# Patient Record
Sex: Male | Born: 1965 | Race: White | Hispanic: No | Smoking: Never smoker
Health system: Southern US, Community
[De-identification: ages and names within clinical notes are randomized; demographics above are authoritative.]

## PROBLEM LIST (undated history)

## (undated) DIAGNOSIS — N529 Male erectile dysfunction, unspecified: Secondary | ICD-10-CM

## (undated) DIAGNOSIS — R0602 Shortness of breath: Secondary | ICD-10-CM

## (undated) DIAGNOSIS — R5383 Other fatigue: Secondary | ICD-10-CM

## (undated) DIAGNOSIS — E291 Testicular hypofunction: Secondary | ICD-10-CM

## (undated) DIAGNOSIS — I429 Cardiomyopathy, unspecified: Secondary | ICD-10-CM

## (undated) DIAGNOSIS — E559 Vitamin D deficiency, unspecified: Secondary | ICD-10-CM

## (undated) DIAGNOSIS — L03115 Cellulitis of right lower limb: Secondary | ICD-10-CM

## (undated) DIAGNOSIS — R252 Cramp and spasm: Secondary | ICD-10-CM

## (undated) DIAGNOSIS — L02415 Cutaneous abscess of right lower limb: Secondary | ICD-10-CM

## (undated) DIAGNOSIS — I10 Essential (primary) hypertension: Secondary | ICD-10-CM

## (undated) DIAGNOSIS — E669 Obesity, unspecified: Secondary | ICD-10-CM

## (undated) DIAGNOSIS — M25469 Effusion, unspecified knee: Secondary | ICD-10-CM

## (undated) DIAGNOSIS — M1990 Unspecified osteoarthritis, unspecified site: Secondary | ICD-10-CM

## (undated) DIAGNOSIS — R7303 Prediabetes: Secondary | ICD-10-CM

## (undated) DIAGNOSIS — G4733 Obstructive sleep apnea (adult) (pediatric): Secondary | ICD-10-CM

## (undated) DIAGNOSIS — I499 Cardiac arrhythmia, unspecified: Secondary | ICD-10-CM

## (undated) DIAGNOSIS — M2550 Pain in unspecified joint: Secondary | ICD-10-CM

## (undated) DIAGNOSIS — E119 Type 2 diabetes mellitus without complications: Secondary | ICD-10-CM

## (undated) DIAGNOSIS — E538 Deficiency of other specified B group vitamins: Secondary | ICD-10-CM

## (undated) DIAGNOSIS — M791 Myalgia, unspecified site: Secondary | ICD-10-CM

## (undated) DIAGNOSIS — I4891 Unspecified atrial fibrillation: Secondary | ICD-10-CM

## (undated) HISTORY — DX: Cramp and spasm: R25.2

## (undated) HISTORY — DX: Deficiency of other specified B group vitamins: E53.8

## (undated) HISTORY — DX: Cardiomyopathy, unspecified: I42.9

## (undated) HISTORY — DX: Unspecified atrial fibrillation: I48.91

## (undated) HISTORY — DX: Male erectile dysfunction, unspecified: N52.9

## (undated) HISTORY — PX: EYE SURGERY: SHX253

## (undated) HISTORY — DX: Obesity, unspecified: E66.9

## (undated) HISTORY — DX: Effusion, unspecified knee: M25.469

## (undated) HISTORY — DX: Myalgia, unspecified site: M79.10

## (undated) HISTORY — DX: Pain in unspecified joint: M25.50

## (undated) HISTORY — DX: Vitamin D deficiency, unspecified: E55.9

## (undated) HISTORY — DX: Other fatigue: R53.83

## (undated) HISTORY — DX: Prediabetes: R73.03

## (undated) HISTORY — DX: Type 2 diabetes mellitus without complications: E11.9

## (undated) HISTORY — DX: Testicular hypofunction: E29.1

## (undated) HISTORY — DX: Obstructive sleep apnea (adult) (pediatric): G47.33

## (undated) HISTORY — DX: Shortness of breath: R06.02

## (undated) HISTORY — DX: Essential (primary) hypertension: I10

---
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
## 2000-06-19 ENCOUNTER — Emergency Department (HOSPITAL_COMMUNITY): Admission: EM | Admit: 2000-06-19 | Discharge: 2000-06-19 | Payer: Self-pay | Admitting: Emergency Medicine

## 2003-08-12 ENCOUNTER — Emergency Department (HOSPITAL_COMMUNITY): Admission: EM | Admit: 2003-08-12 | Discharge: 2003-08-12 | Payer: Self-pay | Admitting: Emergency Medicine

## 2006-05-13 ENCOUNTER — Ambulatory Visit: Payer: Self-pay | Admitting: Internal Medicine

## 2006-07-08 ENCOUNTER — Ambulatory Visit: Payer: Self-pay | Admitting: Internal Medicine

## 2006-07-15 ENCOUNTER — Ambulatory Visit: Payer: Self-pay | Admitting: Internal Medicine

## 2007-01-01 ENCOUNTER — Ambulatory Visit: Payer: Self-pay | Admitting: Family Medicine

## 2008-02-08 ENCOUNTER — Ambulatory Visit: Payer: Self-pay | Admitting: Internal Medicine

## 2008-02-08 DIAGNOSIS — R0609 Other forms of dyspnea: Secondary | ICD-10-CM

## 2008-02-08 DIAGNOSIS — I10 Essential (primary) hypertension: Secondary | ICD-10-CM | POA: Insufficient documentation

## 2008-02-08 DIAGNOSIS — R0989 Other specified symptoms and signs involving the circulatory and respiratory systems: Secondary | ICD-10-CM | POA: Insufficient documentation

## 2008-06-14 ENCOUNTER — Ambulatory Visit: Payer: Self-pay | Admitting: Internal Medicine

## 2008-06-14 LAB — CONVERTED CEMR LAB
ALT: 35 units/L (ref 0–53)
AST: 25 units/L (ref 0–37)
Basophils Absolute: 0 10*3/uL (ref 0.0–0.1)
Basophils Relative: 0.2 % (ref 0.0–3.0)
CO2: 30 meq/L (ref 19–32)
Chloride: 105 meq/L (ref 96–112)
Creatinine, Ser: 1.1 mg/dL (ref 0.4–1.5)
Eosinophils Relative: 1.6 % (ref 0.0–5.0)
LDL Cholesterol: 89 mg/dL (ref 0–99)
Leukocytes, UA: NEGATIVE
Lymphocytes Relative: 23 % (ref 12.0–46.0)
MCHC: 34.9 g/dL (ref 30.0–36.0)
Monocytes Relative: 7.8 % (ref 3.0–12.0)
Neutrophils Relative %: 67.4 % (ref 43.0–77.0)
Nitrite: NEGATIVE
RBC: 4.9 M/uL (ref 4.22–5.81)
Specific Gravity, Urine: 1.02 (ref 1.000–1.03)
TSH: 2.28 microintl units/mL (ref 0.35–5.50)
Total Bilirubin: 1.1 mg/dL (ref 0.3–1.2)
Total CHOL/HDL Ratio: 4
Urobilinogen, UA: 0.2 (ref 0.0–1.0)
WBC: 4.5 10*3/uL (ref 4.5–10.5)

## 2008-06-19 ENCOUNTER — Ambulatory Visit: Payer: Self-pay | Admitting: Internal Medicine

## 2009-02-26 ENCOUNTER — Telehealth: Payer: Self-pay | Admitting: Internal Medicine

## 2010-03-17 ENCOUNTER — Telehealth (INDEPENDENT_AMBULATORY_CARE_PROVIDER_SITE_OTHER): Payer: Self-pay | Admitting: *Deleted

## 2010-03-24 ENCOUNTER — Ambulatory Visit: Payer: Self-pay | Admitting: Internal Medicine

## 2010-03-24 DIAGNOSIS — R5381 Other malaise: Secondary | ICD-10-CM | POA: Insufficient documentation

## 2010-03-24 DIAGNOSIS — I872 Venous insufficiency (chronic) (peripheral): Secondary | ICD-10-CM | POA: Insufficient documentation

## 2010-03-24 DIAGNOSIS — R5383 Other fatigue: Secondary | ICD-10-CM | POA: Insufficient documentation

## 2010-03-24 DIAGNOSIS — R609 Edema, unspecified: Secondary | ICD-10-CM | POA: Insufficient documentation

## 2010-07-26 ENCOUNTER — Emergency Department (HOSPITAL_COMMUNITY): Admission: EM | Admit: 2010-07-26 | Discharge: 2010-07-26 | Payer: Self-pay | Admitting: Emergency Medicine

## 2010-09-15 ENCOUNTER — Ambulatory Visit: Payer: Self-pay | Admitting: Internal Medicine

## 2010-09-16 ENCOUNTER — Encounter: Payer: Self-pay | Admitting: Internal Medicine

## 2010-09-22 ENCOUNTER — Ambulatory Visit: Payer: Self-pay | Admitting: Internal Medicine

## 2010-09-22 ENCOUNTER — Encounter: Payer: Self-pay | Admitting: Internal Medicine

## 2010-09-22 DIAGNOSIS — R7309 Other abnormal glucose: Secondary | ICD-10-CM | POA: Insufficient documentation

## 2010-10-01 ENCOUNTER — Ambulatory Visit: Payer: Self-pay | Admitting: Pulmonary Disease

## 2010-11-04 NOTE — Assessment & Plan Note (Signed)
Summary: f/u appt/#/cd   Vital Signs:  Patient profile:   45 year old male Height:      70 inches (177.80 cm) Weight:      341.50 pounds (155.23 kg) BMI:     49.18 O2 Sat:      95 % on Room air Temp:     96.8 degrees F (36 degrees C) oral Pulse rate:   71 / minute BP sitting:   130 / 84  (left arm) Cuff size:   large  Vitals Entered By: Lucious Groves (March 24, 2010 8:27 AM)  O2 Flow:  Room air CC: follow-up visit/refill micardis./kb Is Patient Diabetic? No Pain Assessment Patient in pain? no        CC:  follow-up visit/refill micardis./kb.  History of Present Illness: F/u HTN, obesity, fatigue  Current Medications (verified): 1)  Micardis Hct 80-12.5 Mg  Tabs (Telmisartan-Hctz) .[name].. 1 Once Daily 2)  Vitamin D3 1000 Unit  Tabs (Cholecalciferol) .[name].. 1 By Mouth Daily  Allergies (verified): 1)  Hydrochlorothiazide  Past History:  Past Medical History: Last updated: 02/08/2008 Hypertension Obesity ED Hypogonadism  Family History: Last updated: 02/08/2008 F MI 42 Cousin MI  Social History: Last updated: 06/19/2008 Occupation: [employer] Single, moved in with girlfriend Never Smoked Regular exercise-yes  Past Surgical History: Denies surgical history  Review of Systems       The patient complains of dyspnea on exertion.  The patient denies weight loss, weight gain, prolonged cough, and melena.         Tired  Physical Exam  General:  overweight-appearing.   Nose:  External nasal examination shows no deformity or inflammation. Nasal mucosa are pink and moist without lesions or exudates. Mouth:  Oral mucosa and oropharynx without lesions or exudates.  Teeth in good repair. Small oropharynx Neck:  No deformities, masses, or tenderness noted. Lungs:  Normal respiratory effort, chest expands symmetrically. Lungs are clear to auscultation, no crackles or wheezes. Heart:  Normal rate and regular rhythm. S1 and S2 normal without gallop, murmur, click, rub or  other extra sounds. Abdomen:  Obese S/NT Msk:  No deformity or scoliosis noted of thoracic or lumbar spine.   Extremities:  trace left pedal edema and trace right pedal edema.   Neurologic:  No cranial nerve deficits noted. Station and gait are normal. Plantar reflexes are down-going bilaterally. DTRs are symmetrical throughout. Sensory, motor and coordinative functions appear intact. Skin:  Intact without suspicious lesions or rashes Hyperpigm ankles   Impression & Recommendations:  Problem # 1:  HYPERTENSION (ICD-401.9) Assessment Unchanged  The following medications were removed from the medication list:    Micardis Hct 80-12.5 Mg Tabs (Telmisartan-hctz) .[name]... 1 once daily His updated medication list for this problem includes:    Hyzaar 100-12.5 Mg Tabs (Losartan potassium-hctz) .[name]... 1 by mouth qd  BP today: 130/84 Prior BP: 120/70 (06/19/2008)  Labs Reviewed: K+: 3.8 (06/14/2008) Creat: : 1.1 (06/14/2008)   Chol: 137 (06/14/2008)   HDL: 34.5 (06/14/2008)   LDL: 89 (06/14/2008)   TG: 69 (06/14/2008)  Problem # 2:  OBESITY, MORBID (ICD-278.01) Assessment: Unchanged See "Patient Instructions".   Problem # 3:  SNORING (ICD-786.09) Assessment: Unchanged Denies OSA. Declined sleep study The following medications were removed from the medication list:    Micardis Hct 80-12.5 Mg Tabs (Telmisartan-hctz) .[name]... 1 once daily His updated medication list for this problem includes:    Hyzaar 100-12.5 Mg Tabs (Losartan potassium-hctz) .[name]... 1 by mouth qd  Problem # 4:  FATIGUE (  ICD-780.79) Assessment: Unchanged Poss due to #2,3 Check testost  Problem # 5:  EDEMA (ICD-782.3) due to #6 Assessment: New  The following medications were removed from the medication list:    Micardis Hct 80-12.5 Mg Tabs (Telmisartan-hctz) .[name]... 1 once daily His updated medication list for this problem includes:    Hyzaar 100-12.5 Mg Tabs (Losartan potassium-hctz) .[name]... 1 by mouth qd  Problem # 6:   VENOUS INSUFFICIENCY, CHRONIC (ICD-459.81) due to #2 Assessment: Deteriorated  Complete Medication List: 1)  Vitamin D3 1000 Unit Tabs (Cholecalciferol) .[name].. 1 by mouth daily 2)  Hyzaar 100-12.5 Mg Tabs (Losartan potassium-hctz) .[name].. 1 by mouth qd 3)  Labs  .... Cbc, tsh, bmet, hepatic panel, ua, lipids dx: v70.0, 401.1  Patient Instructions: 1)  Please schedule a follow-up appointment in 6 months well w/labs. 2)  Try to eat more raw plant food, fresh and dry fruit, raw almonds, leafy vegetables, whole foods and less red meat, less animal fat. Poultry and fish is better for you than pork and beef. Avoid processed foods (canned soups, hot dogs, sausage, bacon , frozen dinners). Avoid corn syrup, high fructose syrup or aspartam  containing drinks. Honey, Agave and Stevia are better sweeteners. Make your own  dressing with olive oil, wine vinegar, lemon juce, garlic etc. for your salads. Prescriptions: LABS CBC, TSH, BMET, Hepatic panel, UA, Lipids Dx: V70.0, 401.1  #1 x 0   Entered and Authorized by:   Tresa Garter MD   Signed by:   Tresa Garter MD on 03/24/2010   Method used:   Print then Give to Patient   RxID:   [id number] HYZAAR 100-12.5 MG TABS (LOSARTAN POTASSIUM-HCTZ) 1 by mouth qd  #90 x 3   Entered and Authorized by:   Tresa Garter MD   Signed by:   Tresa Garter MD on 03/24/2010   Method used:   Electronically to        [facility] [phone]* (retail)       [address]       [place], [place]  [address]       Ph: [phone]       Fax: [phone]   RxID:   [id number]

## 2010-11-04 NOTE — Progress Notes (Signed)
----   Converted from flag ---- ---- 03/17/2010 9:28 AM, Verdell Face wrote: Roque Lias,  Pt out of mycardis,. can he get some to get him through to next appt. [phone] / [facility] [address]  ROCK, SOBOL [place]  [phone] thanks! cheryl ------------------------------

## 2010-11-06 NOTE — Assessment & Plan Note (Signed)
Summary: 6 MTH PHYSICAL--STC   Vital Signs:  Patient profile:   45 year old male Height:      70 inches Weight:      361 pounds BMI:     51.99 Temp:     97.6 degrees F oral Pulse rate:   76 / minute Pulse rhythm:   regular Resp:     16 per minute BP sitting:   130 / 90  (left arm) Cuff size:   large  Vitals Entered By: Lanier Prude, Beverly Gust) (September 22, 2010 8:48 AM) CC: CPX Is Patient Diabetic? No   CC:  CPX.  History of Present Illness: The patient presents for a preventive health examination   Current Medications (verified): 1)  Vitamin D3 1000 Unit  Tabs (Cholecalciferol) .[name].. 1 By Mouth Daily 2)  Hyzaar 100-12.5 Mg Tabs (Losartan Potassium-Hctz) .[name].. 1 By Mouth Qd 3)  Labs .... Cbc, Tsh, Bmet, Hepatic Panel, Ua, Lipids Dx: V70.0, 401.1  Allergies (verified): 1)  Hydrochlorothiazide  Past History:  Past Medical History: Last updated: 02/08/2008 Hypertension Obesity ED Hypogonadism  Past Surgical History: Last updated: 03/24/2010 Denies surgical history  Family History: Last updated: 02/08/2008 F MI 32 Cousin MI  Social History: Occupation: [employer] Got married 08/2010 Never Smoked Regular exercise-yes  Review of Systems       The patient complains of weight gain.  The patient denies anorexia, fever, weight loss, vision loss, decreased hearing, hoarseness, chest pain, syncope, dyspnea on exertion, peripheral edema, prolonged cough, headaches, hemoptysis, abdominal pain, melena, hematochezia, severe indigestion/heartburn, hematuria, incontinence, genital sores, muscle weakness, suspicious skin lesions, transient blindness, difficulty walking, depression, unusual weight change, abnormal bleeding, enlarged lymph nodes, angioedema, and testicular masses.         stress  Physical Exam  General:  overweight-appearing.   Head:  Normocephalic and atraumatic without obvious abnormalities. No apparent alopecia or balding. Eyes:  No corneal or  conjunctival inflammation noted. EOMI. Perrla. Ears:  External ear exam shows no significant lesions or deformities.  Otoscopic examination reveals clear canals, tympanic membranes are intact bilaterally without bulging, retraction, inflammation or discharge. Hearing is grossly normal bilaterally. Nose:  External nasal examination shows no deformity or inflammation. Nasal mucosa are pink and moist without lesions or exudates. Mouth:  Oral mucosa and oropharynx without lesions or exudates.  Teeth in good repair. Neck:  No deformities, masses, or tenderness noted. Breasts:  gynecomastia.   Lungs:  Normal respiratory effort, chest expands symmetrically. Lungs are clear to auscultation, no crackles or wheezes. Heart:  Normal rate and regular rhythm. S1 and S2 normal without gallop, murmur, click, rub or other extra sounds. Abdomen:  Obese S/NT Msk:  No deformity or scoliosis noted of thoracic or lumbar spine.   Extremities:  trace left pedal edema and trace right pedal edema.   Neurologic:  No cranial nerve deficits noted. Station and gait are normal. Plantar reflexes are down-going bilaterally. DTRs are symmetrical throughout. Sensory, motor and coordinative functions appear intact. Skin:  Intact without suspicious lesions or rashes Hyperpigm ankles Cervical Nodes:  No lymphadenopathy noted Psych:  Cognition and judgment appear intact. Alert and cooperative with normal attention span and concentration. No apparent delusions, illusions, hallucinations   Impression & Recommendations:  Problem # 1:  WELL ADULT EXAM (ICD-V70.0) Assessment New The labs were reviewed with the patient.  Health and age related issues were discussed. Available screening tests and vaccinations were discussed as well. Healthy life style including good diet and exercise was discussed.  Orders: EKG  w/ Interpretation (93000) nl  Problem # 2:  OBESITY, MORBID (ICD-278.01) Assessment: Deteriorated  Lap band discussed  again  Orders: Sleep Disorder Referral (Sleep Disorder)  Problem # 3:  HYPERTENSION (ICD-401.9) Assessment: Unchanged  His updated medication list for this problem includes:    Hyzaar 100-12.5 Mg Tabs (Losartan potassium-hctz) .[name]... 1 by mouth qd  BP today: 130/90 Prior BP: 130/84 (03/24/2010)  Labs Reviewed: K+: 3.8 (06/14/2008) Creat: : 1.1 (06/14/2008)   Chol: 137 (06/14/2008)   HDL: 34.5 (06/14/2008)   LDL: 89 (06/14/2008)   TG: 69 (06/14/2008)  Problem # 4:  HYPERGLYCEMIA (ICD-790.29) Assessment: Unchanged  His updated medication list for this problem includes:    Metformin Hcl 500 Mg Tabs (Metformin hcl) .[name]... 1 by mouth once daily to start A1c 5.7%  Problem # 5:  SNORING (ICD-786.09) Assessment: Deteriorated  His updated medication list for this problem includes:    Hyzaar 100-12.5 Mg Tabs (Losartan potassium-hctz) .[name]... 1 by mouth qd  Orders: Sleep Disorder Referral (Sleep Disorder)  Complete Medication List: 1)  Vitamin D3 1000 Unit Tabs (Cholecalciferol) .[name].. 1 by mouth daily 2)  Hyzaar 100-12.5 Mg Tabs (Losartan potassium-hctz) .[name].. 1 by mouth qd 3)  Labs  .... Cbc, tsh, bmet, hepatic panel, ua, lipids dx: v70.0, 401.1 4)  Metformin Hcl 500 Mg Tabs (Metformin hcl) .[name].. 1 by mouth qd  Contraindications/Deferment of Procedures/Staging:    Test/Procedure: FLU VAX    Reason for deferment: patient declined   Patient Instructions: 1)  Please schedule a follow-up appointment in 6 months. Prescriptions: METFORMIN HCL 500 MG TABS (METFORMIN HCL) 1 by mouth qd  #90 x 3   Entered and Authorized by:   Tresa Garter MD   Signed by:   Tresa Garter MD on 09/22/2010   Method used:   Electronically to        [facility] [phone]* (retail)       [address]       [place], [place]  [address]       Ph: [phone]       Fax: [phone]   RxID:   [id number] HYZAAR 100-12.5 MG TABS (LOSARTAN POTASSIUM-HCTZ) 1 by mouth qd   #90 x 3   Entered and Authorized by:   Tresa Garter MD   Signed by:   Tresa Garter MD on 09/22/2010   Method used:   Electronically to        [facility] [phone]* (retail)       [address]       [place], [place]  [address]       Ph: [phone]       Fax: [phone]   RxID:   [id number]    Orders Added: 1)  EKG w/ Interpretation [93000] 2)  Sleep Disorder Referral [Sleep Disorder] 3)  Est. Patient age 63-64 [phone]

## 2010-12-12 ENCOUNTER — Telehealth: Payer: Self-pay | Admitting: Internal Medicine

## 2010-12-16 NOTE — Progress Notes (Signed)
Summary: RF -   Phone Note Refill Request   Refills Requested: Medication #1:  HYZAAR 100-12.5 MG TABS 1 by mouth qd  Medication #2:  METFORMIN HCL 500 MG TABS 1 by mouth qd. [facility] [place] ?   Initial call taken by: Lamar Sprinkles, CMA,  December 12, 2010 2:09 PM    Additional Follow-up for Phone Call Additional follow up Details #2::    ok x 12 months  Follow-up by: Tresa Garter MD,  December 12, 2010 5:46 PM  Prescriptions: METFORMIN HCL 500 MG TABS (METFORMIN HCL) 1 by mouth qd  #90 x 3   Entered by:   Lamar Sprinkles, CMA   Authorized by:   Tresa Garter MD   Signed by:   Lamar Sprinkles, CMA on 12/12/2010   Method used:   Electronically to        [facility]* (mail-order)       [address]       [place], [place]  [address]       Ph: [phone]       Fax: [phone]   RxID:   [id number] LOVFIE 100-12.5 MG TABS (LOSARTAN POTASSIUM-HCTZ) 1 by mouth qd  #90 x 3   Entered by:   Lamar Sprinkles, CMA   Authorized by:   Tresa Garter MD   Signed by:   Lamar Sprinkles, CMA on 12/12/2010   Method used:   Electronically to        [facility] (mail-order)       [address]       [place], [place]  [address]       Ph: [phone]       Fax: [phone]   RxID:   [id number]

## 2011-01-02 ENCOUNTER — Telehealth: Payer: Self-pay | Admitting: *Deleted

## 2011-01-02 DIAGNOSIS — R0683 Snoring: Secondary | ICD-10-CM

## 2011-01-02 MED ORDER — LOSARTAN POTASSIUM-HCTZ 100-12.5 MG PO TABS: 1.0000 | ORAL_TABLET | Freq: Every day | ORAL | Status: DC

## 2011-01-02 MED ORDER — METFORMIN HCL 500 MG PO TABS: 500.0000 mg | ORAL_TABLET | Freq: Every day | ORAL | Status: DC

## 2011-01-02 NOTE — Telephone Encounter (Signed)
Left vm for wife, req were completed

## 2011-01-02 NOTE — Telephone Encounter (Signed)
Ok sleep study

## 2011-01-02 NOTE — Telephone Encounter (Signed)
1. Needs refills of 2 meds to be resent to [facility]. Done 2. Req referral for sleep study. Pt was referred  In the past but ins did not cover.

## 2011-02-05 ENCOUNTER — Ambulatory Visit (HOSPITAL_BASED_OUTPATIENT_CLINIC_OR_DEPARTMENT_OTHER): Payer: 59 | Attending: Internal Medicine

## 2011-02-05 DIAGNOSIS — I491 Atrial premature depolarization: Secondary | ICD-10-CM | POA: Insufficient documentation

## 2011-02-05 DIAGNOSIS — G4733 Obstructive sleep apnea (adult) (pediatric): Secondary | ICD-10-CM | POA: Insufficient documentation

## 2011-02-20 NOTE — Assessment & Plan Note (Signed)
[facility]                             PRIMARY CARE OFFICE NOTE   Dakota Hernandez, Dakota Hernandez                  MRN:          [id number]  DATE:07/15/2006                            DOB:          09-Nov-1965    The patient is a 45 year old male presents for a wellness examination.   PAST MEDICAL HISTORY:  1. Hypertension.  2. Erectile dysfunction.   FAMILY HISTORY:  Father died with MI in his 75s.  One cousin had an MI.  Mother is still living, using a walker likely due to arthritis.   SOCIAL HISTORY:  He is single, works at [employer], does not smoke or drink.   ALLERGIES:  NONE.   CURRENT MEDICATIONS:  1. Micardis/HCT 80/12.5.  2. Cialis p.r.n.   DRUG INTOLERANCE:  HCTZ CAUSED ERECTILE DYSFUNCTION.   REVIEW OF SYSTEMS:  He is trying to loose weight by controlling his  portions.  Denies chest pain or shortness of breath.  No syncope, no  neurologic complaints, denies being depressed.  The rest is negative.   PHYSICAL EXAMINATION:  VITAL SIGNS:  Blood pressure 144/88, pulse 73, temp  96.7, weight is 333 pounds (was 342).  GENERAL:  He is overweight.  HEENT:  Moist mucosa.  NECK:  Supple.  No thyromegaly or bruit.  LUNGS:  Clear to auscultation percussion.  No wheezes or rales.  HEART:  S1 S2.  No murmur.  No gallop.  ABDOMEN:  Soft nontender.  No organomegaly is felt.  LOWER EXTREMITIES:  Without edema.  NEUROLOGIC:  He is alert, oriented, cooperative.  Denies being depressed.  GENITOURINARY:  Testicles normal without atrophy, normal size.  No hernia.  SKIN:  Clear with some scars likely left after furuncles on the abdomen.   LABORATORY:  July 08, 2006, CBC normal.  C-MET normal.  Cholesterol 176,  LDL 124, HDL 33.  TSH normal.  Urinalysis normal.  Testosterone 267.  EKG  today is normal.   ASSESSMENT/PLAN:  1. Normal wellness examination. Age/health related issues discussed.      Healthy lifestyle discussed.  He has started to lose  weight.  I gave      him information about ALLI.  He denies snoring or sleep apnea symptoms.      He is planning to loose more weight.  Repeat exam in 12 months with      labs.  2. Family history of coronary disease.  I asked him to take fish oil 1-2 a      day and aspirin 81 mg a day.  3. Hypogonadism with previously negative workup for a secondary cause      likely related to his obesity.  He is not interested in replacement      therapy.  We will recheck in 1 years.  He will plan to loose more      weight.  4. Hypertension.  Restart blood pressure medication today.            ______________________________  Georgina Quint. Plotnikov, MD      AVP/MedQ  DD:  07/15/2006  DT:  07/16/2006  Job #:  [id number]

## 2011-02-26 ENCOUNTER — Telehealth: Payer: Self-pay | Admitting: *Deleted

## 2011-02-26 DIAGNOSIS — I491 Atrial premature depolarization: Secondary | ICD-10-CM

## 2011-02-26 DIAGNOSIS — G4733 Obstructive sleep apnea (adult) (pediatric): Secondary | ICD-10-CM

## 2011-02-26 NOTE — Telephone Encounter (Signed)
Pt's wife called - Pt has ov coming up and needs written rx? I left vm for pt to call back to clarify if he needs written rx OR only lab order for here??

## 2011-02-26 NOTE — Telephone Encounter (Signed)
Pt returned call. He needs written lab order so he can take to [facility]. Please advise, what labs do you want pt to have?

## 2011-02-26 NOTE — Telephone Encounter (Signed)
1.Ok Labs CBC, TSH Testost Vit D CMET Dx fatigue 2. Abn sleep test -- we will ref to Dr Shelle Iron Thx

## 2011-02-26 NOTE — Procedures (Signed)
NAMEBAY, WAYSON             ACCOUNT NO.:  [id number]  MEDICAL RECORD NO.:  [id number]          PATIENT TYPE:  OUT  LOCATION:  SLEEP CENTER                 FACILITY:  [facility]  PHYSICIAN:  Barbaraann Share, MD,FCCPDATE OF BIRTH:  06-Jun-1967  DATE OF STUDY:  02/05/2011                           NOCTURNAL POLYSOMNOGRAM  REFERRING PHYSICIAN:  [name]  INDICATION FOR STUDY:  Hypersomnia with sleep apnea.  EPWORTH SCORE:  8..  SLEEP ARCHITECTURE:  The patient had a total sleep time of 240 minutes with no slow wave sleep and only 23 minutes of REM.  Sleep onset latency was normal at 24 minutes, and REM onset was dilated 258 minutes.  Sleep efficiency was poor at 63%.  RESPIRATORY DATA:  The patient was found to have 41 apneas and 111 obstructive hypopneas, giving him an apnea/hypopnea index of 38 events per hour.  The events occurred in all body positions, but were most common during supine sleep.  There was moderate snoring noted throughout.  OXYGEN DATA:  There was O2 desaturation as low as 74% with the patient's obstructive events.  CARDIAC DATA:  Occasional PAC noted but no clinically significant arrhythmias were seen.  MOVEMENT/PARASOMNIA:  There were no significant leg jerks or other abnormal behaviors noted.  IMPRESSION/RECOMMENDATIONS: 1. Moderate to severe obstructive sleep apnea/hypopnea syndrome with     an AHI of 38 events per hour and O2 desaturation as low as 74%.     Treatment for this degree of sleep apnea should focus primarily on     weight loss as well as CPAP. 2. Occasional PAC noted, but no clinically significant arrhythmias     were seen.     Barbaraann Share, MD,FCCP Diplomate, American Board of Sleep Medicine Electronically Signed    [facility]/MEDQ  D:  02/26/2011 07:53:31  T:  02/26/2011 19:40:32  Job:  [id number]

## 2011-02-26 NOTE — Telephone Encounter (Signed)
Pending signature, Left vm for pt regarding pickup of order and referral

## 2011-03-05 ENCOUNTER — Encounter: Payer: Self-pay | Admitting: Internal Medicine

## 2011-03-12 ENCOUNTER — Encounter: Payer: Self-pay | Admitting: Pulmonary Disease

## 2011-03-13 ENCOUNTER — Ambulatory Visit (INDEPENDENT_AMBULATORY_CARE_PROVIDER_SITE_OTHER): Payer: 59 | Admitting: Pulmonary Disease

## 2011-03-13 ENCOUNTER — Encounter: Payer: Self-pay | Admitting: Pulmonary Disease

## 2011-03-13 VITALS — BP 112/78 | HR 70 | Temp 97.4°F | Ht 70.0 in | Wt 347.4 lb

## 2011-03-13 DIAGNOSIS — G4733 Obstructive sleep apnea (adult) (pediatric): Secondary | ICD-10-CM | POA: Insufficient documentation

## 2011-03-13 NOTE — Progress Notes (Signed)
  Subjective:    Patient ID: Dakota Hernandez, male    DOB: [birth year], 45 y.o.   MRN: [id number]  HPI The pt is a 44y/o male who I have been asked to see for management of severe osa.  He recently underwent npsg, which showed AHI 38/hr with desat to 74%.  The pt's history is significant for: -loud snoring during the night, but no one has commented on an abnormal breathing pattern during sleep. -denies frequent awakenings, but does have nonrestorative sleep at times.   -occasional sleep pressure during the day with inactivity, and denies sleepiness watching tv or movies in the evenings. -denies sleepiness driving -weight is stable over the last 2 yrs.  Sleep Questionnaire: What time do you typically go to bed?( Between what hours) 11 to 11:30 pm How long does it take you to fall asleep? 5 to 10 mins How many times during the night do you wake up? 2 What time do you get out of bed to start your day? 0600 Do you drive or operate heavy machinery in your occupation? No How much has your weight changed (up or down) over the past two years? (In pounds) Have you ever had a sleep study before? Yes If yes, location of study? [facility] If yes, date of study? May 2012 Do you currently use CPAP? No Do you wear oxygen at any time?     Review of Systems  Constitutional: Negative for fever and unexpected weight change.  HENT: Negative for ear pain, nosebleeds, congestion, sore throat, rhinorrhea, sneezing, trouble swallowing, dental problem, postnasal drip and sinus pressure.   Eyes: Negative for redness and itching.  Respiratory: Negative for cough, chest tightness, shortness of breath and wheezing.   Cardiovascular: Positive for leg swelling. Negative for palpitations.  Gastrointestinal: Negative for nausea and vomiting.  Genitourinary: Negative for dysuria.  Musculoskeletal: Positive for joint swelling.  Skin: Negative for rash.  Neurological: Negative for headaches.  Hematological: Does not bruise/bleed  easily.  Psychiatric/Behavioral: Negative for dysphoric mood. The patient is not nervous/anxious.        Objective:   Physical Exam Constitutional:  Morbidly obese male , no acute distress  HENT:  Nares patent without discharge, turbinate hypertrophy noted  Oropharynx without exudate, very significant soft tissue redundancy posteriorly with elongation of soft palate and  uvula.  Eyes:  Perrla, eomi, no scleral icterus  Neck:  No JVD, no TMG  Cardiovascular:  Normal rate, regular rhythm, no rubs or gallops.  No murmurs        Intact distal pulses  Pulmonary :  Normal breath sounds, no stridor or respiratory distress   No rales, rhonchi, or wheezing  Abdominal:  Soft, nondistended, bowel sounds present.  No tenderness noted.   Musculoskeletal: mild lower extremity edema noted.  Lymph Nodes:  No cervical lymphadenopathy noted  Skin:  No cyanosis noted  Neurologic:  Appears sleepy, moves all 4 extremities without obvious deficit.         Assessment & Plan:

## 2011-03-13 NOTE — Patient Instructions (Signed)
Will start on cpap at moderate pressure level.  Please call if issues with tolerance Work on weight reduction.

## 2011-03-13 NOTE — Assessment & Plan Note (Signed)
The pt has severe osa by his recent sleep study, and I suspect he is much more symptomatic than he is describing.  I have had a long discussion with the pt about sleep apnea, including its impact on QOL and CV health.  I think he would benefit the most from cpap while working on weight loss.  He is considering bariatric surgery to help him lose weight.  I will set the patient up on cpap at a moderate pressure level to allow for desensitization, and will troubleshoot the device over the next 4-6weeks if needed.  The pt is to call me if having issues with tolerance.  Will then optimize the pressure once patient is able to wear cpap on a consistent basis.

## 2011-03-17 ENCOUNTER — Encounter: Payer: Self-pay | Admitting: Internal Medicine

## 2011-03-20 ENCOUNTER — Encounter: Payer: Self-pay | Admitting: Pulmonary Disease

## 2011-03-23 ENCOUNTER — Ambulatory Visit: Payer: Self-pay | Admitting: Internal Medicine

## 2011-03-27 ENCOUNTER — Encounter: Payer: Self-pay | Admitting: Internal Medicine

## 2011-03-27 ENCOUNTER — Ambulatory Visit (INDEPENDENT_AMBULATORY_CARE_PROVIDER_SITE_OTHER): Payer: 59 | Admitting: Internal Medicine

## 2011-03-27 DIAGNOSIS — G4733 Obstructive sleep apnea (adult) (pediatric): Secondary | ICD-10-CM

## 2011-03-27 DIAGNOSIS — E291 Testicular hypofunction: Secondary | ICD-10-CM

## 2011-03-27 DIAGNOSIS — E559 Vitamin D deficiency, unspecified: Secondary | ICD-10-CM | POA: Insufficient documentation

## 2011-03-27 MED ORDER — TESTOSTERONE MICRONIZED CRYS: CRYSTALS | Status: DC

## 2011-03-27 MED ORDER — VITAMIN D3 1.25 MG (50000 UT) PO CAPS: 1.0000 | ORAL_CAPSULE | ORAL | Status: DC

## 2011-03-27 NOTE — Progress Notes (Signed)
  Subjective:    Patient ID: Dakota Hernandez, male    DOB: 29-Jan-1966, 45 y.o.   MRN: [id number]  HPI  F/u abn labs, obesity  Review of Systems  Constitutional: Negative for appetite change, fatigue and unexpected weight change.  HENT: Negative for nosebleeds, congestion, sore throat, sneezing, trouble swallowing and neck pain.   Eyes: Negative for itching and visual disturbance.  Respiratory: Negative for cough.   Cardiovascular: Negative for chest pain, palpitations and leg swelling.  Gastrointestinal: Negative for nausea, diarrhea, blood in stool and abdominal distention.  Genitourinary: Negative for frequency and hematuria.  Musculoskeletal: Negative for back pain, joint swelling and gait problem.  Skin: Negative for rash.  Neurological: Negative for dizziness, tremors, speech difficulty and weakness.  Psychiatric/Behavioral: Negative for sleep disturbance, dysphoric mood and agitation. The patient is not nervous/anxious.        Objective:   Physical Exam  Constitutional: He is oriented to person, place, and time. He appears well-developed. He appears distressed.       Obese  HENT:  Mouth/Throat: Oropharynx is clear and moist.  Eyes: Conjunctivae are normal. Pupils are equal, round, and reactive to light.  Neck: Normal range of motion. No JVD present. No thyromegaly present.  Cardiovascular: Normal rate, regular rhythm, normal heart sounds and intact distal pulses.  Exam reveals no gallop and no friction rub.   No murmur heard. Pulmonary/Chest: Effort normal and breath sounds normal. No respiratory distress. He has no wheezes. He has no rales. He exhibits no tenderness.  Abdominal: Soft. Bowel sounds are normal. He exhibits no distension and no mass. There is no tenderness. There is no rebound and no guarding.  Musculoskeletal: Normal range of motion. He exhibits no edema and no tenderness.  Lymphadenopathy:    He has no cervical adenopathy.  Neurological: He is alert and  oriented to person, place, and time. He has normal reflexes. No cranial nerve deficit. He exhibits normal muscle tone. Coordination normal.  Skin: Skin is warm and dry. No rash noted.  Psychiatric: He has a normal mood and affect. His behavior is normal. Judgment and thought content normal.        Lab Results  Component Value Date   WBC 4.5 06/14/2008   HGB 15.4 06/14/2008   HCT 44.1 06/14/2008   PLT 212 06/14/2008   CHOL 137 06/14/2008   TRIG 69 06/14/2008   HDL 34.5* 06/14/2008   ALT 35 06/14/2008   AST 25 06/14/2008   NA 141 06/14/2008   K 3.8 06/14/2008   CL 105 06/14/2008   CREATININE 1.1 06/14/2008   BUN 10 06/14/2008   CO2 30 06/14/2008   TSH 2.28 06/14/2008     Assessment & Plan:

## 2011-03-27 NOTE — Patient Instructions (Signed)
Labs prior

## 2011-04-02 ENCOUNTER — Encounter: Payer: Self-pay | Admitting: Internal Medicine

## 2011-04-04 ENCOUNTER — Encounter: Payer: Self-pay | Admitting: Internal Medicine

## 2011-04-04 NOTE — Assessment & Plan Note (Signed)
Lap band surgery adviced

## 2011-04-04 NOTE — Assessment & Plan Note (Signed)
On CPAP now

## 2011-04-04 NOTE — Assessment & Plan Note (Signed)
Options to treat discussed. Work up discussed See Meds  Potential benefits of a long term testosterone use as well as potential risks  and complications were explained to the patient and were aknowledged.

## 2011-04-16 ENCOUNTER — Ambulatory Visit: Payer: 59 | Admitting: Pulmonary Disease

## 2011-05-12 ENCOUNTER — Encounter: Payer: Self-pay | Admitting: Pulmonary Disease

## 2011-05-12 ENCOUNTER — Ambulatory Visit (INDEPENDENT_AMBULATORY_CARE_PROVIDER_SITE_OTHER): Payer: 59 | Admitting: Pulmonary Disease

## 2011-05-12 VITALS — BP 132/84 | HR 97 | Temp 97.6°F | Ht 70.0 in | Wt 357.0 lb

## 2011-05-12 DIAGNOSIS — G4733 Obstructive sleep apnea (adult) (pediatric): Secondary | ICD-10-CM

## 2011-05-12 NOTE — Patient Instructions (Signed)
Will get cpap machine set on auto mode for next 2 weeks to optimize your pressure.  Will call with results Work on weight loss followup with me in 6mos.

## 2011-05-16 ENCOUNTER — Encounter: Payer: Self-pay | Admitting: Pulmonary Disease

## 2011-05-16 NOTE — Assessment & Plan Note (Signed)
The pt is doing well with cpap overall, and has seen improvement in his sleep and daytime alertness.  He is suffering from some stress and anxiety currently due to family health issues.  He is working thru mask leak issues.  I have explained to him we need to optimize his pressure, and have encouraged him to work on weight loss.  Care Plan:  At this point, will arrange for the patient's machine to be changed over to auto mode for 2 weeks to optimize their pressure.  I will review the downloaded data once sent by dme, and also evaluate for compliance, leaks, and residual osa.  I will call the patient and dme to discuss the results, and have the patient's machine set appropriately.  This will serve as the pt's cpap pressure titration.

## 2011-05-16 NOTE — Progress Notes (Signed)
  Subjective:    Patient ID: Dakota Hernandez, male    DOB: 1966/02/20, 45 y.o.   MRN: [id number]  HPI The pt comes in today for f/u of his osa.  He was started on cpap last visit at a moderate pressure level, and has done well overall.  He is wearing cpap compliantly, and noticed a big improvement in his sleep.  However, he has been under a lot of stress the past one week due to his wife's illness, and not sleeping as well.  He denies any issue with his pressure, but has had some mask leaks.  He is working thru this.    Review of Systems  Constitutional: Negative for fever and unexpected weight change.  HENT: Negative for ear pain, nosebleeds, congestion, sore throat, rhinorrhea, sneezing, trouble swallowing, dental problem, postnasal drip and sinus pressure.   Eyes: Negative for redness and itching.  Respiratory: Negative for cough, chest tightness, shortness of breath and wheezing.   Cardiovascular: Negative for palpitations and leg swelling.  Gastrointestinal: Negative for nausea and vomiting.  Genitourinary: Negative for dysuria.  Musculoskeletal: Negative for joint swelling.  Skin: Negative for rash.  Neurological: Negative for headaches.  Hematological: Does not bruise/bleed easily.  Psychiatric/Behavioral: Negative for dysphoric mood. The patient is not nervous/anxious.        Objective:   Physical Exam Obese male in nad No skin breakdown or pressure necrosis from cpap mask No purulence or discharge from nares Chest clear LE with no edema, no cyanosis noted.  Alert, does not appear sleepy, moves all 4        Assessment & Plan:

## 2011-05-28 ENCOUNTER — Encounter: Payer: Self-pay | Admitting: Internal Medicine

## 2011-05-28 ENCOUNTER — Ambulatory Visit (INDEPENDENT_AMBULATORY_CARE_PROVIDER_SITE_OTHER): Payer: 59 | Admitting: Internal Medicine

## 2011-05-28 DIAGNOSIS — R5383 Other fatigue: Secondary | ICD-10-CM

## 2011-05-28 DIAGNOSIS — R7309 Other abnormal glucose: Secondary | ICD-10-CM

## 2011-05-28 DIAGNOSIS — I10 Essential (primary) hypertension: Secondary | ICD-10-CM

## 2011-05-28 DIAGNOSIS — R5381 Other malaise: Secondary | ICD-10-CM

## 2011-05-28 DIAGNOSIS — E291 Testicular hypofunction: Secondary | ICD-10-CM

## 2011-05-28 NOTE — Progress Notes (Signed)
  Subjective:    Patient ID: Dakota Hernandez, male    DOB: November 18, 1965, 45 y.o.   MRN: [id number]  HPI  F/u hypogonadism, obesity and OSA  Review of Systems  Constitutional: Positive for fatigue (less tred) and unexpected weight change (lost wt). Negative for appetite change.  HENT: Negative for nosebleeds, congestion, sore throat, sneezing, trouble swallowing and neck pain.   Eyes: Negative for itching and visual disturbance.  Respiratory: Negative for cough.   Cardiovascular: Negative for chest pain, palpitations and leg swelling.  Gastrointestinal: Negative for nausea, diarrhea, blood in stool and abdominal distention.  Genitourinary: Negative for frequency and hematuria.  Musculoskeletal: Negative for back pain, joint swelling and gait problem.  Skin: Negative for rash.  Neurological: Negative for dizziness, tremors, speech difficulty and weakness.  Psychiatric/Behavioral: Negative for sleep disturbance, dysphoric mood and agitation. The patient is not nervous/anxious.    Wt Readings from Last 3 Encounters:  05/28/11 351 lb (159.213 kg)  05/12/11 357 lb (161.934 kg)  03/27/11 348 lb (157.852 kg)       Objective:   Physical Exam  Constitutional: He is oriented to person, place, and time. He appears well-developed.       Obese  HENT:  Mouth/Throat: Oropharynx is clear and moist.  Eyes: Conjunctivae are normal. Pupils are equal, round, and reactive to light.  Neck: Normal range of motion. No JVD present. No thyromegaly present.  Cardiovascular: Normal rate, regular rhythm, normal heart sounds and intact distal pulses.  Exam reveals no gallop and no friction rub.   No murmur heard. Pulmonary/Chest: Effort normal and breath sounds normal. No respiratory distress. He has no wheezes. He has no rales. He exhibits no tenderness.  Abdominal: Soft. Bowel sounds are normal. He exhibits no distension and no mass. There is no tenderness. There is no rebound and no guarding.    Musculoskeletal: Normal range of motion. He exhibits no edema and no tenderness.  Lymphadenopathy:    He has no cervical adenopathy.  Neurological: He is alert and oriented to person, place, and time. He has normal reflexes. No cranial nerve deficit. He exhibits normal muscle tone. Coordination normal.  Skin: Skin is warm and dry. No rash noted.  Psychiatric: He has a normal mood and affect. His behavior is normal. Judgment and thought content normal.     05/19/11: Testost 843 FSH 4.2  Prolactin 12.1     Assessment & Plan:

## 2011-06-01 NOTE — Assessment & Plan Note (Signed)
Glu 84 - better

## 2011-06-01 NOTE — Assessment & Plan Note (Signed)
Preparing for surgery

## 2011-06-01 NOTE — Assessment & Plan Note (Signed)
Doing well on Rx.

## 2011-06-01 NOTE — Assessment & Plan Note (Signed)
Better on testosterone and CPAP

## 2011-06-01 NOTE — Assessment & Plan Note (Signed)
On Rx 

## 2011-06-04 ENCOUNTER — Encounter: Payer: Self-pay | Admitting: Internal Medicine

## 2011-07-23 ENCOUNTER — Ambulatory Visit: Payer: Self-pay | Admitting: Bariatrics

## 2011-09-01 ENCOUNTER — Ambulatory Visit: Payer: Self-pay | Admitting: Bariatrics

## 2011-09-01 DIAGNOSIS — I4891 Unspecified atrial fibrillation: Secondary | ICD-10-CM

## 2011-10-01 ENCOUNTER — Ambulatory Visit (INDEPENDENT_AMBULATORY_CARE_PROVIDER_SITE_OTHER): Payer: 59 | Admitting: Internal Medicine

## 2011-10-01 ENCOUNTER — Encounter: Payer: Self-pay | Admitting: Internal Medicine

## 2011-10-01 DIAGNOSIS — I4891 Unspecified atrial fibrillation: Secondary | ICD-10-CM

## 2011-10-01 DIAGNOSIS — E291 Testicular hypofunction: Secondary | ICD-10-CM

## 2011-10-01 DIAGNOSIS — I10 Essential (primary) hypertension: Secondary | ICD-10-CM

## 2011-10-01 NOTE — Assessment & Plan Note (Signed)
Continue with current prescription therapy as reflected on the Med list.  

## 2011-10-01 NOTE — Assessment & Plan Note (Signed)
12/12 Dr Lady Gary Continue with current prescription therapy as reflected on the Med list.

## 2011-10-01 NOTE — Assessment & Plan Note (Signed)
Not using Rx now

## 2011-10-01 NOTE — Assessment & Plan Note (Signed)
Wt loss surgery was delayed

## 2011-10-01 NOTE — Progress Notes (Signed)
  Subjective:    Patient ID: Dakota Hernandez, male    DOB: Apr 12, 1966, 45 y.o.   MRN: [id number]  HPI  The patient presents for a follow-up of  chronic hypertension, chronic dyslipidemia, type 2 diabetes controlled with medicines  He developed A fib on the day of bypass surgery (09/11/11) - it was cancelled. He is on Coum and Diltiazem now...   Review of Systems  Constitutional: Negative for appetite change, fatigue and unexpected weight change.  HENT: Negative for nosebleeds, congestion, sore throat, sneezing, trouble swallowing and neck pain.   Eyes: Negative for itching and visual disturbance.  Respiratory: Negative for cough.   Cardiovascular: Negative for chest pain, palpitations and leg swelling.  Gastrointestinal: Negative for nausea, diarrhea, blood in stool and abdominal distention.  Genitourinary: Negative for frequency and hematuria.  Musculoskeletal: Negative for back pain, joint swelling and gait problem.  Skin: Negative for rash.  Neurological: Negative for dizziness, tremors, speech difficulty and weakness.  Psychiatric/Behavioral: Negative for sleep disturbance, dysphoric mood and agitation. The patient is not nervous/anxious.    Wt Readings from Last 3 Encounters:  10/01/11 329 lb 12 oz (149.574 kg)  05/28/11 351 lb (159.213 kg)  05/12/11 357 lb (161.934 kg)   BP Readings from Last 3 Encounters:  10/01/11 110/84  05/28/11 138/100  05/12/11 132/84        Objective:   Physical Exam  Constitutional: He is oriented to person, place, and time. He appears well-developed.       Obese   HENT:  Mouth/Throat: Oropharynx is clear and moist.  Eyes: Conjunctivae are normal. Pupils are equal, round, and reactive to light.  Neck: Normal range of motion. No JVD present. No thyromegaly present.  Cardiovascular: Normal rate, regular rhythm, normal heart sounds and intact distal pulses.  Exam reveals no gallop and no friction rub.   No murmur heard. Pulmonary/Chest:  Effort normal and breath sounds normal. No respiratory distress. He has no wheezes. He has no rales. He exhibits no tenderness.  Abdominal: Soft. Bowel sounds are normal. He exhibits no distension and no mass. There is no tenderness. There is no rebound and no guarding.  Musculoskeletal: Normal range of motion. He exhibits no edema and no tenderness.  Lymphadenopathy:    He has no cervical adenopathy.  Neurological: He is alert and oriented to person, place, and time. He has normal reflexes. No cranial nerve deficit. He exhibits normal muscle tone. Coordination normal.  Skin: Skin is warm and dry. No rash noted.  Psychiatric: He has a normal mood and affect. His behavior is normal. Judgment and thought content normal.    Lab Results  Component Value Date   WBC 4.5 06/14/2008   HGB 15.4 06/14/2008   HCT 44.1 06/14/2008   PLT 212 06/14/2008   GLUCOSE 94 06/14/2008   CHOL 137 06/14/2008   TRIG 69 06/14/2008   HDL 34.5* 06/14/2008   LDLCALC 89 06/14/2008   ALT 35 06/14/2008   AST 25 06/14/2008   NA 141 06/14/2008   K 3.8 06/14/2008   CL 105 06/14/2008   CREATININE 1.1 06/14/2008   BUN 10 06/14/2008   CO2 30 06/14/2008   TSH 2.28 06/14/2008         Assessment & Plan:

## 2011-10-26 ENCOUNTER — Other Ambulatory Visit: Payer: Self-pay | Admitting: *Deleted

## 2011-10-26 MED ORDER — LOSARTAN POTASSIUM-HCTZ 100-12.5 MG PO TABS: 1.0000 | ORAL_TABLET | Freq: Every day | ORAL | Status: DC

## 2011-10-26 MED ORDER — METFORMIN HCL 500 MG PO TABS: 500.0000 mg | ORAL_TABLET | Freq: Every day | ORAL | Status: DC

## 2011-11-12 ENCOUNTER — Ambulatory Visit: Payer: 59 | Admitting: Pulmonary Disease

## 2012-01-07 ENCOUNTER — Ambulatory Visit: Payer: Self-pay | Admitting: Cardiology

## 2012-01-07 LAB — PROTIME-INR: INR: 1.9

## 2012-01-08 ENCOUNTER — Other Ambulatory Visit: Payer: Self-pay | Admitting: *Deleted

## 2012-01-08 MED ORDER — METFORMIN HCL 500 MG PO TABS: 500.0000 mg | ORAL_TABLET | Freq: Every day | ORAL | Status: DC

## 2012-01-08 NOTE — Telephone Encounter (Signed)
R'cd fax from [facility] for refill of Metformin.

## 2012-01-14 ENCOUNTER — Other Ambulatory Visit: Payer: Self-pay | Admitting: *Deleted

## 2012-01-14 MED ORDER — LOSARTAN POTASSIUM-HCTZ 100-12.5 MG PO TABS: 1.0000 | ORAL_TABLET | Freq: Every day | ORAL | Status: DC

## 2012-01-14 MED ORDER — METFORMIN HCL 500 MG PO TABS: 500.0000 mg | ORAL_TABLET | Freq: Every day | ORAL | Status: DC

## 2012-01-28 ENCOUNTER — Ambulatory Visit (INDEPENDENT_AMBULATORY_CARE_PROVIDER_SITE_OTHER): Payer: 59 | Admitting: Internal Medicine

## 2012-01-28 ENCOUNTER — Encounter: Payer: Self-pay | Admitting: Internal Medicine

## 2012-01-28 VITALS — BP 128/88 | HR 80 | Temp 97.5°F | Resp 16 | Wt 349.0 lb

## 2012-01-28 DIAGNOSIS — I10 Essential (primary) hypertension: Secondary | ICD-10-CM

## 2012-01-28 DIAGNOSIS — N320 Bladder-neck obstruction: Secondary | ICD-10-CM | POA: Insufficient documentation

## 2012-01-28 DIAGNOSIS — B351 Tinea unguium: Secondary | ICD-10-CM | POA: Insufficient documentation

## 2012-01-28 DIAGNOSIS — G4733 Obstructive sleep apnea (adult) (pediatric): Secondary | ICD-10-CM

## 2012-01-28 DIAGNOSIS — I4891 Unspecified atrial fibrillation: Secondary | ICD-10-CM

## 2012-01-28 DIAGNOSIS — E291 Testicular hypofunction: Secondary | ICD-10-CM

## 2012-01-28 MED ORDER — CICLOPIROX 8 % EX SOLN: Freq: Every day | CUTANEOUS | Status: AC

## 2012-01-28 MED ORDER — LOSARTAN POTASSIUM-HCTZ 100-12.5 MG PO TABS: 1.0000 | ORAL_TABLET | Freq: Every day | ORAL | Status: DC

## 2012-01-28 NOTE — Progress Notes (Signed)
Patient ID: Dakota Hernandez, male   DOB: May 09, 1966, 46 y.o.   MRN: [id number]  Subjective:    Patient ID: Dakota Hernandez, male    DOB: 1966/07/03, 46 y.o.   MRN: [id number]  HPI  The patient presents for a follow-up of  chronic hypertension, chronic dyslipidemia, type 2 diabetes controlled with medicines  He developed A fib on the day of his gastric bypass surgery (09/11/11) - it was cancelled. He is on Coum and Diltiazem now... He ad cardioversion 2 wks ago. C/o toenail fungus  C/o peeing difficulty x 1 wk   Review of Systems  Constitutional: Negative for appetite change, fatigue and unexpected weight change.  HENT: Negative for nosebleeds, congestion, sore throat, sneezing, trouble swallowing and neck pain.   Eyes: Negative for itching and visual disturbance.  Respiratory: Negative for cough.   Cardiovascular: Negative for chest pain, palpitations and leg swelling.  Gastrointestinal: Negative for nausea, diarrhea, blood in stool and abdominal distention.  Genitourinary: Negative for frequency and hematuria.  Musculoskeletal: Negative for back pain, joint swelling and gait problem.  Skin: Negative for rash.  Neurological: Negative for dizziness, tremors, speech difficulty and weakness.  Psychiatric/Behavioral: Negative for sleep disturbance, dysphoric mood and agitation. The patient is not nervous/anxious.    Wt Readings from Last 3 Encounters:  01/28/12 349 lb (158.305 kg)  10/01/11 329 lb 12 oz (149.574 kg)  05/28/11 351 lb (159.213 kg)   BP Readings from Last 3 Encounters:  01/28/12 128/88  10/01/11 110/84  05/28/11 138/100        Objective:   Physical Exam  Constitutional: He is oriented to person, place, and time. He appears well-developed.       Obese   HENT:  Mouth/Throat: Oropharynx is clear and moist.  Eyes: Conjunctivae are normal. Pupils are equal, round, and reactive to light.  Neck: Normal range of motion. No JVD present. No thyromegaly present.    Cardiovascular: Normal rate, regular rhythm, normal heart sounds and intact distal pulses.  Exam reveals no gallop and no friction rub.   No murmur heard. Pulmonary/Chest: Effort normal and breath sounds normal. No respiratory distress. He has no wheezes. He has no rales. He exhibits no tenderness.  Abdominal: Soft. Bowel sounds are normal. He exhibits no distension and no mass. There is no tenderness. There is no rebound and no guarding.  Musculoskeletal: Normal range of motion. He exhibits no edema and no tenderness.  Lymphadenopathy:    He has no cervical adenopathy.  Neurological: He is alert and oriented to person, place, and time. He has normal reflexes. No cranial nerve deficit. He exhibits normal muscle tone. Coordination normal.  Skin: Skin is warm and dry. No rash noted.  Psychiatric: He has a normal mood and affect. His behavior is normal. Judgment and thought content normal.  Onycho B  Lab Results  Component Value Date   WBC 4.5 06/14/2008   HGB 15.4 06/14/2008   HCT 44.1 06/14/2008   PLT 212 06/14/2008   GLUCOSE 94 06/14/2008   CHOL 137 06/14/2008   TRIG 69 06/14/2008   HDL 34.5* 06/14/2008   LDLCALC 89 06/14/2008   ALT 35 06/14/2008   AST 25 06/14/2008   NA 141 06/14/2008   K 3.8 06/14/2008   CL 105 06/14/2008   CREATININE 1.1 06/14/2008   BUN 10 06/14/2008   CO2 30 06/14/2008   TSH 2.28 06/14/2008         Assessment & Plan:

## 2012-01-28 NOTE — Assessment & Plan Note (Signed)
Cont w/CPAP 

## 2012-01-28 NOTE — Assessment & Plan Note (Signed)
12/12 Dr Lady Gary 4/13 - cardioversion - in NSR now Continue with current prescription therapy as reflected on the Med list.

## 2012-01-28 NOTE — Assessment & Plan Note (Signed)
Continue with current prescription therapy as reflected on the Med list.  

## 2012-01-28 NOTE — Assessment & Plan Note (Signed)
[place]

## 2012-01-28 NOTE — Assessment & Plan Note (Signed)
UA and PSA

## 2012-01-28 NOTE — Assessment & Plan Note (Signed)
Surgery is pending 

## 2012-01-28 NOTE — Assessment & Plan Note (Signed)
Off rx ?reason

## 2012-02-22 ENCOUNTER — Encounter: Payer: Self-pay | Admitting: Internal Medicine

## 2012-05-05 HISTORY — PX: LAPAROSCOPIC GASTRIC SLEEVE RESECTION: SHX5895

## 2012-06-02 ENCOUNTER — Ambulatory Visit: Payer: 59 | Admitting: Internal Medicine

## 2012-07-15 ENCOUNTER — Encounter: Payer: Self-pay | Admitting: Internal Medicine

## 2012-07-15 ENCOUNTER — Ambulatory Visit (INDEPENDENT_AMBULATORY_CARE_PROVIDER_SITE_OTHER): Payer: 59 | Admitting: Internal Medicine

## 2012-07-15 VITALS — BP 118/76 | HR 68 | Temp 98.0°F | Resp 16 | Wt 294.0 lb

## 2012-07-15 DIAGNOSIS — G4733 Obstructive sleep apnea (adult) (pediatric): Secondary | ICD-10-CM

## 2012-07-15 DIAGNOSIS — I4821 Permanent atrial fibrillation: Secondary | ICD-10-CM | POA: Insufficient documentation

## 2012-07-15 DIAGNOSIS — E291 Testicular hypofunction: Secondary | ICD-10-CM

## 2012-07-15 DIAGNOSIS — E119 Type 2 diabetes mellitus without complications: Secondary | ICD-10-CM

## 2012-07-15 DIAGNOSIS — E559 Vitamin D deficiency, unspecified: Secondary | ICD-10-CM

## 2012-07-15 DIAGNOSIS — Z23 Encounter for immunization: Secondary | ICD-10-CM

## 2012-07-15 DIAGNOSIS — I4891 Unspecified atrial fibrillation: Secondary | ICD-10-CM

## 2012-07-15 DIAGNOSIS — I10 Essential (primary) hypertension: Secondary | ICD-10-CM

## 2012-07-15 NOTE — Assessment & Plan Note (Signed)
Using med rarely Labs

## 2012-07-15 NOTE — Assessment & Plan Note (Signed)
12/12 Dr Lady Gary 4/13 - cardioversion - in NSR now On Rx

## 2012-07-15 NOTE — Assessment & Plan Note (Signed)
Off rx 

## 2012-07-15 NOTE — Progress Notes (Signed)
Patient ID: Dakota Hernandez, male   DOB: 02-05-1966, 46 y.o.   MRN: [id number] Patient ID: Dakota Hernandez, male   DOB: 07/28/1966, 46 y.o.   MRN: [id number]  Subjective:    Patient ID: Dakota Hernandez, male    DOB: [birth year], 46 y.o.   MRN: [id number]  HPI  He had a sleeve procedure on 05/19/12 The patient presents for a follow-up of  chronic hypertension, chronic dyslipidemia, type 2 diabetes controlled with medicines  He developed A fib on the day of his gastric bypass surgery (09/11/11) - it was cancelled. He is on Coum and Diltiazem now... He had cardioversion 2 wks ago. C/o toenail fungus  C/o peeing difficulty x 1 wk   Review of Systems  Constitutional: Negative for appetite change, fatigue and unexpected weight change.  HENT: Negative for nosebleeds, congestion, sore throat, sneezing, trouble swallowing and neck pain.   Eyes: Negative for itching and visual disturbance.  Respiratory: Negative for cough.   Cardiovascular: Negative for chest pain, palpitations and leg swelling.  Gastrointestinal: Negative for nausea, diarrhea, blood in stool and abdominal distention.  Genitourinary: Negative for frequency and hematuria.  Musculoskeletal: Negative for back pain, joint swelling and gait problem.  Skin: Negative for rash.  Neurological: Negative for dizziness, tremors, speech difficulty and weakness.  Psychiatric/Behavioral: Negative for disturbed wake/sleep cycle, dysphoric mood and agitation. The patient is not nervous/anxious.    Wt Readings from Last 3 Encounters:  07/15/12 294 lb (133.358 kg)  01/28/12 349 lb (158.305 kg)  10/01/11 329 lb 12 oz (149.574 kg)   BP Readings from Last 3 Encounters:  07/15/12 118/76  01/28/12 128/88  10/01/11 110/84        Objective:   Physical Exam  Constitutional: He is oriented to person, place, and time. He appears well-developed.       Obese   HENT:  Mouth/Throat: Oropharynx is clear and moist.  Eyes: Conjunctivae normal  are normal. Pupils are equal, round, and reactive to light.  Neck: Normal range of motion. No JVD present. No thyromegaly present.  Cardiovascular: Normal rate, regular rhythm, normal heart sounds and intact distal pulses.  Exam reveals no gallop and no friction rub.   No murmur heard. Pulmonary/Chest: Effort normal and breath sounds normal. No respiratory distress. He has no wheezes. He has no rales. He exhibits no tenderness.  Abdominal: Soft. Bowel sounds are normal. He exhibits no distension and no mass. There is no tenderness. There is no rebound and no guarding.  Musculoskeletal: Normal range of motion. He exhibits no edema and no tenderness.  Lymphadenopathy:    He has no cervical adenopathy.  Neurological: He is alert and oriented to person, place, and time. He has normal reflexes. No cranial nerve deficit. He exhibits normal muscle tone. Coordination normal.  Skin: Skin is warm and dry. No rash noted.  Psychiatric: He has a normal mood and affect. His behavior is normal. Judgment and thought content normal.  Onycho B  Lab Results  Component Value Date   WBC 4.5 06/14/2008   HGB 15.4 06/14/2008   HCT 44.1 06/14/2008   PLT 212 06/14/2008   GLUCOSE 94 06/14/2008   CHOL 137 06/14/2008   TRIG 69 06/14/2008   HDL 34.5* 06/14/2008   LDLCALC 89 06/14/2008   ALT 35 06/14/2008   AST 25 06/14/2008   NA 141 06/14/2008   K 3.8 06/14/2008   CL 105 06/14/2008   CREATININE 1.1 06/14/2008   BUN 10 06/14/2008   CO2 30  06/14/2008   TSH 2.28 06/14/2008         Assessment & Plan:

## 2012-07-15 NOTE — Assessment & Plan Note (Signed)
S/p gastric sleeve procedure - 8/13 Lost 60 lbs

## 2012-07-15 NOTE — Assessment & Plan Note (Signed)
Not using a CPAP 

## 2012-07-15 NOTE — Assessment & Plan Note (Signed)
Continue with current prescription therapy as reflected on the Med list.  

## 2012-07-15 NOTE — Assessment & Plan Note (Signed)
Likely resolved post wt loss Labs

## 2012-07-17 ENCOUNTER — Encounter: Payer: Self-pay | Admitting: Internal Medicine

## 2012-07-25 ENCOUNTER — Telehealth: Payer: Self-pay | Admitting: *Deleted

## 2012-07-25 NOTE — Telephone Encounter (Signed)
Pt's [facility] labs reviewed by Dr. Posey Rea- His labs are normal except testosterone is low and b 12 is low normal. He want him to start vit b12 1000 SL qd and restart testosterone therapy.  Left mess for patient to call back to inform pt of this. Sent labs to be scanned.

## 2012-07-27 ENCOUNTER — Encounter: Payer: Self-pay | Admitting: Internal Medicine

## 2012-08-04 NOTE — Telephone Encounter (Signed)
Pt informed

## 2013-01-12 ENCOUNTER — Encounter: Payer: Self-pay | Admitting: Internal Medicine

## 2013-01-12 ENCOUNTER — Ambulatory Visit (INDEPENDENT_AMBULATORY_CARE_PROVIDER_SITE_OTHER): Payer: 59 | Admitting: Internal Medicine

## 2013-01-12 ENCOUNTER — Telehealth: Payer: Self-pay | Admitting: *Deleted

## 2013-01-12 VITALS — BP 128/70 | HR 80 | Temp 97.0°F | Resp 16 | Wt 265.0 lb

## 2013-01-12 DIAGNOSIS — I10 Essential (primary) hypertension: Secondary | ICD-10-CM

## 2013-01-12 DIAGNOSIS — G4733 Obstructive sleep apnea (adult) (pediatric): Secondary | ICD-10-CM

## 2013-01-12 DIAGNOSIS — Z0000 Encounter for general adult medical examination without abnormal findings: Secondary | ICD-10-CM

## 2013-01-12 DIAGNOSIS — E291 Testicular hypofunction: Secondary | ICD-10-CM

## 2013-01-12 DIAGNOSIS — N320 Bladder-neck obstruction: Secondary | ICD-10-CM

## 2013-01-12 DIAGNOSIS — R609 Edema, unspecified: Secondary | ICD-10-CM

## 2013-01-12 DIAGNOSIS — R739 Hyperglycemia, unspecified: Secondary | ICD-10-CM

## 2013-01-12 DIAGNOSIS — I872 Venous insufficiency (chronic) (peripheral): Secondary | ICD-10-CM

## 2013-01-12 DIAGNOSIS — E119 Type 2 diabetes mellitus without complications: Secondary | ICD-10-CM

## 2013-01-12 DIAGNOSIS — I4891 Unspecified atrial fibrillation: Secondary | ICD-10-CM

## 2013-01-12 MED ORDER — APIXABAN 5 MG PO TABS: 5.0000 mg | ORAL_TABLET | Freq: Two times a day (BID) | ORAL | Status: DC

## 2013-01-12 NOTE — Progress Notes (Signed)
   Subjective:    HPI  He had a sleeve procedure on 05/19/12  The patient presents for a follow-up of  chronic hypertension, chronic dyslipidemia, type 2 diabetes controlled with medicines  He developed A fib on the day of his gastric bypass surgery (09/11/11) - it was cancelled. He is on Coum and Diltiazem now... He had cardioversion 2 wks ago. C/o toenail fungus     Review of Systems  Constitutional: Negative for appetite change, fatigue and unexpected weight change.  HENT: Negative for nosebleeds, congestion, sore throat, sneezing, trouble swallowing and neck pain.   Eyes: Negative for itching and visual disturbance.  Respiratory: Negative for cough.   Cardiovascular: Negative for chest pain, palpitations and leg swelling.  Gastrointestinal: Negative for nausea, diarrhea, blood in stool and abdominal distention.  Genitourinary: Negative for frequency and hematuria.  Musculoskeletal: Negative for back pain, joint swelling and gait problem.  Skin: Negative for rash.  Neurological: Negative for dizziness, tremors, speech difficulty and weakness.  Psychiatric/Behavioral: Negative for sleep disturbance, dysphoric mood and agitation. The patient is not nervous/anxious.    Wt Readings from Last 3 Encounters:  01/12/13 265 lb (120.203 kg)  07/15/12 294 lb (133.358 kg)  01/28/12 349 lb (158.305 kg)   BP Readings from Last 3 Encounters:  01/12/13 128/70  07/15/12 118/76  01/28/12 128/88        Objective:   Physical Exam  Constitutional: He is oriented to person, place, and time. He appears well-developed.  Obese   HENT:  Mouth/Throat: Oropharynx is clear and moist.  Eyes: Conjunctivae are normal. Pupils are equal, round, and reactive to light.  Neck: Normal range of motion. No JVD present. No thyromegaly present.  Cardiovascular: Normal rate, normal heart sounds and intact distal pulses.  Exam reveals no gallop and no friction rub.   No murmur heard. irreg irreg   Pulmonary/Chest: Effort normal and breath sounds normal. No respiratory distress. He has no wheezes. He has no rales. He exhibits no tenderness.  Abdominal: Soft. Bowel sounds are normal. He exhibits no distension and no mass. There is no tenderness. There is no rebound and no guarding.  Musculoskeletal: Normal range of motion. He exhibits no edema and no tenderness.  Lymphadenopathy:    He has no cervical adenopathy.  Neurological: He is alert and oriented to person, place, and time. He has normal reflexes. No cranial nerve deficit. He exhibits normal muscle tone. Coordination normal.  Skin: Skin is warm and dry. No rash noted.  Psychiatric: He has a normal mood and affect. His behavior is normal. Judgment and thought content normal.  Onycho B  Lab Results  Component Value Date   WBC 4.5 06/14/2008   HGB 15.4 06/14/2008   HCT 44.1 06/14/2008   PLT 212 06/14/2008   GLUCOSE 94 06/14/2008   CHOL 137 06/14/2008   TRIG 69 06/14/2008   HDL 34.5* 06/14/2008   LDLCALC 89 06/14/2008   ALT 35 06/14/2008   AST 25 06/14/2008   NA 141 06/14/2008   K 3.8 06/14/2008   CL 105 06/14/2008   CREATININE 1.1 06/14/2008   BUN 10 06/14/2008   CO2 30 06/14/2008   TSH 2.28 06/14/2008         Assessment & Plan:

## 2013-01-12 NOTE — Telephone Encounter (Signed)
Message copied by Carin Primrose on Thu Jan 12, 2013  3:27 PM ------      Message from: Etheleen Sia      Created: Thu Jan 12, 2013 10:07 AM      Regarding: LAB       PHYSICAL LABS IN OCT ------

## 2013-01-12 NOTE — Assessment & Plan Note (Signed)
Wt Readings from Last 3 Encounters:  01/12/13 265 lb (120.203 kg)  07/15/12 294 lb (133.358 kg)  01/28/12 349 lb (158.305 kg)

## 2013-01-12 NOTE — Assessment & Plan Note (Signed)
Better BP Readings from Last 3 Encounters:  01/12/13 128/70  07/15/12 118/76  01/28/12 128/88

## 2013-01-12 NOTE — Assessment & Plan Note (Signed)
12/12 Dr Lady Gary 4/13 - cardioversion - in NSR now 4/14 relapsed: EKG, Card f/u. Rate controlled Will start Eliquis 5 mg bid - samples and Rx given  Potential benefits of a long term Eliquis  use as well as potential risks  and complications were explained to the patient and were aknowledged.

## 2013-01-12 NOTE — Assessment & Plan Note (Signed)
Not using CPAP.  

## 2013-01-12 NOTE — Assessment & Plan Note (Signed)
Labs

## 2013-01-12 NOTE — Assessment & Plan Note (Signed)
Better post wt loss

## 2013-01-12 NOTE — Telephone Encounter (Signed)
Labs ordered.

## 2013-01-12 NOTE — Assessment & Plan Note (Signed)
Resolved

## 2013-01-17 ENCOUNTER — Telehealth: Payer: Self-pay | Admitting: Internal Medicine

## 2013-01-17 DIAGNOSIS — E538 Deficiency of other specified B group vitamins: Secondary | ICD-10-CM | POA: Insufficient documentation

## 2013-01-17 DIAGNOSIS — E559 Vitamin D deficiency, unspecified: Secondary | ICD-10-CM

## 2013-01-17 MED ORDER — ERGOCALCIFEROL 1.25 MG (50000 UT) PO CAPS: 50000.0000 [IU] | ORAL_CAPSULE | ORAL | Status: DC

## 2013-01-17 MED ORDER — CYANOCOBALAMIN 1000 MCG/ML IJ SOLN: 1000.0000 ug | INTRAMUSCULAR | Status: DC

## 2013-01-17 NOTE — Telephone Encounter (Signed)
Dakota Hernandez, please, inform patient that all labs from 01/12/13 are normal except for low vit B12 and vit D Start vit d Rx, RTC for a B12 shot (OV). Testost was nl. Thx

## 2013-01-18 NOTE — Telephone Encounter (Signed)
Left mess for patient to call back.  

## 2013-01-26 NOTE — Telephone Encounter (Signed)
Pt informed

## 2013-02-09 ENCOUNTER — Encounter: Payer: Self-pay | Admitting: Internal Medicine

## 2013-02-14 ENCOUNTER — Encounter: Payer: Self-pay | Admitting: Cardiology

## 2013-02-14 ENCOUNTER — Encounter: Payer: Self-pay | Admitting: *Deleted

## 2013-02-14 ENCOUNTER — Telehealth: Payer: Self-pay | Admitting: Cardiology

## 2013-02-14 ENCOUNTER — Ambulatory Visit (INDEPENDENT_AMBULATORY_CARE_PROVIDER_SITE_OTHER): Payer: 59 | Admitting: Cardiology

## 2013-02-14 VITALS — BP 120/90 | HR 116 | Ht 70.0 in | Wt 263.8 lb

## 2013-02-14 DIAGNOSIS — I4891 Unspecified atrial fibrillation: Secondary | ICD-10-CM

## 2013-02-14 DIAGNOSIS — I420 Dilated cardiomyopathy: Secondary | ICD-10-CM

## 2013-02-14 DIAGNOSIS — I428 Other cardiomyopathies: Secondary | ICD-10-CM

## 2013-02-14 MED ORDER — METOPROLOL SUCCINATE ER 50 MG PO TB24: 50.0000 mg | ORAL_TABLET | Freq: Every day | ORAL | Status: DC

## 2013-02-14 NOTE — Patient Instructions (Addendum)
Your physician recommends that you schedule a follow-up appointment in: 8 WEEKS WITH DR Jens Som  Your physician has requested that you have an echocardiogram. Echocardiography is a painless test that uses sound waves to create images of your heart. It provides your doctor with information about the size and shape of your heart and how well your heart's chambers and valves are working. This procedure takes approximately one hour. There are no restrictions for this procedure.   START METOPROLOL 50 MG ONCE DAILY  Your physician has recommended that you have a Cardioversion (DCCV). Electrical Cardioversion uses a jolt of electricity to your heart either through paddles or wired patches attached to your chest. This is a controlled, usually prescheduled, procedure. Defibrillation is done under light anesthesia in the hospital, and you usually go home the day of the procedure. This is done to get your heart back into a normal rhythm. You are not awake for the procedure. Please see the instruction sheet given to you today.

## 2013-02-14 NOTE — Assessment & Plan Note (Addendum)
Patient was previously noted to have a cardiomyopathy. Etiology was unclear. Question tachycardia mediated related to atrial fibrillation. Repeat echocardiogram. If LV function reduced we'll need further ischemia evaluation. Will also add ACE inhibitor if reduced. Obtain records from Dr America Brown office.

## 2013-02-14 NOTE — Assessment & Plan Note (Signed)
Patient has developed recurrent atrial fibrillation. His rate is elevated. Plan add Toprol 50 mg by mouth daily. Check TSH. Repeat echocardiogram. He has embolic risk factors of previous cardiomyopathy, previous diabetes and previous hypertension. Continue apixaban. Check hemoglobin and renal function. I will plan to repeat cardioversion 4 weeks from now. If he does not hold sinus rhythm we will need to decide between rate control/anticoagulation versus rhythm control. He is asymptomatic.

## 2013-02-14 NOTE — Progress Notes (Signed)
HPI: 47 year old male for evaluation of atrial fibrillation. Patient has previously been followed by Dr. Lady Gary in [place]. The patient was noted to be in atrial fibrillation in December of 2012 prior to gastric bypass surgery. He was asymptomatic. Apparently was also noted previously to have a cardiomyopathy with an ejection fraction of 25-30%; also with moderate pulmonary hypertension. He had previous cardioversion in April of 2013. I do not have all of his records available. He apparently maintained sinus rhythm and there is a note from December of 2013 that states he was in sinus.  He was recently seen by his primary care physician and noted to have recurrent atrial fibrillation. Cardiology is asked to evaluate. He denies dyspnea, chest pain, palpitations or syncope.  Current Outpatient Prescriptions  Medication Sig Dispense Refill  . apixaban (ELIQUIS) 5 MG TABS tablet Take 1 tablet (5 mg total) by mouth 2 (two) times daily.  60 tablet  11  . Cholecalciferol (VITAMIN D3) 1000 UNITS CAPS Take 1 capsule by mouth daily.        . cyanocobalamin (COBAL-1000) 1000 MCG/ML injection Inject 1 mL (1,000 mcg total) into the muscle every 14 (fourteen) days.  10 mL  6  . ergocalciferol (VITAMIN D2) 50000 UNITS capsule Take 1 capsule (50,000 Units total) by mouth once a week.  6 capsule  0   No current facility-administered medications for this visit.    Allergies  Allergen Reactions  . Hydrochlorothiazide     REACTION: ED    Past Medical History  Diagnosis Date  . Hypertension   . Obesity   . Hypogonadism male   . ED (erectile dysfunction)   . Atrial fibrillation   . Diabetes     Previously on glucophage; now on no meds    Past Surgical History  Procedure Laterality Date  . Appendectomy  1981  . Eye surgery  in 1st grade    Left eye  . Laparoscopic gastric sleeve resection  8/13    History   Social History  . Marital Status: Married    Spouse Name: [name]    Number of Children:  N  . Years of Education: N/A   Occupational History  . 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  [employer]   Social History Main Topics  . Smoking status: Never Smoker   . Smokeless tobacco: Not on file  . Alcohol Use: No  . Drug Use: Not on file  . Sexually Active: Yes   Other Topics Concern  . Not on file   Social History Narrative   Got married 08/2010      Regular Exercise- yes          Family History  Problem Relation Age of Onset  . Heart attack Father 12  . Heart disease Father 60  . Heart attack Other     cousin  . Heart disease Other     ROS: no fevers or chills, productive cough, hemoptysis, dysphasia, odynophagia, melena, hematochezia, dysuria, hematuria, rash, seizure activity, orthopnea, PND, pedal edema, claudication. Remaining systems are negative.  Physical Exam:   Blood pressure 120/90, pulse 116, height 5\' 10"  (1.778 m), weight 263 lb 12.8 oz (119.659 kg).  General:  Well developed/well nourished in NAD Skin warm/dry Patient not depressed No peripheral clubbing Back-normal HEENT-normal/normal eyelids Neck supple/normal carotid upstroke bilaterally; no bruits; no JVD; no thyromegaly chest - CTA/ normal expansion CV - tachycardic and irregular/normal S1 and S2; no murmurs, rubs or gallops;  PMI nondisplaced Abdomen -NT/ND, no HSM, no  mass, + bowel sounds, no bruit 2+ femoral pulses, no bruits Ext-no edema, chords, 2+ DP Neuro-grossly nonfocal  ECG 01/12/2013-atrial fibrillation, nonspecific ST changes.  Electrocardiogram today shows atrial fibrillation with a back ventricular response. Heart rate is 116. Nonspecific ST changes.

## 2013-02-14 NOTE — Telephone Encounter (Signed)
ROI faxed to Dr.Fath/[phone] 02/14/13/KM

## 2013-02-15 ENCOUNTER — Other Ambulatory Visit: Payer: Self-pay | Admitting: Cardiology

## 2013-02-21 ENCOUNTER — Other Ambulatory Visit: Payer: Self-pay | Admitting: Cardiology

## 2013-02-23 ENCOUNTER — Ambulatory Visit (HOSPITAL_COMMUNITY): Payer: 59 | Attending: Cardiology | Admitting: Radiology

## 2013-02-23 DIAGNOSIS — I079 Rheumatic tricuspid valve disease, unspecified: Secondary | ICD-10-CM | POA: Insufficient documentation

## 2013-02-23 DIAGNOSIS — E669 Obesity, unspecified: Secondary | ICD-10-CM | POA: Insufficient documentation

## 2013-02-23 DIAGNOSIS — I509 Heart failure, unspecified: Secondary | ICD-10-CM | POA: Insufficient documentation

## 2013-02-23 DIAGNOSIS — E119 Type 2 diabetes mellitus without complications: Secondary | ICD-10-CM | POA: Insufficient documentation

## 2013-02-23 DIAGNOSIS — I4891 Unspecified atrial fibrillation: Secondary | ICD-10-CM

## 2013-02-23 DIAGNOSIS — I428 Other cardiomyopathies: Secondary | ICD-10-CM | POA: Insufficient documentation

## 2013-02-23 DIAGNOSIS — I059 Rheumatic mitral valve disease, unspecified: Secondary | ICD-10-CM | POA: Insufficient documentation

## 2013-02-23 DIAGNOSIS — I10 Essential (primary) hypertension: Secondary | ICD-10-CM | POA: Insufficient documentation

## 2013-02-23 NOTE — Progress Notes (Signed)
Echocardiogram performed.  

## 2013-02-28 ENCOUNTER — Telehealth: Payer: Self-pay

## 2013-02-28 ENCOUNTER — Telehealth: Payer: Self-pay | Admitting: Cardiology

## 2013-02-28 MED ORDER — APIXABAN 5 MG PO TABS: 5.0000 mg | ORAL_TABLET | Freq: Two times a day (BID) | ORAL | Status: DC

## 2013-02-28 NOTE — Telephone Encounter (Signed)
Are we doing this PA? Pls address He can pick up samples and voucher Thx

## 2013-02-28 NOTE — Telephone Encounter (Signed)
Pt called requesting status of PA for Eliquis, please advise

## 2013-02-28 NOTE — Telephone Encounter (Signed)
Left msg on triage stating this is his 4th time calling. Requesting to speak with Dr. Posey Rea or Misty Stanley ASAP. Needing md to get in touch with his insurance company concerning his eliquis. Pharmacy fax over information on Thursday. Was told by his cardiologist not to miss any of his dosages. Requesting call back today...Raechel Chute

## 2013-02-28 NOTE — Telephone Encounter (Signed)
PA form is partially completed in your red folder. He picked up 2 weeks worth of samples today.

## 2013-02-28 NOTE — Telephone Encounter (Deleted)
Error

## 2013-03-01 ENCOUNTER — Telehealth: Payer: Self-pay | Admitting: *Deleted

## 2013-03-01 NOTE — Telephone Encounter (Signed)
Eliquis PA form faxed and sent for scanning.

## 2013-03-01 NOTE — Telephone Encounter (Signed)
Thx

## 2013-03-01 NOTE — Telephone Encounter (Signed)
Eliquis PA is approved through 02/23/2014. Left detailed mess informing pt of below.

## 2013-03-07 ENCOUNTER — Telehealth: Payer: Self-pay | Admitting: Cardiology

## 2013-03-07 NOTE — Telephone Encounter (Signed)
Records rec From [facility]/Dr.Fath gave to Dakota Hernandez  03/07/13/KM

## 2013-03-24 ENCOUNTER — Encounter (HOSPITAL_COMMUNITY)
Admission: RE | Disposition: A | Discharge status: Home / Self Care | Payer: Self-pay | Source: Ambulatory Visit | Attending: Cardiology

## 2013-03-24 ENCOUNTER — Encounter (HOSPITAL_COMMUNITY): Payer: Self-pay | Admitting: Certified Registered Nurse Anesthetist

## 2013-03-24 ENCOUNTER — Encounter (HOSPITAL_COMMUNITY): Payer: Self-pay | Admitting: *Deleted

## 2013-03-24 ENCOUNTER — Ambulatory Visit (HOSPITAL_COMMUNITY)
Admission: RE | Admit: 2013-03-24 | Discharge: 2013-03-24 | Disposition: A | Discharge status: Home / Self Care | Payer: 59 | Source: Ambulatory Visit | Attending: Cardiology | Admitting: Cardiology

## 2013-03-24 ENCOUNTER — Ambulatory Visit (HOSPITAL_COMMUNITY): Payer: 59 | Admitting: Certified Registered Nurse Anesthetist

## 2013-03-24 DIAGNOSIS — E119 Type 2 diabetes mellitus without complications: Secondary | ICD-10-CM | POA: Insufficient documentation

## 2013-03-24 DIAGNOSIS — I10 Essential (primary) hypertension: Secondary | ICD-10-CM | POA: Insufficient documentation

## 2013-03-24 DIAGNOSIS — N529 Male erectile dysfunction, unspecified: Secondary | ICD-10-CM | POA: Insufficient documentation

## 2013-03-24 DIAGNOSIS — I4891 Unspecified atrial fibrillation: Secondary | ICD-10-CM

## 2013-03-24 DIAGNOSIS — Z7901 Long term (current) use of anticoagulants: Secondary | ICD-10-CM | POA: Insufficient documentation

## 2013-03-24 DIAGNOSIS — Z888 Allergy status to other drugs, medicaments and biological substances status: Secondary | ICD-10-CM | POA: Insufficient documentation

## 2013-03-24 DIAGNOSIS — E669 Obesity, unspecified: Secondary | ICD-10-CM | POA: Insufficient documentation

## 2013-03-24 DIAGNOSIS — I428 Other cardiomyopathies: Secondary | ICD-10-CM | POA: Insufficient documentation

## 2013-03-24 DIAGNOSIS — Z9884 Bariatric surgery status: Secondary | ICD-10-CM | POA: Insufficient documentation

## 2013-03-24 DIAGNOSIS — Z79899 Other long term (current) drug therapy: Secondary | ICD-10-CM | POA: Insufficient documentation

## 2013-03-24 DIAGNOSIS — I2789 Other specified pulmonary heart diseases: Secondary | ICD-10-CM | POA: Insufficient documentation

## 2013-03-24 HISTORY — PX: CARDIOVERSION: SHX1299

## 2013-03-24 LAB — GLUCOSE, CAPILLARY: Glucose-Capillary: 74 mg/dL (ref 70–99)

## 2013-03-24 SURGERY — CARDIOVERSION
Anesthesia: General

## 2013-03-24 MED ORDER — SODIUM CHLORIDE 0.9 % IV SOLN
INTRAVENOUS | Status: DC | PRN
  Administered 2013-03-24: 14:00:00 via INTRAVENOUS

## 2013-03-24 MED ORDER — LACTATED RINGERS IV SOLN: INTRAVENOUS | Status: DC | PRN

## 2013-03-24 MED ORDER — PROPOFOL 10 MG/ML IV BOLUS
INTRAVENOUS | Status: DC | PRN
  Administered 2013-03-24 (×2): 50 mg via INTRAVENOUS

## 2013-03-24 MED ORDER — SODIUM CHLORIDE 0.9 % IV SOLN
INTRAVENOUS | Status: DC
  Administered 2013-03-24: 13:00:00 via INTRAVENOUS

## 2013-03-24 NOTE — Anesthesia Preprocedure Evaluation (Addendum)
Anesthesia Evaluation  Patient identified by MRN, date of birth, ID band  Reviewed: Allergy & Precautions, H&P , NPO status , Patient's Chart, lab work & pertinent test results  Airway Mallampati: II TM Distance: >3 FB Neck ROM: Full    Dental  (+) Dental Advisory Given   Pulmonary sleep apnea ,          Cardiovascular hypertension, Pt. on medications and Pt. on home beta blockers + Peripheral Vascular Disease + dysrhythmias Atrial Fibrillation     Neuro/Psych Anxiety Depression    GI/Hepatic   Endo/Other  diabetes, Type 2  Renal/GU      Musculoskeletal   Abdominal   Peds  Hematology   Anesthesia Other Findings   Reproductive/Obstetrics                           Anesthesia Physical Anesthesia Plan  ASA: III  Anesthesia Plan: General   Post-op Pain Management:    Induction: Intravenous  Airway Management Planned:   Additional Equipment:   Intra-op Plan:   Post-operative Plan:   Informed Consent: I have reviewed the patients History and Physical, chart, labs and discussed the procedure including the risks, benefits and alternatives for the proposed anesthesia with the patient or authorized representative who has indicated his/her understanding and acceptance.   Dental advisory given  Plan Discussed with: CRNA  Anesthesia Plan Comments:         Anesthesia Quick Evaluation

## 2013-03-24 NOTE — Procedures (Signed)
Electrical Cardioversion Procedure Note Yeriel Mineo Muise [id number] Jul 15, 1966  Procedure: Electrical Cardioversion Indications:  Atrial Fibrillation  Procedure Details Consent: Risks of procedure as well as the alternatives and risks of each were explained to the (patient/caregiver).  Consent for procedure obtained. Time Out: Verified patient identification, verified procedure, site/side was marked, verified correct patient position, special equipment/implants available, medications/allergies/relevent history reviewed, required imaging and test results available.  Performed  Patient placed on cardiac monitor, pulse oximetry, supplemental oxygen as necessary.  Sedation given: Patient sedated by anesthesia with diprovan 100 mg IV by anesthesia. Pacer pads placed anterior and posterior chest.  Cardioverted 1 time(s).  Cardioverted at 120J.  Evaluation Findings: Post procedure EKG shows: NSR Complications: None Patient did tolerate procedure well.   Olga Millers 03/24/2013, 1:42 PM

## 2013-03-24 NOTE — H&P (Signed)
Dakota Hernandez  02/14/2013 11:30 AM   Office Visit  MRN:  [id number]   Description: 47 year old male  Provider: Lewayne Bunting, MD  Department: [facility]        Referring Provider    Tresa Garter, MD      Diagnoses    Atrial fibrillation    -  Primary    427.31    Congestive dilated cardiomyopathy        425.4      Reason for Visit    Atrial Fibrillation    New patient evaluation       Reason For Visit History Recorded         Progress Notes    Lewayne Bunting, MD at 02/14/2013 12:22 PM    Status: Signed                    HPI: 47 year old male for evaluation of atrial fibrillation. Patient has previously been followed by Dr. Lady Gary in [place]. The patient was noted to be in atrial fibrillation in December of 2012 prior to gastric bypass surgery. He was asymptomatic. Apparently was also noted previously to have a cardiomyopathy with an ejection fraction of 25-30%; also with moderate pulmonary hypertension. He had previous cardioversion in April of 2013. I do not have all of his records available. He apparently maintained sinus rhythm and there is a note from December of 2013 that states he was in sinus.  He was recently seen by his primary care physician and noted to have recurrent atrial fibrillation. Cardiology is asked to evaluate. He denies dyspnea, chest pain, palpitations or syncope.    Current Outpatient Prescriptions   Medication  Sig  Dispense  Refill   .  apixaban (ELIQUIS) 5 MG TABS tablet  Take 1 tablet (5 mg total) by mouth 2 (two) times daily.   60 tablet   11   .  Cholecalciferol (VITAMIN D3) 1000 UNITS CAPS  Take 1 capsule by mouth daily.           .  cyanocobalamin (COBAL-1000) 1000 MCG/ML injection  Inject 1 mL (1,000 mcg total) into the muscle every 14 (fourteen) days.   10 mL   6   .  ergocalciferol (VITAMIN D2) 50000 UNITS capsule  Take 1 capsule (50,000 Units total) by mouth once a week.   6 capsule   0       No current  facility-administered medications for this visit.         Allergies   Allergen  Reactions   .  Hydrochlorothiazide         REACTION: ED        Past Medical History   Diagnosis  Date   .  Hypertension     .  Obesity     .  Hypogonadism male     .  ED (erectile dysfunction)     .  Atrial fibrillation     .  Diabetes         Previously on glucophage; now on no meds         Past Surgical History   Procedure  Laterality  Date   .  Appendectomy    1981   .  Eye surgery    in 1st grade       Left eye   .  Laparoscopic gastric sleeve resection    8/13         History  Social History   .  Marital Status:  Married       Spouse Name:  [name]       Number of Children:  N   .  Years of Education:  N/A       Occupational History   .  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    [employer]       Social History Main Topics   .  Smoking status:  Never Smoker    .  Smokeless tobacco:  Not on file   .  Alcohol Use:  No   .  Drug Use:  Not on file   .  Sexually Active:  Yes       Other Topics  Concern   .  Not on file       Social History Narrative     Got married 08/2010          Regular Exercise- yes                   Family History   Problem  Relation  Age of Onset   .  Heart attack  Father  44   .  Heart disease  Father  92   .  Heart attack  Other         cousin   .  Heart disease  Other          ROS: no fevers or chills, productive cough, hemoptysis, dysphasia, odynophagia, melena, hematochezia, dysuria, hematuria, rash, seizure activity, orthopnea, PND, pedal edema, claudication. Remaining systems are negative.   Physical Exam:    Blood pressure 120/90, pulse 116, height 5\' 10"  (1.778 m), weight 263 lb 12.8 oz (119.659 kg).   General:  Well developed/well nourished in NAD Skin warm/dry Patient not depressed No peripheral clubbing Back-normal HEENT-normal/normal eyelids Neck supple/normal carotid upstroke bilaterally; no bruits; no JVD;  no thyromegaly chest - CTA/ normal expansion CV - tachycardic and irregular/normal S1 and S2; no murmurs, rubs or gallops;  PMI nondisplaced Abdomen -NT/ND, no HSM, no mass, + bowel sounds, no bruit 2+ femoral pulses, no bruits Ext-no edema, chords, 2+ DP Neuro-grossly nonfocal   ECG 01/12/2013-atrial fibrillation, nonspecific ST changes.   Electrocardiogram today shows atrial fibrillation with a back ventricular response. Heart rate is 116. Nonspecific ST changes.         Atrial fibrillation - Lewayne Bunting, MD at 02/14/2013 12:20 PM    Status: Written Related Problem: Atrial fibrillation           Patient has developed recurrent atrial fibrillation. His rate is elevated. Plan add Toprol 50 mg by mouth daily. Check TSH. Repeat echocardiogram. He has embolic risk factors of previous cardiomyopathy, previous diabetes and previous hypertension. Continue apixaban. Check hemoglobin and renal function. I will plan to repeat cardioversion 4 weeks from now. If he does not hold sinus rhythm we will need to decide between rate control/anticoagulation versus rhythm control. He is asymptomatic.         Congestive dilated cardiomyopathy - Lewayne Bunting, MD at 02/14/2013 12:22 PM    Status: Linus Orn Related Problem: Congestive dilated cardiomyopathy           Patient was previously noted to have a cardiomyopathy. Etiology was unclear. Question tachycardia mediated related to atrial fibrillation. Repeat echocardiogram. If LV function reduced we'll need further ischemia evaluation. Will also add ACE inhibitor if reduced. Obtain records from Dr America Brown office.       Patient  for DCCV; on apixaban; no changes Olga Millers

## 2013-03-24 NOTE — Transfer of Care (Signed)
Immediate Anesthesia Transfer of Care Note  Patient: Dakota Hernandez  Procedure(s) Performed: Procedure(s): CARDIOVERSION (N/A)  Patient Location: Endoscopy Unit  Anesthesia Type:General  Level of Consciousness: awake, alert , oriented and patient cooperative  Airway & Oxygen Therapy: Patient Spontanous Breathing and Patient connected to nasal cannula oxygen  Post-op Assessment: Report given to PACU RN, Post -op Vital signs reviewed and stable and Patient moving all extremities X 4  Post vital signs: Reviewed and stable  Complications: No apparent anesthesia complications

## 2013-03-24 NOTE — Anesthesia Postprocedure Evaluation (Signed)
  Anesthesia Post-op Note  Patient: Unknown Schleyer Astle  Procedure(s) Performed: Procedure(s): CARDIOVERSION (N/A)  Patient Location: Endoscopy Unit  Anesthesia Type:General  Level of Consciousness: awake, alert , oriented and patient cooperative  Airway and Oxygen Therapy: Patient Spontanous Breathing and Patient connected to nasal cannula oxygen  Post-op Pain: none  Post-op Assessment: Post-op Vital signs reviewed, Patient's Cardiovascular Status Stable, Respiratory Function Stable, Patent Airway, No signs of Nausea or vomiting, Pain level controlled, No headache, No backache, No residual numbness and No residual motor weakness  Post-op Vital Signs: Reviewed and stable  Complications: No apparent anesthesia complications

## 2013-03-24 NOTE — Preoperative (Signed)
Beta Blockers   Reason not to administer Beta Blockers:Not Applicable 

## 2013-03-28 ENCOUNTER — Encounter (HOSPITAL_COMMUNITY): Payer: Self-pay | Admitting: Cardiology

## 2013-04-03 ENCOUNTER — Other Ambulatory Visit: Payer: Self-pay | Admitting: *Deleted

## 2013-04-03 MED ORDER — APIXABAN 5 MG PO TABS: 5.0000 mg | ORAL_TABLET | Freq: Two times a day (BID) | ORAL | Status: DC

## 2013-04-10 ENCOUNTER — Encounter: Payer: Self-pay | Admitting: Cardiology

## 2013-04-10 ENCOUNTER — Ambulatory Visit (INDEPENDENT_AMBULATORY_CARE_PROVIDER_SITE_OTHER): Payer: 59 | Admitting: Cardiology

## 2013-04-10 VITALS — BP 154/99 | HR 49 | Ht 70.0 in | Wt 268.0 lb

## 2013-04-10 DIAGNOSIS — I4891 Unspecified atrial fibrillation: Secondary | ICD-10-CM

## 2013-04-10 DIAGNOSIS — I420 Dilated cardiomyopathy: Secondary | ICD-10-CM

## 2013-04-10 DIAGNOSIS — I10 Essential (primary) hypertension: Secondary | ICD-10-CM

## 2013-04-10 DIAGNOSIS — I428 Other cardiomyopathies: Secondary | ICD-10-CM

## 2013-04-10 MED ORDER — LISINOPRIL 10 MG PO TABS: 10.0000 mg | ORAL_TABLET | Freq: Every day | ORAL | Status: DC

## 2013-04-10 NOTE — Assessment & Plan Note (Signed)
Patient's ejection fraction is 30-35%. Question if this is related to atrial fibrillation with a rapid ventricular response. Continue Toprol at present dose. Add lisinopril 10 mg daily and increase as tolerated. Check potassium, renal function, hemoglobin and TSH in one week. I will also schedule him to have a nuclear study to screen for coronary disease. He would like to delay this until August because of insurance issues. We will plan to repeat his echocardiogram after his medications have been fully titrated. Hopefully with medications and now that sinus has been established his LV function will have improved. Otherwise we would need to consider ICD.

## 2013-04-10 NOTE — Progress Notes (Signed)
HPI: Pleasant male for evaluation of atrial fibrillation. Patient had previously been followed by Dr. Lady Gary in [place]. The patient was noted to be in atrial fibrillation in December of 2012 prior to gastric bypass surgery. He was asymptomatic. Apparently was also noted previously to have a cardiomyopathy with an ejection fraction of 25-30%; also with moderate pulmonary hypertension. He had previous cardioversion in April of 2013. I do not have all of his records available. He apparently maintained sinus rhythm and there is a note from December of 2013 that states he was in sinus. I last saw him in May at 2014 when he was again noted to be in atrial fibrillation. Toprol added. Echocardiogram repeated in May of 2014 and showed an ejection fraction of 30-35%, mild biatrial enlargement and mild mitral regurgitation. Patient had repeat cardioversion on March 24 2013 to sinus rhythm. Since then, the patient denies any dyspnea on exertion, orthopnea, PND, pedal edema, palpitations, syncope or chest pain. The patient feels he has improved energy since been cardioverted.    Current Outpatient Prescriptions  Medication Sig Dispense Refill  . apixaban (ELIQUIS) 5 MG TABS tablet Take 1 tablet (5 mg total) by mouth 2 (two) times daily.  180 tablet  3  . Cholecalciferol (VITAMIN D3) 1000 UNITS CAPS Take 1 capsule by mouth daily.        . cyanocobalamin (COBAL-1000) 1000 MCG/ML injection Inject 1 mL (1,000 mcg total) into the muscle every 14 (fourteen) days.  10 mL  6  . ergocalciferol (VITAMIN D2) 50000 UNITS capsule Take 1 capsule (50,000 Units total) by mouth once a week.  6 capsule  0  . metoprolol succinate (TOPROL-XL) 50 MG 24 hr tablet Take 1 tablet (50 mg total) by mouth daily. Take with or immediately following a meal.  30 tablet  12   No current facility-administered medications for this visit.     Past Medical History  Diagnosis Date  . Hypertension   . Obesity   . Hypogonadism male   . Atrial  fibrillation   . Diabetes     Previously on glucophage; now on no meds  . ED (erectile dysfunction)     Past Surgical History  Procedure Laterality Date  . Appendectomy  1981  . Laparoscopic gastric sleeve resection  8/13  . Eye surgery  in 1st grade    Left eye  . Cardioversion N/A 03/24/2013    Procedure: CARDIOVERSION;  Surgeon: Lewayne Bunting, MD;  Location: [facility] ENDOSCOPY;  Service: Cardiovascular;  Laterality: N/A;    History   Social History  . Marital Status: Married    Spouse Name: [name]    Number of Children: N  . Years of Education: N/A   Occupational History  . 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  [employer]   Social History Main Topics  . Smoking status: Never Smoker   . Smokeless tobacco: Not on file  . Alcohol Use: No  . Drug Use: No  . Sexually Active: Yes   Other Topics Concern  . Not on file   Social History Narrative   Got married 08/2010      Regular Exercise- yes          ROS: no fevers or chills, productive cough, hemoptysis, dysphasia, odynophagia, melena, hematochezia, dysuria, hematuria, rash, seizure activity, orthopnea, PND, pedal edema, claudication. Remaining systems are negative.  Physical Exam: Well-developed well-nourished in no acute distress.  Skin is warm and dry.  HEENT is normal.  Neck is supple.  Chest  is clear to auscultation with normal expansion.  Cardiovascular exam is regular rate and rhythm.  Abdominal exam nontender or distended. No masses palpated. Extremities show no edema. neuro grossly intact  ECG marked sinus bradycardia at a rate of 49. Nonspecific ST changes.

## 2013-04-10 NOTE — Assessment & Plan Note (Signed)
Blood pressure elevated. Add lisinopril 10 mg daily.

## 2013-04-10 NOTE — Patient Instructions (Signed)
Your physician wants you to follow-up in: 3 MONTHS WITH DR Jens Som You will receive a reminder letter in the mail two months in advance. If you don't receive a letter, please call our office to schedule the follow-up appointment.   START LISINOPRIL 10 MG ONCE DAILY  Your physician recommends that you return for lab work in: ONE WEEK  Your physician has requested that you have a lexiscan myoview. For further information please visit [email or web address] Please follow instruction sheet, as given. CALL TO SCHEDULE

## 2013-04-10 NOTE — Assessment & Plan Note (Signed)
Patient remains in sinus rhythm. Continue beta blocker. Continue apixaban. He does feel better in sinus rhythm. If he has recurrent atrial fibrillation in the future we will consider an antiarrhythmic to help hold sinus rhythm.

## 2013-04-12 ENCOUNTER — Other Ambulatory Visit: Payer: Self-pay | Admitting: *Deleted

## 2013-04-12 DIAGNOSIS — I4891 Unspecified atrial fibrillation: Secondary | ICD-10-CM

## 2013-04-12 MED ORDER — METOPROLOL SUCCINATE ER 50 MG PO TB24: 50.0000 mg | ORAL_TABLET | Freq: Every day | ORAL | Status: DC

## 2013-04-20 ENCOUNTER — Other Ambulatory Visit (INDEPENDENT_AMBULATORY_CARE_PROVIDER_SITE_OTHER): Payer: 59

## 2013-04-20 DIAGNOSIS — I4891 Unspecified atrial fibrillation: Secondary | ICD-10-CM

## 2013-04-20 DIAGNOSIS — I428 Other cardiomyopathies: Secondary | ICD-10-CM

## 2013-04-20 DIAGNOSIS — I420 Dilated cardiomyopathy: Secondary | ICD-10-CM

## 2013-04-20 DIAGNOSIS — I10 Essential (primary) hypertension: Secondary | ICD-10-CM

## 2013-04-20 LAB — CBC WITH DIFFERENTIAL/PLATELET
Basophils Relative: 0.6 % (ref 0.0–3.0)
Eosinophils Absolute: 0.1 10*3/uL (ref 0.0–0.7)
Eosinophils Relative: 1.7 % (ref 0.0–5.0)
HCT: 41.6 % (ref 39.0–52.0)
Hemoglobin: 14.3 g/dL (ref 13.0–17.0)
Lymphs Abs: 1.5 10*3/uL (ref 0.7–4.0)
MCHC: 34.4 g/dL (ref 30.0–36.0)
MCV: 90.1 fl (ref 78.0–100.0)
Monocytes Absolute: 0.5 10*3/uL (ref 0.1–1.0)
Neutro Abs: 2.8 10*3/uL (ref 1.4–7.7)
Neutrophils Relative %: 57.6 % (ref 43.0–77.0)
RBC: 4.62 Mil/uL (ref 4.22–5.81)
WBC: 4.9 10*3/uL (ref 4.5–10.5)

## 2013-04-20 LAB — BASIC METABOLIC PANEL
CO2: 28 mEq/L (ref 19–32)
Chloride: 105 mEq/L (ref 96–112)
Creatinine, Ser: 1.1 mg/dL (ref 0.4–1.5)
Potassium: 4 mEq/L (ref 3.5–5.1)

## 2013-07-20 ENCOUNTER — Encounter: Payer: 59 | Admitting: Internal Medicine

## 2013-07-20 DIAGNOSIS — Z0289 Encounter for other administrative examinations: Secondary | ICD-10-CM

## 2014-04-23 ENCOUNTER — Other Ambulatory Visit: Payer: Self-pay | Admitting: Cardiology

## 2014-04-23 NOTE — Telephone Encounter (Signed)
Okay to refill and will need follow up appt Thanks for all you do.

## 2014-04-24 NOTE — Telephone Encounter (Signed)
This came to me. I am not sure who you were trying to forward this to. Hope you are doing well -- we miss you!!!

## 2014-05-15 ENCOUNTER — Ambulatory Visit (INDEPENDENT_AMBULATORY_CARE_PROVIDER_SITE_OTHER): Payer: BC Managed Care – PPO | Admitting: Cardiology

## 2014-05-15 ENCOUNTER — Encounter: Payer: Self-pay | Admitting: Cardiology

## 2014-05-15 VITALS — BP 130/90 | HR 61 | Ht 70.0 in | Wt 331.8 lb

## 2014-05-15 DIAGNOSIS — I10 Essential (primary) hypertension: Secondary | ICD-10-CM

## 2014-05-15 DIAGNOSIS — I428 Other cardiomyopathies: Secondary | ICD-10-CM

## 2014-05-15 DIAGNOSIS — I420 Dilated cardiomyopathy: Secondary | ICD-10-CM

## 2014-05-15 NOTE — Patient Instructions (Signed)
Your physician has requested that you have an echocardiogram. Echocardiography is a painless test that uses sound waves to create images of your heart. It provides your doctor with information about the size and shape of your heart and how well your heart's chambers and valves are working. This procedure takes approximately one hour. There are no restrictions for this procedure.  Continue other medications  Start Eliquis 5 mg twice a day   Your physician wants you to follow-up in: 6 months with Dr.Crenshaw. You will receive a reminder letter in the mail two months in advance. If you don't receive a letter, please call our office to schedule the follow-up appointment.

## 2014-05-15 NOTE — Progress Notes (Signed)
05/17/2014 Dakota Hernandez   11/13/1965  [id number]  Primary Physicia Sonda PrimesAlex Plotnikov, MD Primary Cardiologist: Dr. [name]  HPI:  The patient is a 48 year old male, followed by Dr. [name]. He presents for routine followup and for refills of his medicatios. He was last seen and examined by Dr. [name] on 04/10/2013. He has a history of paroxysmal atrial fibrillation and has required several cardioversions. His last cardioversion was in June 2014. This was successful and he was converted back to sinus rhythm. He is on rate control therapy with Toprol XL. He is on oral anticoagulation with Eliquis (CHADS Vasc score is 2: HTN + CHF). He has a history of cardiomyopathy with last documented ejection fraction of 30-35% on 2-D echo in May of 2014. It is unsure whether or not this is secondary to atrial fibrillation with rapid ventricular response. Dr. [name] ordered for him to undergo nuclear stress testing to rule out ischemia, however the patient never followed through with this order. He also instructed for him to undergo a repeat 2-D echocardiogram to reassess systolic function after being medicated with beta blocker therapy as well as an ACE inhibitor. This was never done as well. Patient states that he was without insurance at that time. He is now insured through [employer].  Today in clinic, he states that he has felt well. He denies any symptoms of palpitations, lightheadedness, dizziness, syncope/near-syncope, chest discomfort or dyspnea. No weight gain, orthopnea, PND or lower extremity edema.   His EKG today in clinic demonstrates sinus rhythm. Heart rate 61 beats per minute. Blood pressure is 130/90.  Current Outpatient Prescriptions  Medication Sig Dispense Refill  . Cholecalciferol (VITAMIN D3) 1000 UNITS CAPS Take 1 capsule by mouth daily.        . cyanocobalamin (COBAL-1000) 1000 MCG/ML injection Inject 1 mL (1,000 mcg total) into the muscle every 14 (fourteen) days.   10 mL  6  . ergocalciferol (VITAMIN D2) 50000 UNITS capsule Take 1 capsule (50,000 Units total) by mouth once a week.  6 capsule  0  . lisinopril (PRINIVIL,ZESTRIL) 10 MG tablet TAKE ONE TABLET BY MOUTH ONCE DAILY  30 tablet  0  . metoprolol succinate (TOPROL-XL) 50 MG 24 hr tablet TAKE ONE TABLET BY MOUTH ONCE DAILY WITH MEALS  30 tablet  0   No current facility-administered medications for this visit.    Allergies  Allergen Reactions  . Hydrochlorothiazide     REACTION: ED    History   Social History  . Marital Status: Married    Spouse Name: [name]    Number of Children: N  . Years of Education: N/A   Occupational History  . Assistant Manager/Retail Dakota Hernandez  .  Dakota Hernandez   Social History Main Topics  . Smoking status: Never Smoker   . Smokeless tobacco: Not on file  . Alcohol Use: No  . Drug Use: No  . Sexual Activity: Yes   Other Topics Concern  . Not on file   Social History Narrative   Got married 08/2010      Regular Exercise- yes           Review of Systems: General: negative for chills, fever, night sweats or weight changes.  Cardiovascular: negative for chest pain, dyspnea on exertion, edema, orthopnea, palpitations, paroxysmal nocturnal dyspnea or shortness of breath Dermatological: negative for rash Respiratory: negative for cough or wheezing Urologic: negative for hematuria Abdominal: negative for nausea, vomiting, diarrhea, bright red blood per rectum, melena, or  hematemesis Neurologic: negative for visual changes, syncope, or dizziness All other systems reviewed and are otherwise negative except as noted above.    Blood pressure 130/90, pulse 61, height 5\' 10"  (1.778 m), weight 331 lb 12.8 oz (150.503 kg).  General appearance: alert, cooperative and no distress Neck: no carotid bruit and no JVD Lungs: clear to auscultation bilaterally Heart: regular rate and rhythm, S1, S2 normal, no murmur, click, rub or gallop Extremities: no LEE Pulses: 2+  and symmetric Skin: warm and dry Neurologic: Grossly normal  EKG NSR HR 61 bpm  ASSESSMENT AND PLAN:   1. Paroxysmal atrial fibrillation: EKG demonstrates normal sinus rhythm. Heart rate 61 beats. Denies any awareness of any recurrence of any arrhythmias. Continue rate control therapy with Toprol-XL 25 mg. Continue stroke prophylaxis with Eliquis, 5 mg twice a day as CHADS VASc score is 2 (HTN + CHF).  2. Cardiomyopathy: Last documented EF was 30-35% via a 2-D echo in 2014. Since that time, he has been on both a beta blocker and ACE inhibitor. He was instructed to undergo repeat evaluation with a 2-D echocardiogram one year ago, however he did not follow along with order due to lack of insurance. He is now insured. He has been ordered to get a repeat 2-D echocardiogram as well as a nuclear stress test (also recommended by Dr. Jens Som one year ago) to rule out ischemia.   PLAN  Continue current plan of care. Refills of his metoprolol and Eliquis were given. He was also given samples of Eliquis as well as a monthly discount card. Plan for 2D echo and NST. F/u with Dr. Jens Som.   [name], BRITTAINYPA-C 05/17/2014 7:11 PM

## 2014-05-17 ENCOUNTER — Encounter: Payer: Self-pay | Admitting: Cardiology

## 2014-05-22 ENCOUNTER — Telehealth (HOSPITAL_COMMUNITY): Payer: Self-pay

## 2014-05-22 NOTE — Telephone Encounter (Signed)
Encounter complete. 

## 2014-05-24 ENCOUNTER — Ambulatory Visit (HOSPITAL_COMMUNITY)
Admission: RE | Admit: 2014-05-24 | Discharge: 2014-05-24 | Disposition: A | Discharge status: Home / Self Care | Payer: BC Managed Care – PPO | Source: Ambulatory Visit | Attending: Cardiology | Admitting: Cardiology

## 2014-05-24 DIAGNOSIS — Z8249 Family history of ischemic heart disease and other diseases of the circulatory system: Secondary | ICD-10-CM | POA: Insufficient documentation

## 2014-05-24 DIAGNOSIS — R42 Dizziness and giddiness: Secondary | ICD-10-CM | POA: Diagnosis not present

## 2014-05-24 DIAGNOSIS — E669 Obesity, unspecified: Secondary | ICD-10-CM | POA: Diagnosis not present

## 2014-05-24 DIAGNOSIS — I10 Essential (primary) hypertension: Secondary | ICD-10-CM | POA: Insufficient documentation

## 2014-05-24 DIAGNOSIS — I4891 Unspecified atrial fibrillation: Secondary | ICD-10-CM | POA: Insufficient documentation

## 2014-05-24 DIAGNOSIS — I420 Dilated cardiomyopathy: Secondary | ICD-10-CM

## 2014-05-24 DIAGNOSIS — I428 Other cardiomyopathies: Secondary | ICD-10-CM

## 2014-05-24 MED ORDER — TECHNETIUM TC 99M SESTAMIBI GENERIC - CARDIOLITE
36.8000 | Freq: Once | INTRAVENOUS | Status: AC | PRN
  Administered 2014-05-24: 36.8 via INTRAVENOUS

## 2014-05-24 MED ORDER — TECHNETIUM TC 99M SESTAMIBI GENERIC - CARDIOLITE
12.6000 | Freq: Once | INTRAVENOUS | Status: AC | PRN
  Administered 2014-05-24: 13 via INTRAVENOUS

## 2014-05-24 NOTE — Procedures (Addendum)
[facility] [address] [place] [address] [phone]  Cardiology Nuclear Med Study  Dakota Hernandez is a 48 y.o. male     MRN : [id number]     DOB: 1966-04-18  Procedure Date: 05/24/2014  Nuclear Med Background Indication for Stress Test:  Evaluation for Ischemia History:  PAF w/multiple cardioversions;cardiomyopathy;No prior respiratory history reported;No prior NUC MPI for comparison. Cardiac Risk Factors: Family History - CAD, Hypertension and Obesity  Symptoms:  Light-Headedness   Nuclear Pre-Procedure Caffeine/Decaff Intake:  7:00pm NPO After: 5:00am   IV Site: R Hand  IV 0.9% NS with Angio Cath:  22g  Chest Size (in):  52"  IV Started by: Berdie Ogren, RN  Height: 5\' 10"  (1.778 m)  Cup Size: n/a  BMI:  Body mass index is 47.49 kg/(m^2). Weight:  331 lb (150.141 kg)   Tech Comments:  n/a    Nuclear Med Study 1 or 2 day study: 1 day  Stress Test Type:  Stress  Order Authorizing Provider:  Olga Millers, MD   Resting Radionuclide: Technetium 21m Sestamibi  Resting Radionuclide Dose: 12.6 mCi   Stress Radionuclide:  Technetium 67m Sestamibi  Stress Radionuclide Dose: 36.8 mCi           Stress Protocol Rest HR: 65 Stress HR: 155  Rest BP: 146/91 Stress BP: 197/96  Exercise Time (min): 6:36 METS: 7.20   Predicted Max HR: 174 bpm % Max HR: 89.08 bpm Rate Pressure Product: 15953  Dose of Adenosine (mg):  n/a Dose of Lexiscan: n/a mg  Dose of Atropine (mg): n/a Dose of Dobutamine: n/a mcg/kg/min (at max HR)  Stress Test Technologist: Ernestene Mention, CCT Nuclear Technologist: Gonzella Lex, CNMT   Rest Procedure:  Myocardial perfusion imaging was performed at rest 45 minutes following the intravenous administration of Technetium 39m Sestamibi. Stress Procedure:  The patient performed treadmill exercise using a Bruce  Protocol for 6 minutes 36 seconds. The patient stopped due to fatigue and shortness of  breath. Patient denied any chest pain.  There were no significant ST-T wave changes.  Technetium 66m Sestamibi was injected at peak exercise and myocardial perfusion imaging was performed after a brief delay.  Transient Ischemic Dilatation (Normal <1.22):  1.12  QGS EDV:  152 ml QGS ESV:  75 ml LV Ejection Fraction: 51%        Rest ECG: NSR - Normal EKG  Stress ECG: No significant change from baseline ECG  QPS Raw Data Images:  Normal; no motion artifact; normal heart/lung ratio. Stress Images:  Normal homogeneous uptake in all areas of the myocardium. Rest Images:  Normal homogeneous uptake in all areas of the myocardium. Subtraction (SDS):  No evidence of ischemia.  Impression Exercise Capacity:  Good exercise capacity. BP Response:  Normal blood pressure response. Clinical Symptoms:  No significant symptoms noted. ECG Impression:  No significant ST segment change suggestive of ischemia. Comparison with Prior Nuclear Study: No images to compare  Overall Impression:  Normal stress nuclear study.  LV Wall Motion:  Low normal LV fxn   Runell Gess, MD  05/24/2014 10:22 AM

## 2014-05-28 ENCOUNTER — Ambulatory Visit (HOSPITAL_COMMUNITY)
Admission: RE | Admit: 2014-05-28 | Discharge: 2014-05-28 | Disposition: A | Discharge status: Home / Self Care | Payer: BC Managed Care – PPO | Source: Ambulatory Visit | Attending: Cardiology | Admitting: Cardiology

## 2014-05-28 DIAGNOSIS — I10 Essential (primary) hypertension: Secondary | ICD-10-CM

## 2014-05-28 DIAGNOSIS — I517 Cardiomegaly: Secondary | ICD-10-CM

## 2014-05-28 DIAGNOSIS — I428 Other cardiomyopathies: Secondary | ICD-10-CM | POA: Insufficient documentation

## 2014-05-28 DIAGNOSIS — I420 Dilated cardiomyopathy: Secondary | ICD-10-CM

## 2014-05-28 NOTE — Progress Notes (Signed)
2D Echo Performed 05/28/2014    Mahin Guardia, RCS  

## 2014-05-29 ENCOUNTER — Telehealth: Payer: Self-pay | Admitting: Cardiology

## 2014-05-29 MED ORDER — APIXABAN 5 MG PO TABS: 5.0000 mg | ORAL_TABLET | Freq: Two times a day (BID) | ORAL | Status: DC

## 2014-05-29 MED ORDER — METOPROLOL SUCCINATE ER 50 MG PO TB24: 50.0000 mg | ORAL_TABLET | Freq: Every day | ORAL | Status: DC

## 2014-05-29 MED ORDER — LISINOPRIL 10 MG PO TABS: 10.0000 mg | ORAL_TABLET | Freq: Every day | ORAL | Status: DC

## 2014-05-29 NOTE — Telephone Encounter (Signed)
Spoke with pt, aware of nuclear results Scripts sent to the pharm

## 2014-05-29 NOTE — Telephone Encounter (Signed)
Deferred to Deliah Goody

## 2014-05-29 NOTE — Telephone Encounter (Signed)
Calling about his stress test results as well as a prescription that was supposed to be sent to to the Pharmacy has not been sent yet .[name] Please call ...   Thanks

## 2014-05-30 DIAGNOSIS — H53032 Strabismic amblyopia, left eye: Secondary | ICD-10-CM | POA: Insufficient documentation

## 2014-05-30 DIAGNOSIS — H5203 Hypermetropia, bilateral: Secondary | ICD-10-CM | POA: Insufficient documentation

## 2014-05-30 DIAGNOSIS — H52203 Unspecified astigmatism, bilateral: Secondary | ICD-10-CM | POA: Insufficient documentation

## 2014-07-17 NOTE — Telephone Encounter (Signed)
error 

## 2014-11-01 ENCOUNTER — Encounter: Payer: Self-pay | Admitting: Cardiology

## 2015-06-16 ENCOUNTER — Other Ambulatory Visit: Payer: Self-pay | Admitting: Cardiology

## 2015-06-17 ENCOUNTER — Telehealth: Payer: Self-pay

## 2015-06-17 NOTE — Telephone Encounter (Signed)
Patient requesting a refill of Lisinopril. Has not seen Dr. Jens Som since 04/2013. Left him a voicemail message to call and schedule an appointment.

## 2015-07-01 ENCOUNTER — Other Ambulatory Visit: Payer: Self-pay | Admitting: Cardiology

## 2015-08-01 ENCOUNTER — Other Ambulatory Visit: Payer: Self-pay | Admitting: Cardiology

## 2015-08-05 NOTE — Progress Notes (Signed)
HPI: FU atrial fibrillation. Patient had previously been followed by Dr. Lady Gary in [place]. The patient was noted to be in atrial fibrillation in December of 2012 prior to gastric bypass surgery. He was asymptomatic. Apparently was also noted previously to have a cardiomyopathy with an ejection fraction of 25-30%; also with moderate pulmonary hypertension. He had previous cardioversion in April of 2013. I saw him in May at 2014 when he was again noted to be in atrial fibrillation. Toprol added. Patient had repeat cardioversion on March 24 2013 to sinus rhythm. Last echocardiogram August 2015 showed low normal LV function and mild left ventricular hypertrophy. Nuclear study August 2015 showed ejection fraction 51% and normal perfusion. Since last seen, Patient denies dyspnea, chest pain, palpitations or syncope. He is not taking his apixaban due to cost.  Current Outpatient Prescriptions  Medication Sig Dispense Refill  . Cholecalciferol (VITAMIN D3) 1000 UNITS CAPS Take 1 capsule by mouth daily.      . cyanocobalamin (COBAL-1000) 1000 MCG/ML injection Inject 1 mL (1,000 mcg total) into the muscle every 14 (fourteen) days. 10 mL 6  . ergocalciferol (VITAMIN D2) 50000 UNITS capsule Take 1 capsule (50,000 Units total) by mouth once a week. 6 capsule 0  . lisinopril (PRINIVIL,ZESTRIL) 10 MG tablet TAKE ONE TABLET BY MOUTH DAILY 30 tablet 1  . metoprolol succinate (TOPROL-XL) 50 MG 24 hr tablet TAKE ONE TABLET BY MOUTH ONCE DAILY. TAKE WITH OR IMMEDIATELY FOLLOWING A MEAL. 30 tablet 0  . omeprazole (PRILOSEC OTC) 20 MG tablet Take 20 mg by mouth daily.    [name] apixaban (ELIQUIS) 5 MG TABS tablet Take 1 tablet (5 mg total) by mouth 2 (two) times daily. (Patient not taking: Reported on 08/06/2015) 60 tablet 6   No current facility-administered medications for this visit.     Past Medical History  Diagnosis Date  . Hypertension   . Obesity   . Hypogonadism male   . Atrial fibrillation (HCC)   .  Diabetes (HCC)     Previously on glucophage; now on no meds  . ED (erectile dysfunction)     Past Surgical History  Procedure Laterality Date  . Appendectomy  1981  . Laparoscopic gastric sleeve resection  8/13  . Eye surgery  in 1st grade    Left eye  . Cardioversion N/A 03/24/2013    Procedure: CARDIOVERSION;  Surgeon: Lewayne Bunting, MD;  Location: [facility] ENDOSCOPY;  Service: Cardiovascular;  Laterality: N/A;    Social History   Social History  . Marital Status: Married    Spouse Name: [name]  . Number of Children: N  . Years of Education: N/A   Occupational History  . 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  [employer]   Social History Main Topics  . Smoking status: Never Smoker   . Smokeless tobacco: Not on file  . Alcohol Use: No  . Drug Use: No  . Sexual Activity: Yes   Other Topics Concern  . Not on file   Social History Narrative   Got married 08/2010      Regular Exercise- yes          ROS: no fevers or chills, productive cough, hemoptysis, dysphasia, odynophagia, melena, hematochezia, dysuria, hematuria, rash, seizure activity, orthopnea, PND, pedal edema, claudication. Remaining systems are negative.  Physical Exam: Well-developed obese in no acute distress.  Skin is warm and dry.  HEENT is normal.  Neck is supple.  Chest is clear to auscultation with normal expansion.  Cardiovascular exam is irregular and tachycardic Abdominal exam nontender or distended. No masses palpated. Extremities show no edema. neuro grossly intact  ECG Atrial fibrillation at a rate of 117. Nonspecific ST changes.

## 2015-08-06 ENCOUNTER — Other Ambulatory Visit: Payer: Self-pay | Admitting: Cardiology

## 2015-08-06 ENCOUNTER — Encounter: Payer: Self-pay | Admitting: Cardiology

## 2015-08-06 ENCOUNTER — Ambulatory Visit (INDEPENDENT_AMBULATORY_CARE_PROVIDER_SITE_OTHER): Payer: Managed Care, Other (non HMO) | Admitting: Cardiology

## 2015-08-06 DIAGNOSIS — I480 Paroxysmal atrial fibrillation: Secondary | ICD-10-CM | POA: Diagnosis not present

## 2015-08-06 DIAGNOSIS — G4733 Obstructive sleep apnea (adult) (pediatric): Secondary | ICD-10-CM | POA: Diagnosis not present

## 2015-08-06 MED ORDER — APIXABAN 5 MG PO TABS: 5.0000 mg | ORAL_TABLET | Freq: Two times a day (BID) | ORAL | Status: DC

## 2015-08-06 MED ORDER — LISINOPRIL 20 MG PO TABS: 20.0000 mg | ORAL_TABLET | Freq: Every day | ORAL | Status: DC

## 2015-08-06 MED ORDER — METOPROLOL SUCCINATE ER 100 MG PO TB24: 100.0000 mg | ORAL_TABLET | Freq: Every day | ORAL | Status: DC

## 2015-08-06 NOTE — Assessment & Plan Note (Signed)
I encouraged patient to be compliantWith CPAP. He has not been using this at home.

## 2015-08-06 NOTE — Assessment & Plan Note (Addendum)
Blood pressure Elevated. Increase Toprol to 100 mg daily. Increase lisinopril to 20 mg daily. Check potassium and renal function in 1 week.

## 2015-08-06 NOTE — Telephone Encounter (Signed)
°  STAT if patient is at the pharmacy , call can be transferred to refill team.   1. Which medications need to be refilled? Elquis , Lisinopril and Metoprolol --Needs new Prescription sent   2. Which pharmacy/location is medication to be sent to?[facility]   3. Do they need a 30 day or 90 day supply?90

## 2015-08-06 NOTE — Assessment & Plan Note (Signed)
Discussed weight loss. 

## 2015-08-06 NOTE — Assessment & Plan Note (Addendum)
Patient has developed recurrent atrial fibrillation. Rate is elevated.Increase Toprol to 100 mg daily. He is not taking apixaban. Embolic risk factors include hypertension and prior congestive heart failure. We will resume apixaban 5 mg BID. Once fully anticoagulated for 3 consecutive weeks we will plan to repeat cardioversion. Hopefully he will hold sinus rhythm. If not I would favor rate control and anticoagulation as he is relatively asymptomatic. Repeat echocardiogram and TSH.

## 2015-08-06 NOTE — Assessment & Plan Note (Addendum)
LV function improved on most recent echocardiogram. Continue ACE inhibitor and beta blocker. Previous nuclear study showed no ischemia. Repeat echocardiogram.

## 2015-08-06 NOTE — Patient Instructions (Signed)
Medication Instructions:   INCREASE METOPROLOL TO 100 MG ONCE DAILY= 2 OF THE 50 MG TABLETS ONCE DAILY  INCREASE LISINOPRIL TO 20 MG ONCE DAILY= 2 OF THE 10 MG ONCE DAILY  RESTART ELIQUIS 5 MG ONE TABLET TWICE DAILY  Labwork:  Your physician recommends that you return for lab work in:ONE WEEK  Testing/Procedures:  Your physician has requested that you have an echocardiogram. Echocardiography is a painless test that uses sound waves to create images of your heart. It provides your doctor with information about the size and shape of your heart and how well your heart's chambers and valves are working. This procedure takes approximately one hour. There are no restrictions for this procedure.    Follow-Up:  Your physician recommends that you schedule a follow-up appointment in: 6 WEEKS WITH DR Jens Som   If you need a refill on your cardiac medications before your next appointment, please call your pharmacy.

## 2015-08-06 NOTE — Telephone Encounter (Signed)
Medication sent to the pharmacy for 90 day supply.

## 2015-08-12 ENCOUNTER — Telehealth: Payer: Self-pay | Admitting: Cardiology

## 2015-08-12 DIAGNOSIS — I480 Paroxysmal atrial fibrillation: Secondary | ICD-10-CM

## 2015-08-12 MED ORDER — LISINOPRIL 20 MG PO TABS: 20.0000 mg | ORAL_TABLET | Freq: Every day | ORAL | Status: DC

## 2015-08-12 MED ORDER — METOPROLOL SUCCINATE ER 100 MG PO TB24: 100.0000 mg | ORAL_TABLET | Freq: Every day | ORAL | Status: DC

## 2015-08-12 NOTE — Telephone Encounter (Signed)
Pt called in stating that his Lisinopril and Metoprolol was sent to the wrong pharmacy. He needs these called in to the [facility] in [place] . Please contact pt once this is done.   Thanks

## 2015-08-12 NOTE — Telephone Encounter (Signed)
Rx(s) sent to pharmacy electronically. Patient notified. 

## 2015-08-13 ENCOUNTER — Ambulatory Visit (HOSPITAL_COMMUNITY): Payer: Managed Care, Other (non HMO) | Attending: Cardiovascular Disease

## 2015-08-13 ENCOUNTER — Other Ambulatory Visit: Payer: Self-pay

## 2015-08-13 DIAGNOSIS — I4891 Unspecified atrial fibrillation: Secondary | ICD-10-CM | POA: Diagnosis present

## 2015-08-13 DIAGNOSIS — I10 Essential (primary) hypertension: Secondary | ICD-10-CM | POA: Diagnosis not present

## 2015-08-13 DIAGNOSIS — E119 Type 2 diabetes mellitus without complications: Secondary | ICD-10-CM | POA: Insufficient documentation

## 2015-08-13 DIAGNOSIS — I480 Paroxysmal atrial fibrillation: Secondary | ICD-10-CM | POA: Diagnosis not present

## 2015-08-13 DIAGNOSIS — I340 Nonrheumatic mitral (valve) insufficiency: Secondary | ICD-10-CM | POA: Insufficient documentation

## 2015-08-13 DIAGNOSIS — I517 Cardiomegaly: Secondary | ICD-10-CM | POA: Diagnosis not present

## 2015-08-13 DIAGNOSIS — I071 Rheumatic tricuspid insufficiency: Secondary | ICD-10-CM | POA: Diagnosis not present

## 2015-08-14 LAB — CBC
HCT: 45.6 % (ref 39.0–52.0)
Hemoglobin: 15.5 g/dL (ref 13.0–17.0)
MCH: 30 pg (ref 26.0–34.0)
MCHC: 34 g/dL (ref 30.0–36.0)
MCV: 88.4 fL (ref 78.0–100.0)
MPV: 9.8 fL (ref 8.6–12.4)
PLATELETS: 195 10*3/uL (ref 150–400)
RBC: 5.16 MIL/uL (ref 4.22–5.81)
RDW: 13.8 % (ref 11.5–15.5)
WBC: 5.3 10*3/uL (ref 4.0–10.5)

## 2015-08-15 LAB — BASIC METABOLIC PANEL
BUN: 17 mg/dL (ref 7–25)
CO2: 24 mmol/L (ref 20–31)
CREATININE: 1.05 mg/dL (ref 0.60–1.35)
Calcium: 8.4 mg/dL — ABNORMAL LOW (ref 8.6–10.3)
Chloride: 108 mmol/L (ref 98–110)
Glucose, Bld: 97 mg/dL (ref 65–99)
Potassium: 4 mmol/L (ref 3.5–5.3)
Sodium: 141 mmol/L (ref 135–146)

## 2015-08-15 LAB — TSH: TSH: 1.132 u[IU]/mL (ref 0.350–4.500)

## 2015-09-09 NOTE — Progress Notes (Signed)
      HPI: FU atrial fibrillation. Patient had previously been followed by Dr. Lady Gary in [place]. The patient was noted to be in atrial fibrillation in December of 2012 prior to gastric bypass surgery. He was asymptomatic. Apparently was also noted previously to have a cardiomyopathy with an ejection fraction of 25-30%; also with moderate pulmonary hypertension. He has had previous cardioversions. Patient had repeat cardioversion on March 24 2013 to sinus rhythm. Echocardiogram August 2015 showed low normal LV function and mild left ventricular hypertrophy. Nuclear study August 2015 showed ejection fraction 51% and normal perfusion. At last office visit November 2016 he was in recurrent atrial fibrillation. He was not taking anticoagulation but this was resumed. Toprol was increased. Echocardiogram repeated November 2016 and showed ejection fraction 40-45%, mild mitral regurgitation, moderate left atrial enlargement. Since last seen, There is no dyspnea, chest pain, palpitations or syncope.  Current Outpatient Prescriptions  Medication Sig Dispense Refill  . apixaban (ELIQUIS) 5 MG TABS tablet Take 1 tablet (5 mg total) by mouth 2 (two) times daily. 180 tablet 1  . Cholecalciferol (VITAMIN D3) 1000 UNITS CAPS Take 1 capsule by mouth daily.      [name] lisinopril (PRINIVIL,ZESTRIL) 20 MG tablet Take 1 tablet (20 mg total) by mouth daily. 90 tablet 3  . metoprolol succinate (TOPROL-XL) 100 MG 24 hr tablet Take 1 tablet (100 mg total) by mouth daily. Take with or immediately following a meal. 90 tablet 3  . omeprazole (PRILOSEC OTC) 20 MG tablet Take 20 mg by mouth daily.     No current facility-administered medications for this visit.     Past Medical History  Diagnosis Date  . Hypertension   . Obesity   . Hypogonadism male   . Atrial fibrillation (HCC)   . Diabetes (HCC)     Previously on glucophage; now on no meds  . ED (erectile dysfunction)     Past Surgical History  Procedure Laterality  Date  . Appendectomy  1981  . Laparoscopic gastric sleeve resection  8/13  . Eye surgery  in 1st grade    Left eye  . Cardioversion N/A 03/24/2013    Procedure: CARDIOVERSION;  Surgeon: Lewayne Bunting, MD;  Location: [facility] ENDOSCOPY;  Service: Cardiovascular;  Laterality: N/A;    Social History   Social History  . Marital Status: Married    Spouse Name: [name]  . Number of Children: N  . Years of Education: N/A   Occupational History  . 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  [employer]   Social History Main Topics  . Smoking status: Never Smoker   . Smokeless tobacco: Not on file  . Alcohol Use: No  . Drug Use: No  . Sexual Activity: Yes   Other Topics Concern  . Not on file   Social History Narrative   Got married 08/2010      Regular Exercise- yes          ROS: no fevers or chills, productive cough, hemoptysis, dysphasia, odynophagia, melena, hematochezia, dysuria, hematuria, rash, seizure activity, orthopnea, PND, pedal edema, claudication. Remaining systems are negative.  Physical Exam: Well-developed obese in no acute distress.  Skin is warm and dry.  HEENT is normal.  Neck is supple.  Chest is clear to auscultation with normal expansion.  Cardiovascular exam is irregular Abdominal exam nontender or distended. No masses palpated. Extremities show trace edema. neuro grossly intact

## 2015-09-10 ENCOUNTER — Ambulatory Visit (INDEPENDENT_AMBULATORY_CARE_PROVIDER_SITE_OTHER): Payer: Managed Care, Other (non HMO) | Admitting: Cardiology

## 2015-09-10 ENCOUNTER — Encounter: Payer: Self-pay | Admitting: Cardiology

## 2015-09-10 VITALS — BP 122/70 | HR 84 | Ht 70.0 in | Wt 324.8 lb

## 2015-09-10 DIAGNOSIS — G4733 Obstructive sleep apnea (adult) (pediatric): Secondary | ICD-10-CM | POA: Diagnosis not present

## 2015-09-10 DIAGNOSIS — I420 Dilated cardiomyopathy: Secondary | ICD-10-CM

## 2015-09-10 DIAGNOSIS — I4891 Unspecified atrial fibrillation: Secondary | ICD-10-CM

## 2015-09-10 NOTE — Patient Instructions (Signed)
Your physician has recommended that you wear a holter monitor. Holter monitors are medical devices that record the heart's electrical activity. Doctors most often use these monitors to diagnose arrhythmias. Arrhythmias are problems with the speed or rhythm of the heartbeat. The monitor is a small, portable device. You can wear one while you do your normal daily activities. This is usually used to diagnose what is causing palpitations/syncope (passing out).[address]  Your physician recommends that you schedule a follow-up appointment in: 3 MONTHS WITH DR Jens Som

## 2015-09-10 NOTE — Assessment & Plan Note (Signed)
Patient remains in atrial fibrillation today on examination. His rate is much improved. I discussed options of rate control versus rhythm control today. He is asymptomatic and has developed recurrent atrial fibrillation following 2 previous cardioversions. I would therefore favor rate control. We will continue Toprol at present dose. I will schedule a Holter monitor. We will make further adjustments in his AV nodal blocking agents based on those results. Embolic risk factors include prior CHF, hypertension; CHADSvasc 2; continue apixaban. Check hemoglobin and renal function when he returns in 3 months. If he develops symptoms in the next several months we will reconsider cardioverting.

## 2015-09-10 NOTE — Assessment & Plan Note (Signed)
Discussed weight loss. 

## 2015-09-10 NOTE — Assessment & Plan Note (Signed)
I again encouraged weight loss and compliance with CPAP.

## 2015-09-10 NOTE — Assessment & Plan Note (Signed)
Echocardiogram shows LV function 40-45%. Likely secondary to tachycardia. Previous nuclear study negative. Heart rate now improved. Continue beta blocker and ACE inhibitor. Further adjustment of beta blocker based on Holter monitor results. Once heart rate controlled we will plan to repeat echocardiogram 3 months later.

## 2015-09-10 NOTE — Assessment & Plan Note (Signed)
Blood pressure controlled. Continue present medications. 

## 2015-09-11 ENCOUNTER — Ambulatory Visit (INDEPENDENT_AMBULATORY_CARE_PROVIDER_SITE_OTHER): Payer: Managed Care, Other (non HMO)

## 2015-09-11 DIAGNOSIS — I4891 Unspecified atrial fibrillation: Secondary | ICD-10-CM | POA: Diagnosis not present

## 2015-09-17 ENCOUNTER — Telehealth: Payer: Self-pay | Admitting: *Deleted

## 2015-09-17 DIAGNOSIS — I480 Paroxysmal atrial fibrillation: Secondary | ICD-10-CM

## 2015-09-17 NOTE — Telephone Encounter (Signed)
Left message for pt to call.

## 2015-09-17 NOTE — Telephone Encounter (Signed)
-----   Message from Lewayne Bunting, MD sent at 09/17/2015  9:48 AM EST ----- Afib, rate increased; change toprol to 150 mg daily Olga Millers

## 2015-09-24 MED ORDER — METOPROLOL SUCCINATE ER 100 MG PO TB24: ORAL_TABLET | ORAL | Status: DC

## 2015-09-24 NOTE — Telephone Encounter (Signed)
Pt is calling you back 

## 2015-09-24 NOTE — Telephone Encounter (Signed)
Left message for pt to call.

## 2015-12-09 NOTE — Progress Notes (Signed)
HPI: FU atrial fibrillation. Patient had previously been followed by Dr. Lady Gary in [place]. The patient was noted to be in atrial fibrillation in December of 2012 prior to gastric bypass surgery. He was asymptomatic. Apparently was also noted previously to have a cardiomyopathy with an ejection fraction of 25-30%; also with moderate pulmonary hypertension. He has had previous cardioversions. Patient had repeat cardioversion on March 24 2013 to sinus rhythm. Echocardiogram August 2015 showed low normal LV function and mild left ventricular hypertrophy. Nuclear study August 2015 showed ejection fraction 51% and normal perfusion. Recurrent afib noted 11/16. Echocardiogram repeated November 2016 and showed ejection fraction 40-45%, mild mitral regurgitation, moderate left atrial enlargement. Holter monitor December 2016 showed atrial fibrillation with elevated rate. Toprol was increased. Plan has been rate control and anticoagulation as he has developed recurrent atrial fibrillation following cardioversions in the past and has been asymptomatic. Since last seen, Patient denies dyspnea, chest pain, palpitations or syncope. No bleeding.  Current Outpatient Prescriptions  Medication Sig Dispense Refill  . apixaban (ELIQUIS) 5 MG TABS tablet Take 1 tablet (5 mg total) by mouth 2 (two) times daily. 180 tablet 1  . Cholecalciferol (VITAMIN D3) 1000 UNITS CAPS Take 1 capsule by mouth daily.      [name] lisinopril (PRINIVIL,ZESTRIL) 20 MG tablet Take 1 tablet (20 mg total) by mouth daily. 90 tablet 3  . metoprolol succinate (TOPROL-XL) 100 MG 24 hr tablet One and one half tablets once daily Take with or immediately following a meal. 135 tablet 3  . omeprazole (PRILOSEC OTC) 20 MG tablet Take 20 mg by mouth daily.     No current facility-administered medications for this visit.     Past Medical History  Diagnosis Date  . Hypertension   . Obesity   . Hypogonadism male   . Atrial fibrillation (HCC)   .  Diabetes (HCC)     Previously on glucophage; now on no meds  . ED (erectile dysfunction)     Past Surgical History  Procedure Laterality Date  . Appendectomy  1981  . Laparoscopic gastric sleeve resection  8/13  . Eye surgery  in 1st grade    Left eye  . Cardioversion N/A 03/24/2013    Procedure: CARDIOVERSION;  Surgeon: Lewayne Bunting, MD;  Location: [facility] ENDOSCOPY;  Service: Cardiovascular;  Laterality: N/A;    Social History   Social History  . Marital Status: Married    Spouse Name: [name]  . Number of Children: N  . Years of Education: N/A   Occupational History  . 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  [employer]   Social History Main Topics  . Smoking status: Never Smoker   . Smokeless tobacco: Not on file  . Alcohol Use: No  . Drug Use: No  . Sexual Activity: Yes   Other Topics Concern  . Not on file   Social History Narrative   Got married 08/2010      Regular Exercise- yes          Family History  Problem Relation Age of Onset  . Heart attack Father 14  . Heart disease Father 27  . Heart attack Other     cousin  . Heart disease Other     ROS: no fevers or chills, productive cough, hemoptysis, dysphasia, odynophagia, melena, hematochezia, dysuria, hematuria, rash, seizure activity, orthopnea, PND, pedal edema, claudication. Remaining systems are negative.  Physical Exam: Well-developed obese in no acute distress.  Skin is warm and dry.  HEENT is normal.  Neck is supple.  Chest is clear to auscultation with normal expansion.  Cardiovascular exam is irregular Abdominal exam nontender or distended. No masses palpated. Extremities show no edema. neuro grossly intact  ECG Atrial fibrillation at a rate of 94.Nonspecific ST changes.

## 2015-12-17 ENCOUNTER — Ambulatory Visit (INDEPENDENT_AMBULATORY_CARE_PROVIDER_SITE_OTHER): Payer: Managed Care, Other (non HMO) | Admitting: Cardiology

## 2015-12-17 ENCOUNTER — Encounter: Payer: Self-pay | Admitting: Cardiology

## 2015-12-17 VITALS — BP 142/98 | HR 94 | Ht 70.0 in | Wt 337.0 lb

## 2015-12-17 DIAGNOSIS — I480 Paroxysmal atrial fibrillation: Secondary | ICD-10-CM | POA: Diagnosis not present

## 2015-12-17 DIAGNOSIS — I420 Dilated cardiomyopathy: Secondary | ICD-10-CM

## 2015-12-17 DIAGNOSIS — I4891 Unspecified atrial fibrillation: Secondary | ICD-10-CM

## 2015-12-17 MED ORDER — METOPROLOL SUCCINATE ER 200 MG PO TB24: 200.0000 mg | ORAL_TABLET | Freq: Every day | ORAL | Status: DC

## 2015-12-17 NOTE — Assessment & Plan Note (Signed)
Continue ACE inhibitor and beta blocker. His LV function may have been reduced in the past secondary to tachycardia. Increase Toprol to 200 mg daily. Repeat echocardiogram in 3 months.

## 2015-12-17 NOTE — Patient Instructions (Signed)
Medication Instructions:   INCREASE METOPROLOL SUCC ER TO 200 MG ONCE DAILY= 2 OF THE 100 MG TABLETS ONCE DAILY  Labwork:  Your physician recommends that you HAVE LAB WORK TODAY  Testing/Procedures:  Your physician has requested that you have an echocardiogram. Echocardiography is a painless test that uses sound waves to create images of your heart. It provides your doctor with information about the size and shape of your heart and how well your heart's chambers and valves are working. This procedure takes approximately one hour. There are no restrictions for this procedure.  SCHEDULE IN 3 MONTHS  Follow-Up:  Your physician recommends that you schedule a follow-up appointment in: 3 MONTHS WITH DR Jens Som AFTER ECHO

## 2015-12-17 NOTE — Assessment & Plan Note (Signed)
Patient counseled on weight loss. 

## 2015-12-17 NOTE — Assessment & Plan Note (Signed)
Patient remains in atrial fibrillation. Rate is high normal. Increase Toprol to 200 mg daily. Continue apixaban. Check hemoglobin and renal function. He is asymptomatic and I therefore favor rate control and anticoagulation as he is not held sinus with cardioversions previously.

## 2015-12-17 NOTE — Assessment & Plan Note (Signed)
Blood pressure elevated. Increase Toprol to 200 mg daily.

## 2015-12-21 LAB — BASIC METABOLIC PANEL
BUN: 18 mg/dL (ref 7–25)
CALCIUM: 8.7 mg/dL (ref 8.6–10.3)
CO2: 27 mmol/L (ref 20–31)
Chloride: 107 mmol/L (ref 98–110)
Creat: 1.23 mg/dL (ref 0.60–1.35)
Glucose, Bld: 106 mg/dL — ABNORMAL HIGH (ref 65–99)
POTASSIUM: 4.6 mmol/L (ref 3.5–5.3)
Sodium: 143 mmol/L (ref 135–146)

## 2015-12-21 LAB — CBC
HEMATOCRIT: 45.7 % (ref 39.0–52.0)
Hemoglobin: 15.8 g/dL (ref 13.0–17.0)
MCH: 30.9 pg (ref 26.0–34.0)
MCHC: 34.6 g/dL (ref 30.0–36.0)
MCV: 89.4 fL (ref 78.0–100.0)
MPV: 9.4 fL (ref 8.6–12.4)
Platelets: 226 10*3/uL (ref 150–400)
RBC: 5.11 MIL/uL (ref 4.22–5.81)
RDW: 14 % (ref 11.5–15.5)
WBC: 6.7 10*3/uL (ref 4.0–10.5)

## 2016-02-25 ENCOUNTER — Other Ambulatory Visit: Payer: Self-pay | Admitting: Pharmacist

## 2016-02-25 MED ORDER — APIXABAN 5 MG PO TABS: 5.0000 mg | ORAL_TABLET | Freq: Two times a day (BID) | ORAL | Status: DC

## 2016-03-18 ENCOUNTER — Other Ambulatory Visit: Payer: Self-pay

## 2016-03-18 ENCOUNTER — Ambulatory Visit (HOSPITAL_COMMUNITY): Payer: Managed Care, Other (non HMO) | Attending: Cardiovascular Disease

## 2016-03-18 DIAGNOSIS — E119 Type 2 diabetes mellitus without complications: Secondary | ICD-10-CM | POA: Insufficient documentation

## 2016-03-18 DIAGNOSIS — I119 Hypertensive heart disease without heart failure: Secondary | ICD-10-CM | POA: Insufficient documentation

## 2016-03-18 DIAGNOSIS — I4891 Unspecified atrial fibrillation: Secondary | ICD-10-CM | POA: Diagnosis not present

## 2016-03-18 DIAGNOSIS — I071 Rheumatic tricuspid insufficiency: Secondary | ICD-10-CM | POA: Insufficient documentation

## 2016-03-18 DIAGNOSIS — I340 Nonrheumatic mitral (valve) insufficiency: Secondary | ICD-10-CM | POA: Insufficient documentation

## 2016-03-18 DIAGNOSIS — I872 Venous insufficiency (chronic) (peripheral): Secondary | ICD-10-CM | POA: Insufficient documentation

## 2016-03-18 LAB — ECHOCARDIOGRAM COMPLETE
Ao-asc: 35 cm
FS: 29 % (ref 28–44)
IVS/LV PW RATIO, ED: 1.02
LA ID, A-P, ES: 61 mm
LA diam end sys: 61 mm
LA diam index: 2.15 cm/m2
LA vol A4C: 92.4 mL
LA vol index: 33.9 mL/m2
LA vol: 95.9 mL
LV PW d: 13.5 mm — AB (ref 0.6–1.1)
LVOT SV: 73 mL
LVOT VTI: 19.1 cm
LVOT area: 3.8 cm2
LVOT diameter: 22 mm
LVOT peak vel: 104 cm/s
Lateral S' vel: 10.7 cm/s
RV sys press: 34 mmHg
Reg peak vel: 257 cm/s
TAPSE: 22.4 mm
TR max vel: 257 cm/s

## 2016-03-23 NOTE — Progress Notes (Signed)
HPI: FU atrial fibrillation. Patient had previously been followed by Dr. Lady Gary in [place]. The patient was noted to be in atrial fibrillation in December of 2012 prior to gastric bypass surgery. He was asymptomatic. Apparently was also noted previously to have a cardiomyopathy with an ejection fraction of 25-30%; also with moderate pulmonary hypertension. He has had previous cardioversions. Patient had repeat cardioversion on March 24 2013 to sinus rhythm. Echocardiogram August 2015 showed low normal LV function and mild left ventricular hypertrophy. Nuclear study August 2015 showed ejection fraction 51% and normal perfusion. Recurrent afib noted 11/16. Echocardiogram repeated November 2016 and showed ejection fraction 40-45%, mild mitral regurgitation, moderate left atrial enlargement. Holter monitor December 2016 showed atrial fibrillation with elevated rate. Toprol was increased. Plan has been rate control and anticoagulation as he has developed recurrent atrial fibrillation following cardioversions in the past and has been asymptomatic. Last echocardiogram June 2017 showed normal LV function, severe left atrial enlargement, mild right ventricular enlargement. Since last seen, the patient has dyspnea with more extreme activities but not with routine activities. It is relieved with rest. It is not associated with chest pain. There is no orthopnea, PND. There is no syncope or palpitations. There is no exertional chest pain. Mild pedal edema   Current Outpatient Prescriptions  Medication Sig Dispense Refill  . apixaban (ELIQUIS) 5 MG TABS tablet Take 1 tablet (5 mg total) by mouth 2 (two) times daily. 180 tablet 1  . Cholecalciferol (VITAMIN D3) 1000 UNITS CAPS Take 1 capsule by mouth daily.      [name] lisinopril (PRINIVIL,ZESTRIL) 20 MG tablet Take 1 tablet (20 mg total) by mouth daily. 90 tablet 3  . metoprolol succinate (TOPROL-XL) 200 MG 24 hr tablet Take 1 tablet (200 mg total) by mouth daily.  Take with or immediately following a meal. 90 tablet 3  . omeprazole (PRILOSEC OTC) 20 MG tablet Take 20 mg by mouth daily.     No current facility-administered medications for this visit.     Past Medical History  Diagnosis Date  . Hypertension   . Obesity   . Hypogonadism male   . Atrial fibrillation (HCC)   . Diabetes (HCC)     Previously on glucophage; now on no meds  . ED (erectile dysfunction)     Past Surgical History  Procedure Laterality Date  . Appendectomy  1981  . Laparoscopic gastric sleeve resection  8/13  . Eye surgery  in 1st grade    Left eye  . Cardioversion N/A 03/24/2013    Procedure: CARDIOVERSION;  Surgeon: Lewayne Bunting, MD;  Location: [facility] ENDOSCOPY;  Service: Cardiovascular;  Laterality: N/A;    Social History   Social History  . Marital Status: Married    Spouse Name: [name]  . Number of Children: N  . Years of Education: N/A   Occupational History  . 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  [employer]   Social History Main Topics  . Smoking status: Never Smoker   . Smokeless tobacco: Not on file  . Alcohol Use: No  . Drug Use: No  . Sexual Activity: Yes   Other Topics Concern  . Not on file   Social History Narrative   Got married 08/2010      Regular Exercise- yes          Family History  Problem Relation Age of Onset  . Heart attack Father 80  . Heart disease Father 59  . Heart attack Other  cousin  . Heart disease Other     ROS: no fevers or chills, productive cough, hemoptysis, dysphasia, odynophagia, melena, hematochezia, dysuria, hematuria, rash, seizure activity, orthopnea, PND, claudication. Remaining systems are negative.  Physical Exam: Well-developed obese in no acute distress.  Skin is warm and dry.  HEENT is normal.  Neck is supple.  Chest is clear to auscultation with normal expansion.  Cardiovascular exam is irregular Abdominal exam nontender or distended. No masses palpated. Extremities show 1+  edema. neuro grossly intact

## 2016-03-31 ENCOUNTER — Encounter: Payer: Self-pay | Admitting: Cardiology

## 2016-03-31 ENCOUNTER — Ambulatory Visit (INDEPENDENT_AMBULATORY_CARE_PROVIDER_SITE_OTHER): Payer: Managed Care, Other (non HMO) | Admitting: Cardiology

## 2016-03-31 VITALS — BP 118/80 | HR 79 | Ht 70.0 in | Wt 343.0 lb

## 2016-03-31 DIAGNOSIS — I482 Chronic atrial fibrillation: Secondary | ICD-10-CM

## 2016-03-31 DIAGNOSIS — I420 Dilated cardiomyopathy: Secondary | ICD-10-CM

## 2016-03-31 DIAGNOSIS — I4821 Permanent atrial fibrillation: Secondary | ICD-10-CM

## 2016-03-31 DIAGNOSIS — G4733 Obstructive sleep apnea (adult) (pediatric): Secondary | ICD-10-CM | POA: Diagnosis not present

## 2016-03-31 NOTE — Assessment & Plan Note (Signed)
We discussed the importance of weight loss and exercise. 

## 2016-03-31 NOTE — Assessment & Plan Note (Signed)
I discussed the importance of using CPAP.

## 2016-03-31 NOTE — Assessment & Plan Note (Signed)
Blood pressure controlled. Continue present medications. 

## 2016-03-31 NOTE — Assessment & Plan Note (Signed)
Patient remains in atrial fibrillation. Our plan is rate control and anticoagulation. He is not symptomatic. Continue Toprol at present dose. Continue apixaban. Check hemoglobin and renal function when he returns in 6 months.

## 2016-03-31 NOTE — Patient Instructions (Signed)
Your physician wants you to follow-up in: 6 MONTHS WITH DR CRENSHAW You will receive a reminder letter in the mail two months in advance. If you don't receive a letter, please call our office to schedule the follow-up appointment.   If you need a refill on your cardiac medications before your next appointment, please call your pharmacy.  

## 2016-03-31 NOTE — Assessment & Plan Note (Signed)
Some edema likely secondary to obesity hypoventilation syndrome, obstructive sleep apnea. We discussed the importance of weight loss. I have asked him to keep his feet elevated.

## 2016-03-31 NOTE — Assessment & Plan Note (Signed)
LV function normalized on most recent echo. Continue ACE inhibitor and beta blocker. Previously reduced LV function felt secondary to tachycardia.

## 2016-07-28 ENCOUNTER — Other Ambulatory Visit: Payer: Self-pay | Admitting: Cardiology

## 2016-07-28 DIAGNOSIS — I480 Paroxysmal atrial fibrillation: Secondary | ICD-10-CM

## 2016-09-03 ENCOUNTER — Other Ambulatory Visit: Payer: Self-pay | Admitting: Cardiology

## 2016-12-19 ENCOUNTER — Other Ambulatory Visit: Payer: Self-pay | Admitting: Cardiology

## 2016-12-19 DIAGNOSIS — I4891 Unspecified atrial fibrillation: Secondary | ICD-10-CM

## 2016-12-19 DIAGNOSIS — I480 Paroxysmal atrial fibrillation: Secondary | ICD-10-CM

## 2016-12-21 NOTE — Telephone Encounter (Signed)
Rx(s) sent to pharmacy electronically.  

## 2017-01-27 ENCOUNTER — Other Ambulatory Visit: Payer: Self-pay | Admitting: Cardiology

## 2017-01-27 DIAGNOSIS — I4891 Unspecified atrial fibrillation: Secondary | ICD-10-CM

## 2017-01-27 DIAGNOSIS — I480 Paroxysmal atrial fibrillation: Secondary | ICD-10-CM

## 2017-01-29 ENCOUNTER — Other Ambulatory Visit: Payer: Self-pay | Admitting: Cardiology

## 2017-01-29 ENCOUNTER — Telehealth: Payer: Self-pay | Admitting: *Deleted

## 2017-01-29 DIAGNOSIS — I4891 Unspecified atrial fibrillation: Secondary | ICD-10-CM

## 2017-01-29 DIAGNOSIS — I480 Paroxysmal atrial fibrillation: Secondary | ICD-10-CM

## 2017-01-29 NOTE — Telephone Encounter (Signed)
REFILL 

## 2017-01-29 NOTE — Telephone Encounter (Signed)
Patient presented as a walk in today for a Metoprolol refill. It has already been refilled but the patient is due for a 6 month follow up with Dr. Jens Som. He has been sent to scheduling to make his appointment.

## 2017-02-01 ENCOUNTER — Other Ambulatory Visit: Payer: Self-pay | Admitting: Cardiology

## 2017-02-01 DIAGNOSIS — I480 Paroxysmal atrial fibrillation: Secondary | ICD-10-CM

## 2017-02-16 ENCOUNTER — Encounter: Payer: Self-pay | Admitting: Student

## 2017-02-16 ENCOUNTER — Ambulatory Visit (INDEPENDENT_AMBULATORY_CARE_PROVIDER_SITE_OTHER): Payer: BLUE CROSS/BLUE SHIELD | Admitting: Student

## 2017-02-16 VITALS — BP 118/90 | HR 79 | Ht 70.0 in | Wt 356.0 lb

## 2017-02-16 DIAGNOSIS — I420 Dilated cardiomyopathy: Secondary | ICD-10-CM | POA: Diagnosis not present

## 2017-02-16 DIAGNOSIS — Z7901 Long term (current) use of anticoagulants: Secondary | ICD-10-CM | POA: Diagnosis not present

## 2017-02-16 DIAGNOSIS — G4733 Obstructive sleep apnea (adult) (pediatric): Secondary | ICD-10-CM

## 2017-02-16 DIAGNOSIS — I4819 Other persistent atrial fibrillation: Secondary | ICD-10-CM

## 2017-02-16 DIAGNOSIS — I10 Essential (primary) hypertension: Secondary | ICD-10-CM

## 2017-02-16 DIAGNOSIS — I481 Persistent atrial fibrillation: Secondary | ICD-10-CM | POA: Diagnosis not present

## 2017-02-16 MED ORDER — LISINOPRIL 20 MG PO TABS: 20.0000 mg | ORAL_TABLET | Freq: Every day | ORAL | 3 refills | Status: DC

## 2017-02-16 MED ORDER — METOPROLOL SUCCINATE ER 200 MG PO TB24: 200.0000 mg | ORAL_TABLET | Freq: Every day | ORAL | 3 refills | Status: DC

## 2017-02-16 MED ORDER — APIXABAN 5 MG PO TABS: 5.0000 mg | ORAL_TABLET | Freq: Two times a day (BID) | ORAL | 3 refills | Status: DC

## 2017-02-16 NOTE — Patient Instructions (Signed)
Medication Instructions:  Your physician recommends that you continue on your current medications as directed. Please refer to the Current Medication list given to you today.  Labwork: NONE  Testing/Procedures: NONE  Follow-Up: Your physician wants you to follow-up in: 1 YEAR OV WITH DR Jens Som You will receive a reminder letter in the mail two months in advance. If you don't receive a letter, please call our office to schedule the follow-up appointment.  If you need a refill on your cardiac medications before your next appointment, please call your pharmacy.

## 2017-02-16 NOTE — Progress Notes (Signed)
Cardiology Office Note    Date:  02/16/2017   ID:  Dakota Hernandez, DOB June 20, 1966, MRN [id number]  PCP:  Tresa Garter, MD  Cardiologist: Dr. Jens Som   Chief Complaint  Patient presents with  . Follow-up    no chest pain, shortness of breath, pain or cramping in legs,lightheaded or dizziness; occassional edema with long periods of standing     History of Present Illness:    Dakota Hernandez is a 51 y.o. male with past medical history of PAF (s/p multiple DCCV's, now rate-control, on Eliquis), history of cardiomyopathy (EF previously 25-30%, improved to 55-60% by echo in 03/2016), normal NST in 05/2014, OSA (on CPAP), and HTN who presents to the office today for routine follow-up.   Was last examined by Dr. Jens Som in 03/2016 and reported having dyspnea with extreme activites but no routine activities. Denied any associated chest pain. He was continued on current medication regimen at that time.   In talking with the patient today, he reports doing well from a cardiac perspective. He denies any recent chest discomfort, dyspnea on exertion, palpitations, lightheadedness, dizziness, or presyncope.  He does have occasional lower extremity edema which occurs when he is standing at his job throughout the day and reports this improves overnight and is resolved in the morning. No associated orthopnea or PND.  He reports good compliance with his medication regimen including Eliquis 5 mg twice a day. He denies any melena, hematochezia, or hematuria.    Past Medical History:  Diagnosis Date  . Atrial fibrillation (HCC)    a. s/p multiple DCCV's ---> now rate-controlled. On Eliquis for anticoagulation  . Cardiomyopathy (HCC)    a. EF previously at 25-30% (Tachycardia-mediated?) --> at 55-60% by echo in 03/2016  . Diabetes (HCC)    Previously on glucophage; now on no meds  . ED (erectile dysfunction)   . Hypertension   . Hypogonadism male   . Obesity   . OSA (obstructive  sleep apnea)    a. on CPAP    Past Surgical History:  Procedure Laterality Date  . APPENDECTOMY  1981  . CARDIOVERSION N/A 03/24/2013   Procedure: CARDIOVERSION;  Surgeon: Lewayne Bunting, MD;  Location: [facility] ENDOSCOPY;  Service: Cardiovascular;  Laterality: N/A;  . EYE SURGERY  in 1st grade   Left eye  . LAPAROSCOPIC GASTRIC SLEEVE RESECTION  8/13    Current Medications: Outpatient Medications Prior to Visit  Medication Sig Dispense Refill  . Cholecalciferol (VITAMIN D3) 1000 UNITS CAPS Take 1 capsule by mouth daily.      [name] omeprazole (PRILOSEC OTC) 20 MG tablet Take 20 mg by mouth daily.    [name] ELIQUIS 5 MG TABS tablet take 1 tablet by mouth twice a day 180 tablet 1  . lisinopril (PRINIVIL,ZESTRIL) 20 MG tablet take 1 tablet by mouth once daily 30 tablet 1  . metoprolol (TOPROL-XL) 200 MG 24 hr tablet Take 1 tablet (200 mg total) by mouth daily. NEED OV. 30 tablet 0   No facility-administered medications prior to visit.      Allergies:   Hydrochlorothiazide   Social History   Social History  . Marital status: Married    Spouse name: [name]  . Number of children: N  . Years of education: N/A   Occupational History  . 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  [employer]   Social History Main Topics  . Smoking status: Never Smoker  . Smokeless tobacco: Never Used  . Alcohol use No  .  Drug use: No  . Sexual activity: Yes   Other Topics Concern  . None   Social History Narrative   Got married 08/2010      Regular Exercise- yes           Family History:  The patient's family history includes Heart attack in his other; Heart attack (age of onset: 49) in his father; Heart disease in his other; Heart disease (age of onset: 34) in his father.   Review of Systems:   Please see the history of present illness.     General:  No chills, fever, night sweats or weight changes.  Cardiovascular:  No chest pain, dyspnea on exertion, orthopnea, palpitations, paroxysmal nocturnal  dyspnea. Positive for occasional lower extremity edema.  Dermatological: No rash, lesions/masses Respiratory: No cough, dyspnea Urologic: No hematuria, dysuria Abdominal:   No nausea, vomiting, diarrhea, bright red blood per rectum, melena, or hematemesis Neurologic:  No visual changes, wkns, changes in mental status. All other systems reviewed and are otherwise negative except as noted above.   Physical Exam:    VS:  BP 118/90   Pulse 79   Ht 5\' 10"  (1.778 m)   Wt (!) 356 lb (161.5 kg)   BMI 51.08 kg/m    General: Well developed, morbidly obese Caucasian male appearing in no acute distress. Head: Normocephalic, atraumatic, sclera non-icteric, no xanthomas, nares are without discharge.  Neck: No carotid bruits. JVD is difficult to assess secondary to body habitus. Lungs: Respirations regular and unlabored, without wheezes or rales.  Heart: Irregularly irregular. No S3 or S4.  No murmur, no rubs, or gallops appreciated. Abdomen: Soft, non-tender, non-distended with normoactive bowel sounds. No hepatomegaly. No rebound/guarding. No obvious abdominal masses. Msk:  Strength and tone appear normal for age. No joint deformities or effusions. Extremities: No clubbing or cyanosis. Trace edema along his ankles bilaterally.  Distal pedal pulses are 2+ bilaterally. Neuro: Alert and oriented X 3. Moves all extremities spontaneously. No focal deficits noted. Psych:  Responds to questions appropriately with a normal affect. Skin: No rashes or lesions noted  Wt Readings from Last 3 Encounters:  02/16/17 (!) 356 lb (161.5 kg)  03/31/16 (!) 343 lb (155.6 kg)  12/17/15 (!) 337 lb (152.9 kg)    Studies/Labs Reviewed:   EKG:  EKG is ordered today.  The ekg ordered today demonstrates rate-controlled atrial fibrillation, HR 79.  Recent Labs: No results found for requested labs within last 8760 hours.   Lipid Panel    Component Value Date/Time   CHOL 137 06/14/2008 1020   TRIG 69 06/14/2008  1020   HDL 34.5 (L) 06/14/2008 1020   CHOLHDL 4.0 CALC 06/14/2008 1020   VLDL 14 06/14/2008 1020   LDLCALC 89 06/14/2008 1020    Additional studies/ records that were reviewed today include:   Echocardiogram: 03/2016 Study Conclusions  - Left ventricle: The cavity size was mildly dilated. There was   moderate concentric hypertrophy. Systolic function was normal.   The estimated ejection fraction was in the range of 55% to 60%.   Wall motion was normal; there were no regional wall motion   abnormalities. - Mitral valve: There was trivial regurgitation. - Left atrium: The atrium was severely dilated. - Right ventricle: The cavity size was mildly dilated. Wall   thickness was normal. Systolic function was normal. - Tricuspid valve: There was mild regurgitation. - Pulmonary arteries: Systolic pressure was within the normal   range. - Inferior vena cava: The vessel was normal in  size. The   respirophasic diameter changes were in the normal range (>= 50%),   consistent with normal central venous pressure.  Assessment:    1. Persistent atrial fibrillation (HCC)   2. Chronic anticoagulation   3. Congestive dilated cardiomyopathy (HCC)   4. OSA (obstructive sleep apnea)   5. Essential hypertension   6. Morbid obesity, unspecified obesity type (HCC)      Plan:   In order of problems listed above:  1. Persistent Atrial Fibrillation/ Chronic Anticoagulation  - s/p multiple DCCV's, now rate-controlled. - He denies any recent palpitations, chest discomfort, dyspnea on exertion, lightheadedness, dizziness, or presyncope. - This patients CHA2DS2-VASc Score and unadjusted Ischemic Stroke Rate (% per year) is equal to 2.2 % stroke rate/year from a score of 2 (HTN, prior CHF). He denies any evidence of active bleeding. Continue Eliquis for anticoagulation. - HR remains well-controlled. Continue Toprol-XL 200mg  daily.   2. Prior Cardiomyopathy - EF previously 25-30% by prior reports,  improved to 55-60% by echo in 03/2016.  - He does not appear volume overloaded by physical examination. - continue Toprol-XL and Lisinopril.   3. OSA - continue CPAP.   4. HTN - BP is well-controlled at 118/90 during his visit. - continue Toprol-XL 200mg  daily and Lisinopril 20mg  daily.   5. Morbid Obesity - BMI at 51.08. - continued diet and exercise encouraged.    Medication Adjustments/Labs and Tests Ordered: Current medicines are reviewed at length with the patient today.  Concerns regarding medicines are outlined above.  Medication changes, Labs and Tests ordered today are listed in the Patient Instructions below. Patient Instructions  Medication Instructions:  Your physician recommends that you continue on your current medications as directed. Please refer to the Current Medication list given to you today.  Labwork: NONE  Testing/Procedures: NONE  Follow-Up: Your physician wants you to follow-up in: 1 YEAR OV WITH DR Jens Som You will receive a reminder letter in the mail two months in advance. If you don't receive a letter, please call our office to schedule the follow-up appointment.  If you need a refill on your cardiac medications before your next appointment, please call your pharmacy.    Signed, Ellsworth Lennox, PA-C  02/16/2017 4:33 PM    [facility] [address] [place], [address] [place], [place]  [address] Phone: [phone]; Fax: [phone]  [address], [address] [place], [place] [address] Phone: [phone]

## 2017-02-19 ENCOUNTER — Other Ambulatory Visit: Payer: Self-pay

## 2017-02-19 DIAGNOSIS — G4733 Obstructive sleep apnea (adult) (pediatric): Secondary | ICD-10-CM

## 2017-02-19 DIAGNOSIS — I4819 Other persistent atrial fibrillation: Secondary | ICD-10-CM

## 2017-02-19 MED ORDER — LISINOPRIL 20 MG PO TABS: 20.0000 mg | ORAL_TABLET | Freq: Every day | ORAL | 3 refills | Status: DC

## 2017-02-19 MED ORDER — METOPROLOL SUCCINATE ER 200 MG PO TB24: 200.0000 mg | ORAL_TABLET | Freq: Every day | ORAL | 3 refills | Status: DC

## 2018-01-06 ENCOUNTER — Other Ambulatory Visit: Payer: Self-pay | Admitting: Student

## 2018-01-06 DIAGNOSIS — I4819 Other persistent atrial fibrillation: Secondary | ICD-10-CM

## 2018-01-06 DIAGNOSIS — G4733 Obstructive sleep apnea (adult) (pediatric): Secondary | ICD-10-CM

## 2018-01-07 NOTE — Telephone Encounter (Signed)
Rx has been sent to the pharmacy electronically. ° °

## 2018-03-03 ENCOUNTER — Other Ambulatory Visit: Payer: Self-pay | Admitting: Student

## 2018-04-01 ENCOUNTER — Encounter: Payer: Self-pay | Admitting: Cardiology

## 2018-04-03 ENCOUNTER — Other Ambulatory Visit: Payer: Self-pay | Admitting: Cardiology

## 2018-04-04 NOTE — Progress Notes (Signed)
HPI: FU atrial fibrillation. Patient had previously been followed by Dr. Lady Gary in [place]. The patient was noted to be in atrial fibrillation in December of 2012 prior to gastric bypass surgery. He was asymptomatic. Apparently was also noted previously to have a cardiomyopathy with an ejection fraction of 25-30%; also with moderate pulmonary hypertension. He has had previous cardioversions. Patient had repeat cardioversion on March 24 2013 to sinus rhythm. Echocardiogram August 2015 showed low normal LV function and mild left ventricular hypertrophy. Nuclear study August 2015 showed ejection fraction 51% and normal perfusion. Recurrent afib noted 11/16. Echocardiogram repeated November 2016 and showed ejection fraction 40-45%, mild mitral regurgitation, moderate left atrial enlargement. Holter monitor December 2016 showed atrial fibrillation with elevated rate. Toprol was increased. Plan has been rate control and anticoagulation as he has developed recurrent atrial fibrillation following cardioversions in the past and has been asymptomatic. Last echocardiogram June 2017 showed normal LV function, severe left atrial enlargement, mild right ventricular enlargement. Since last seen, patient denies dyspnea, chest pain, palpitations or syncope.  Current Outpatient Medications  Medication Sig Dispense Refill  . Cholecalciferol (VITAMIN D3) 1000 UNITS CAPS Take 1 capsule by mouth daily.      [name] ELIQUIS 5 MG TABS tablet TAKE 1 TABLET BY MOUTH TWICE DAILY 60 tablet 0  . lisinopril (PRINIVIL,ZESTRIL) 20 MG tablet Take 1 tablet (20 mg total) by mouth daily. 90 tablet 3  . metoprolol (TOPROL-XL) 200 MG 24 hr tablet Take 1 tablet (200 mg total) by mouth daily. 90 tablet 3  . omeprazole (PRILOSEC OTC) 20 MG tablet Take 20 mg by mouth daily.     No current facility-administered medications for this visit.      Past Medical History:  Diagnosis Date  . Atrial fibrillation (HCC)    a. s/p multiple DCCV's  ---> now rate-controlled. On Eliquis for anticoagulation  . Cardiomyopathy (HCC)    a. EF previously at 25-30% (Tachycardia-mediated?) --> at 55-60% by echo in 03/2016  . Diabetes (HCC)    Previously on glucophage; now on no meds  . ED (erectile dysfunction)   . Hypertension   . Hypogonadism male   . Obesity   . OSA (obstructive sleep apnea)    a. on CPAP    Past Surgical History:  Procedure Laterality Date  . APPENDECTOMY  1981  . CARDIOVERSION N/A 03/24/2013   Procedure: CARDIOVERSION;  Surgeon: Lewayne Bunting, MD;  Location: [facility] ENDOSCOPY;  Service: Cardiovascular;  Laterality: N/A;  . EYE SURGERY  in 1st grade   Left eye  . LAPAROSCOPIC GASTRIC SLEEVE RESECTION  8/13    Social History   Socioeconomic History  . Marital status: Married    Spouse name: [name]  . Number of children: N  . Years of education: Not on file  . Highest education level: Not on file  Occupational History  . Occupation: Customer service manager: [employer]    Employer: [employer]  Social Needs  . Financial resource strain: Not on file  . Food insecurity:    Worry: Not on file    Inability: Not on file  . Transportation needs:    Medical: Not on file    Non-medical: Not on file  Tobacco Use  . Smoking status: Never Smoker  . Smokeless tobacco: Never Used  Substance and Sexual Activity  . Alcohol use: No  . Drug use: No  . Sexual activity: Yes  Lifestyle  . Physical activity:    Days per  week: Not on file    Minutes per session: Not on file  . Stress: Not on file  Relationships  . Social connections:    Talks on phone: Not on file    Gets together: Not on file    Attends religious service: Not on file    Active member of club or organization: Not on file    Attends meetings of clubs or organizations: Not on file    Relationship status: Not on file  . Intimate partner violence:    Fear of current or ex partner: Not on file    Emotionally abused: Not on file    Physically  abused: Not on file    Forced sexual activity: Not on file  Other Topics Concern  . Not on file  Social History Narrative   Got married 08/2010      Regular Exercise- yes          Family History  Problem Relation Age of Onset  . Heart attack Father 58  . Heart disease Father 59  . Heart attack Other        cousin  . Heart disease Other     ROS: no fevers or chills, productive cough, hemoptysis, dysphasia, odynophagia, melena, hematochezia, dysuria, hematuria, rash, seizure activity, orthopnea, PND, pedal edema, claudication. Remaining systems are negative.  Physical Exam: Well-developed obese in no acute distress.  Skin is warm and dry.  HEENT is normal.  Neck is supple.  Chest is clear to auscultation with normal expansion.  Cardiovascular exam is irregular Abdominal exam nontender or distended. No masses palpated. Extremities show no edema. neuro grossly intact  ECG-atrial fibrillation at a rate of 95.  Nonspecific ST changes.  Personally reviewed  A/P  1 permanent atrial fibrillation-plan to continue Toprol for rate control.  Rate is high normal.  Blood pressure is elevated.  Add Cardizem CD 120 mg daily.  Continue apixaban.  Check hemoglobin and renal function.  2 cardiomyopathy-LV function improved on most recent echocardiogram.  Continue ACE inhibitor and beta-blocker.  Previous cardiomyopathy was felt secondary to tachycardia.  3 hypertension-blood pressure is elevated.  Add Cardizem both for high normal heart rate and blood pressure.  4 obstructive sleep apnea-we discussed the importance of continuing CPAP.  5 morbid obesity-we discussed the importance of diet, exercise and weight loss.  Olga Millers, MD

## 2018-04-11 ENCOUNTER — Encounter: Payer: Self-pay | Admitting: Cardiology

## 2018-04-11 ENCOUNTER — Ambulatory Visit (INDEPENDENT_AMBULATORY_CARE_PROVIDER_SITE_OTHER): Payer: BLUE CROSS/BLUE SHIELD | Admitting: Cardiology

## 2018-04-11 VITALS — BP 136/100 | HR 95 | Ht 70.0 in | Wt 340.0 lb

## 2018-04-11 DIAGNOSIS — I4891 Unspecified atrial fibrillation: Secondary | ICD-10-CM | POA: Diagnosis not present

## 2018-04-11 DIAGNOSIS — Z7901 Long term (current) use of anticoagulants: Secondary | ICD-10-CM

## 2018-04-11 DIAGNOSIS — I420 Dilated cardiomyopathy: Secondary | ICD-10-CM

## 2018-04-11 DIAGNOSIS — I10 Essential (primary) hypertension: Secondary | ICD-10-CM

## 2018-04-11 MED ORDER — DILTIAZEM HCL ER COATED BEADS 120 MG PO CP24: 120.0000 mg | ORAL_CAPSULE | Freq: Every day | ORAL | 3 refills | Status: DC

## 2018-04-11 NOTE — Patient Instructions (Signed)
Medication Instructions:  Start diltiazem (Cardizem CD) 120 mg daily  Labwork: Today (BMET, CBC)  Follow-Up: Your physician wants you to follow-up in: 1 year with Dr. Jens Som. You will receive a reminder letter in the mail two months in advance. If you don't receive a letter, please call our office to schedule the follow-up appointment.   Any Other Special Instructions Will Be Listed Below (If Applicable).     If you need a refill on your cardiac medications before your next appointment, please call your pharmacy.

## 2018-04-12 ENCOUNTER — Encounter: Payer: Self-pay | Admitting: *Deleted

## 2018-04-12 LAB — CBC
HEMOGLOBIN: 14.8 g/dL (ref 13.0–17.7)
Hematocrit: 43.4 % (ref 37.5–51.0)
MCH: 30 pg (ref 26.6–33.0)
MCHC: 34.1 g/dL (ref 31.5–35.7)
MCV: 88 fL (ref 79–97)
PLATELETS: 178 10*3/uL (ref 150–450)
RBC: 4.93 x10E6/uL (ref 4.14–5.80)
RDW: 14.6 % (ref 12.3–15.4)
WBC: 5.2 10*3/uL (ref 3.4–10.8)

## 2018-04-12 LAB — BASIC METABOLIC PANEL
BUN/Creatinine Ratio: 14 (ref 9–20)
BUN: 14 mg/dL (ref 6–24)
CALCIUM: 8.7 mg/dL (ref 8.7–10.2)
CHLORIDE: 107 mmol/L — AB (ref 96–106)
CO2: 24 mmol/L (ref 20–29)
Creatinine, Ser: 1.01 mg/dL (ref 0.76–1.27)
GFR calc non Af Amer: 86 mL/min/{1.73_m2} (ref >59)
GFR, EST AFRICAN AMERICAN: 100 mL/min/{1.73_m2} (ref >59)
GLUCOSE: 83 mg/dL (ref 65–99)
POTASSIUM: 4.3 mmol/L (ref 3.5–5.2)
Sodium: 145 mmol/L — ABNORMAL HIGH (ref 134–144)

## 2018-05-06 ENCOUNTER — Other Ambulatory Visit: Payer: Self-pay | Admitting: Cardiology

## 2018-06-26 ENCOUNTER — Other Ambulatory Visit: Payer: Self-pay | Admitting: Cardiology

## 2018-06-26 DIAGNOSIS — G4733 Obstructive sleep apnea (adult) (pediatric): Secondary | ICD-10-CM

## 2018-06-26 DIAGNOSIS — I4819 Other persistent atrial fibrillation: Secondary | ICD-10-CM

## 2018-09-13 ENCOUNTER — Telehealth: Payer: Self-pay | Admitting: Cardiology

## 2018-09-13 NOTE — Telephone Encounter (Signed)
Spoke with pt. Who sts that he is currently at the dentist and is wanting an answer to the message below.adv the pt that unfortunately I can give him an immediate answer.  Adv pt that a message has been previously routed to our pre-op pool, I will also route the message to the Pharmacist to advise.

## 2018-09-13 NOTE — Telephone Encounter (Signed)
1. What dental office are you calling from? [facility]  2. What is your office phone number? [phone]  3. What is your fax number?[phone]  4. What type of procedure is the patient having performed? extraction   5. What date is procedure scheduled or is the patient there now? Soon as possible.   6. What is your question (ex. Antibiotics prior to procedure, holding medication-we need to know how long dentist wants pt to hold med)? What ever the dr. Denny Levy

## 2018-09-13 NOTE — Telephone Encounter (Signed)
   Primary Cardiologist: Olga Millers, MD  Chart reviewed as part of pre-operative protocol coverage. Patient was contacted 09/13/2018. He says that he needs 1 tooth removed. He denies any hx of valve replacement, congenital heart disease or endocarditis.  Per our pharmacy protocol: Patient with diagnosis of atrial fibrillation on Eliquis for anticoagulation.    Procedure: dental extraction Date of procedure: TBD  CHADS2-VASc score of  3 (CHF, HTN, , DM2, )  Our protocol recommends to NOT hold anticoagulation for extraction of 1 or 2 teeth.    Patient has no indication of valve replacement, infective endocarditis or congenital heart disease therefore no need for dental prophylaxis.  I will route this recommendation to the requesting party via Epic fax function and remove from pre-op pool.  Please call with questions.  Berton Bon, NP 09/13/2018, 4:54 PM

## 2018-09-13 NOTE — Telephone Encounter (Signed)
Follow up   Patient wants to have clearance approved today so that he can get extraction done asap

## 2018-09-13 NOTE — Telephone Encounter (Signed)
Patient with diagnosis of atrial fibrillation on Eliquis for anticoagulation.    Procedure: dental extraction Date of procedure: TBD  CHADS2-VASc score of  3 (CHF, HTN, , DM2, )  Our protocol recommends to NOT hold anticoagulation for extraction of 1 or 2 teeth.    Patient has no indication of valve replacement, infective endocarditis or congenital heart disease therefore no need for dental prophylaxis.

## 2018-11-05 ENCOUNTER — Other Ambulatory Visit: Payer: Self-pay | Admitting: Cardiology

## 2018-12-14 ENCOUNTER — Telehealth: Payer: Self-pay | Admitting: *Deleted

## 2018-12-14 NOTE — Telephone Encounter (Signed)
Patient came in requesting samples of Eliquis 5 mg secondary to insurance not wanting to cover locally, only mail order which is more expensive since he has copay card. Eliquis 5 mg #2  Lot # [id number] exp 6/22 given to patient and advised per [facility] he should be able to use copay card with mail order. Patient will call and try however he has started appeals process.

## 2019-01-24 ENCOUNTER — Telehealth: Payer: Self-pay

## 2019-01-24 MED ORDER — APIXABAN 5 MG PO TABS: 5.0000 mg | ORAL_TABLET | Freq: Two times a day (BID) | ORAL | 2 refills | Status: DC

## 2019-01-24 NOTE — Telephone Encounter (Signed)
Rx sent 

## 2019-02-28 ENCOUNTER — Telehealth: Payer: Self-pay | Admitting: Cardiology

## 2019-02-28 MED ORDER — APIXABAN 5 MG PO TABS: 5.0000 mg | ORAL_TABLET | Freq: Two times a day (BID) | ORAL | 2 refills | Status: DC

## 2019-02-28 NOTE — Telephone Encounter (Signed)
New message   Pt c/o medication issue:  1. Name of Medication:Eliquis 5 mg   2. How are you currently taking this medication (dosage and times per day)? Twice daily  3. Are you having a reaction (difficulty breathing--STAT)? No   4. What is your medication issue? Patient states that his insurance company will not pay for this medication. He wants to see what medication is equivalent to this medication. Please advise.

## 2019-02-28 NOTE — Telephone Encounter (Signed)
Spoke with pt who state he use to get his Eliquis at [facility] and was allowed to use the $10 co-pay card. He report this year, his insurance is requiring him to having medication filled through a mail service for a 90 day supply at the cost of over $1000. Pt state he can't afford to pay that much and would like for Dr. Jens Som to recommend something more cost efficient that's equivalen to Eliquis.    Will route to MD

## 2019-02-28 NOTE — Telephone Encounter (Signed)
Spoke with pt, will send a month supply to local pharmacy so he can use the co-pay card. Will mail the patient assistance paperwork to the patient and he will check on xarelto.

## 2019-02-28 NOTE — Telephone Encounter (Signed)
Please check with patient assistance forms or what cost of Xarelto may be Olga Millers

## 2019-04-03 ENCOUNTER — Telehealth: Payer: Self-pay | Admitting: *Deleted

## 2019-04-03 NOTE — Telephone Encounter (Signed)
Unable to leave a message, no voicemail.  

## 2019-05-01 ENCOUNTER — Other Ambulatory Visit: Payer: Self-pay | Admitting: Cardiology

## 2019-05-16 ENCOUNTER — Telehealth: Payer: Self-pay | Admitting: *Deleted

## 2019-05-16 NOTE — Telephone Encounter (Signed)
Patient has no voicemail, unable to leave a message.

## 2019-05-22 ENCOUNTER — Other Ambulatory Visit: Payer: Self-pay

## 2019-06-01 ENCOUNTER — Other Ambulatory Visit: Payer: Self-pay | Admitting: Cardiology

## 2019-06-19 ENCOUNTER — Other Ambulatory Visit: Payer: Self-pay

## 2019-06-19 DIAGNOSIS — I4819 Other persistent atrial fibrillation: Secondary | ICD-10-CM

## 2019-06-19 DIAGNOSIS — G4733 Obstructive sleep apnea (adult) (pediatric): Secondary | ICD-10-CM

## 2019-06-20 ENCOUNTER — Other Ambulatory Visit: Payer: Self-pay

## 2019-06-20 DIAGNOSIS — I4819 Other persistent atrial fibrillation: Secondary | ICD-10-CM

## 2019-06-20 DIAGNOSIS — G4733 Obstructive sleep apnea (adult) (pediatric): Secondary | ICD-10-CM

## 2019-06-20 MED ORDER — LISINOPRIL 20 MG PO TABS: 20.0000 mg | ORAL_TABLET | Freq: Every day | ORAL | 0 refills | Status: DC

## 2019-06-20 MED ORDER — METOPROLOL SUCCINATE ER 200 MG PO TB24: 200.0000 mg | ORAL_TABLET | Freq: Every day | ORAL | 0 refills | Status: DC

## 2019-08-10 ENCOUNTER — Other Ambulatory Visit: Payer: Self-pay | Admitting: Cardiology

## 2019-08-10 DIAGNOSIS — I4819 Other persistent atrial fibrillation: Secondary | ICD-10-CM

## 2019-08-10 DIAGNOSIS — G4733 Obstructive sleep apnea (adult) (pediatric): Secondary | ICD-10-CM

## 2020-01-26 NOTE — Progress Notes (Signed)
HPI: FU atrial fibrillation.  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  He has had previous cardioversions but did not hold sinus rhythm.  Pt is now treated with rate control and anticoagulation as he is asymptomatic. Last echocardiogram June 2017 showed normal LV function, severe left atrial enlargement, mild right ventricular enlargement. Since last seen,  he denies dyspnea, chest pain, palpitations or syncope.  Note he is not taking his apixaban or his diltiazem.  Current Outpatient Medications  Medication Sig Dispense Refill  . Cholecalciferol (VITAMIN D3) 1000 UNITS CAPS Take 1 capsule by mouth daily.      [name] diltiazem (CARDIZEM CD) 120 MG 24 hr capsule Take 1 capsule (120 mg total) by mouth daily. NEEDS APPOINTMENT FOR MORE REFILLS 90 capsule 0  . lisinopril (ZESTRIL) 20 MG tablet TAKE 1 TABLET BY MOUTH  DAILY 90 tablet 3  . metoprolol (TOPROL-XL) 200 MG 24 hr tablet TAKE 1 TABLET BY MOUTH  DAILY 90 tablet 3  . omeprazole (PRILOSEC OTC) 20 MG tablet Take 20 mg by mouth daily.     No current facility-administered medications for this visit.     Past Medical History:  Diagnosis Date  . Atrial fibrillation (HCC)    a. s/p multiple DCCV's ---> now rate-controlled. On Eliquis for anticoagulation  . Cardiomyopathy (HCC)    a. EF previously at 25-30% (Tachycardia-mediated?) --> at 55-60% by echo in 03/2016  . Diabetes (HCC)    Previously on glucophage; now on no meds  . ED (erectile dysfunction)   . Hypertension   . Hypogonadism male   . Obesity   . OSA (obstructive sleep apnea)    a. on CPAP    Past Surgical History:  Procedure Laterality Date  . APPENDECTOMY  1981  . CARDIOVERSION N/A 03/24/2013   Procedure: CARDIOVERSION;  Surgeon: Lewayne Bunting, MD;  Location: [facility] ENDOSCOPY;  Service: Cardiovascular;  Laterality: N/A;  . EYE SURGERY  in 1st grade   Left eye  . LAPAROSCOPIC GASTRIC SLEEVE RESECTION   8/13    Social History   Socioeconomic History  . Marital status: Married    Spouse name: [name]  . Number of children: N  . Years of education: Not on file  . Highest education level: Not on file  Occupational History  . 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 Smoking status: Never Smoker  . Smokeless tobacco: Never Used  Substance and Sexual Activity  . Alcohol use: No  . Drug use: No  . Sexual activity: Yes  Other Topics Concern  . Not on file  Social History Narrative   Got married 08/2010      Regular Exercise- yes         Social Determinants of Health   Financial Resource Strain:   . Difficulty of Paying Living Expenses:   Food Insecurity:   . Worried About Programme researcher, broadcasting/film/video in the Last Year:   . Barista in the Last Year:   Transportation Needs:   . Freight forwarder (Medical):   [name] Lack of Transportation (Non-Medical):   Physical Activity:   . Days of Exercise per Week:   . Minutes of Exercise per Session:   Stress:   . Feeling of Stress :   Social Connections:   . Frequency of Communication with Friends and Family:   . Frequency of Social  Gatherings with Friends and Family:   . Attends Religious Services:   . Active Member of Clubs or Organizations:   . Attends Archivist Meetings:   [name] Marital Status:   Intimate Partner Violence:   . Fear of Current or Ex-Partner:   . Emotionally Abused:   [name] Physically Abused:   . Sexually Abused:     Family History  Problem Relation Age of Onset  . Heart attack Father 31  . Heart disease Father 76  . Heart attack Other        cousin  . Heart disease Other     ROS: no fevers or chills, productive cough, hemoptysis, dysphasia, odynophagia, melena, hematochezia, dysuria, hematuria, rash, seizure activity, orthopnea, PND, pedal edema, claudication. Remaining systems are negative.  Physical Exam: Well-developed morbidly obese in no  acute distress.  Skin is warm and dry.  HEENT is normal.  Neck is supple.  Chest is clear to auscultation with normal expansion.  Cardiovascular exam is irregular Abdominal exam nontender or distended. No masses palpated. Extremities show trace edema. neuro grossly intact  ECG-atrial fibrillation at a rate of 93, nonspecific ST changes.  Personally reviewed  A/P  1 permanent atrial fibrillation-continue Toprol and resume Cardizem for rate control.  Resume apixaban.  2 cardiomyopathy-LV function improved on most recent echocardiogram.  We will repeat study.  Previous reduction in LV function felt secondary to tachycardia.  Continue ACE inhibitor and beta-blocker.  3 hypertension-blood pressure borderline but he is not taking his diltiazem.  We will resume and follow.  Adjust regimen as needed.  4 obstructive sleep apnea-not using CPAP.  I encouraged compliance.  We will arrange follow-up with pulmonary to manage.  5 morbid obesity-we discussed the importance of diet, exercise and weight loss.  Kirk Ruths, MD

## 2020-02-01 ENCOUNTER — Ambulatory Visit (INDEPENDENT_AMBULATORY_CARE_PROVIDER_SITE_OTHER): Payer: BC Managed Care – PPO | Admitting: Cardiology

## 2020-02-01 ENCOUNTER — Other Ambulatory Visit: Payer: Self-pay

## 2020-02-01 ENCOUNTER — Encounter: Payer: Self-pay | Admitting: Cardiology

## 2020-02-01 VITALS — BP 130/90 | HR 93 | Ht 70.0 in | Wt 365.0 lb

## 2020-02-01 DIAGNOSIS — I10 Essential (primary) hypertension: Secondary | ICD-10-CM | POA: Diagnosis not present

## 2020-02-01 DIAGNOSIS — I420 Dilated cardiomyopathy: Secondary | ICD-10-CM | POA: Diagnosis not present

## 2020-02-01 DIAGNOSIS — I4891 Unspecified atrial fibrillation: Secondary | ICD-10-CM | POA: Diagnosis not present

## 2020-02-01 MED ORDER — DILTIAZEM HCL ER COATED BEADS 120 MG PO CP24: 120.0000 mg | ORAL_CAPSULE | Freq: Every day | ORAL | 3 refills | Status: DC

## 2020-02-01 MED ORDER — APIXABAN 5 MG PO TABS: 5.0000 mg | ORAL_TABLET | Freq: Two times a day (BID) | ORAL | 6 refills | Status: DC

## 2020-02-01 NOTE — Patient Instructions (Signed)
Medication Instructions:  START ELIQUIS 5 MG ONE TABLET TWICE DAILY  START DILTIAZEM 120 MG ONCE DAILY  *If you need a refill on your cardiac medications before your next appointment, please call your pharmacy*   Lab Work: If you have labs (blood work) drawn today and your tests are completely normal, you will receive your results only by: [name] MyChart Message (if you have MyChart) OR . A paper copy in the mail If you have any lab test that is abnormal or we need to change your treatment, we will call you to review the results.   Testing/Procedures: Your physician has requested that you have an echocardiogram. Echocardiography is a painless test that uses sound waves to create images of your heart. It provides your doctor with information about the size and shape of your heart and how well your heart's chambers and valves are working. This procedure takes approximately one hour. There are no restrictions for this procedure.[address]     Follow-Up: At [facility], you and your health needs are our priority.  As part of our continuing mission to provide you with exceptional heart care, we have created designated Provider Care Teams.  These Care Teams include your primary Cardiologist (physician) and Advanced Practice Providers (APPs -  Physician Assistants and Nurse Practitioners) who all work together to provide you with the care you need, when you need it.  We recommend signing up for the patient portal called "MyChart".  Sign up information is provided on this After Visit Summary.  MyChart is used to connect with patients for Virtual Visits (Telemedicine).  Patients are able to view lab/test results, encounter notes, upcoming appointments, etc.  Non-urgent messages can be sent to your provider as well.   To learn more about what you can do with MyChart, go to [email or web address].    Your next appointment:   6 month(s)  The format for your next appointment:   Either  In Person or Virtual  Provider:   You may see Olga Millers, MD or one of the following Advanced Practice Providers on your designated Care Team:    Corine Shelter, PA-C  [place], [place]  Edd Fabian, [place]    Other Instructions REFERRAL TO PULMONARY FOR SLEEP APNEA AND CPAP

## 2020-02-28 ENCOUNTER — Telehealth: Payer: Self-pay | Admitting: *Deleted

## 2020-02-28 NOTE — Telephone Encounter (Signed)
Pulmonology appointment schedule  for 03/29/20, unable to leave a message,letter mailed.

## 2020-03-07 ENCOUNTER — Ambulatory Visit (HOSPITAL_COMMUNITY): Payer: BC Managed Care – PPO | Attending: Cardiovascular Disease

## 2020-03-07 ENCOUNTER — Other Ambulatory Visit: Payer: Self-pay

## 2020-03-07 DIAGNOSIS — I4891 Unspecified atrial fibrillation: Secondary | ICD-10-CM | POA: Diagnosis not present

## 2020-03-07 DIAGNOSIS — I420 Dilated cardiomyopathy: Secondary | ICD-10-CM | POA: Insufficient documentation

## 2020-03-08 ENCOUNTER — Encounter: Payer: Self-pay | Admitting: *Deleted

## 2020-04-09 ENCOUNTER — Institutional Professional Consult (permissible substitution): Payer: BC Managed Care – PPO | Admitting: Critical Care Medicine

## 2020-04-09 NOTE — Progress Notes (Deleted)
Synopsis: Referred in July 2021 for OSA by Lewayne Bunting, MD.  Subjective:   PATIENT ID: Dakota Hernandez GENDER: male DOB: 08-Aug-1966, MRN: [id number]  No chief complaint on file.   HPI  Referred by cardiology-Dr. Jens Som for sleep apnea Has CPAP, noncompliant with it Previously a patient of Dr. Shelle Iron; last seen in 2012   No results found for: NITRICOXIDE  Past Medical History:  Diagnosis Date  . Atrial fibrillation (HCC)    a. s/p multiple DCCV's ---> now rate-controlled. On Eliquis for anticoagulation  . Cardiomyopathy (HCC)    a. EF previously at 25-30% (Tachycardia-mediated?) --> at 55-60% by echo in 03/2016  . Diabetes (HCC)    Previously on glucophage; now on no meds  . ED (erectile dysfunction)   . Hypertension   . Hypogonadism male   . Obesity   . OSA (obstructive sleep apnea)    a. on CPAP     Family History  Problem Relation Age of Onset  . Heart attack Father 66  . Heart disease Father 49  . Heart attack Other        cousin  . Heart disease Other      Past Surgical History:  Procedure Laterality Date  . APPENDECTOMY  1981  . CARDIOVERSION N/A 03/24/2013   Procedure: CARDIOVERSION;  Surgeon: Lewayne Bunting, MD;  Location: [facility] ENDOSCOPY;  Service: Cardiovascular;  Laterality: N/A;  . EYE SURGERY  in 1st grade   Left eye  . LAPAROSCOPIC GASTRIC SLEEVE RESECTION  8/13    Social History   Socioeconomic History  . Marital status: Married    Spouse name: [name]  . Number of children: N  . Years of education: Not on file  . Highest education level: Not on file  Occupational History  . 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 Smoking status: Never Smoker  . Smokeless tobacco: Never Used  Vaping Use  . Vaping Use: Never used  Substance and Sexual Activity  . Alcohol use: No  . Drug use: No  . Sexual activity: Yes  Other Topics Concern  . Not on file  Social History Narrative    Got married 08/2010      Regular Exercise- yes         Social Determinants of Health   Financial Resource Strain:   . Difficulty of Paying Living Expenses:   Food Insecurity:   . Worried About Programme researcher, broadcasting/film/video in the Last Year:   . Barista in the Last Year:   Transportation Needs:   . Freight forwarder (Medical):   [name] Lack of Transportation (Non-Medical):   Physical Activity:   . Days of Exercise per Week:   . Minutes of Exercise per Session:   Stress:   . Feeling of Stress :   Social Connections:   . Frequency of Communication with Friends and Family:   . Frequency of Social Gatherings with Friends and Family:   . Attends Religious Services:   . Active Member of Clubs or Organizations:   . Attends Banker Meetings:   [name] Marital Status:   Intimate Partner Violence:   . Fear of Current or Ex-Partner:   . Emotionally Abused:   [name] Physically Abused:   . Sexually Abused:      Allergies  Allergen Reactions  . Hydrochlorothiazide     REACTION: ED     Immunization History  Administered Date(s) Administered  . Influenza Split 07/15/2012    Outpatient Medications Prior to Visit  Medication Sig Dispense Refill  . apixaban (ELIQUIS) 5 MG TABS tablet Take 1 tablet (5 mg total) by mouth 2 (two) times daily. 60 tablet 6  . Cholecalciferol (VITAMIN D3) 1000 UNITS CAPS Take 1 capsule by mouth daily.      [name] diltiazem (CARDIZEM CD) 120 MG 24 hr capsule Take 1 capsule (120 mg total) by mouth daily. 90 capsule 3  . lisinopril (ZESTRIL) 20 MG tablet TAKE 1 TABLET BY MOUTH  DAILY 90 tablet 3  . metoprolol (TOPROL-XL) 200 MG 24 hr tablet TAKE 1 TABLET BY MOUTH  DAILY 90 tablet 3  . omeprazole (PRILOSEC OTC) 20 MG tablet Take 20 mg by mouth daily.     No facility-administered medications prior to visit.    ROS   Objective:  There were no vitals filed for this visit.   on *** LPM *** RA BMI Readings from Last 3 Encounters:  02/01/20 52.37 kg/m    04/11/18 48.78 kg/m  02/16/17 51.08 kg/m   Wt Readings from Last 3 Encounters:  02/01/20 (!) 365 lb (165.6 kg)  04/11/18 (!) 340 lb (154.2 kg)  02/16/17 (!) 356 lb (161.5 kg)    Physical Exam   CBC    Component Value Date/Time   WBC 5.2 04/11/2018 1154   WBC 6.7 12/20/2015 1509   RBC 4.93 04/11/2018 1154   RBC 5.11 12/20/2015 1509   HGB 14.8 04/11/2018 1154   HCT 43.4 04/11/2018 1154   PLT 178 04/11/2018 1154   MCV 88 04/11/2018 1154   MCH 30.0 04/11/2018 1154   MCH 30.9 12/20/2015 1509   MCHC 34.1 04/11/2018 1154   MCHC 34.6 12/20/2015 1509   RDW 14.6 04/11/2018 1154   LYMPHSABS 1.5 04/20/2013 1546   MONOABS 0.5 04/20/2013 1546   EOSABS 0.1 04/20/2013 1546   BASOSABS 0.0 04/20/2013 1546    CHEMISTRY No results for input(s): NA, K, CL, CO2, GLUCOSE, BUN, CREATININE, CALCIUM, MG, PHOS in the last 168 hours. CrCl cannot be calculated (Patient's most recent lab result is older than the maximum 21 days allowed.). ***  Chest Imaging- films reviewed: ***  Pulmonary Functions Testing Results: No flowsheet data found.  Pathology: ***  Echocardiogram: ***  Heart Catheterization: ***    Assessment & Plan:   No diagnosis found.    Current Outpatient Medications:  .  apixaban (ELIQUIS) 5 MG TABS tablet, Take 1 tablet (5 mg total) by mouth 2 (two) times daily., Disp: 60 tablet, Rfl: 6 .  Cholecalciferol (VITAMIN D3) 1000 UNITS CAPS, Take 1 capsule by mouth daily.  , Disp: , Rfl:  .  diltiazem (CARDIZEM CD) 120 MG 24 hr capsule, Take 1 capsule (120 mg total) by mouth daily., Disp: 90 capsule, Rfl: 3 .  lisinopril (ZESTRIL) 20 MG tablet, TAKE 1 TABLET BY MOUTH  DAILY, Disp: 90 tablet, Rfl: 3 .  metoprolol (TOPROL-XL) 200 MG 24 hr tablet, TAKE 1 TABLET BY MOUTH  DAILY, Disp: 90 tablet, Rfl: 3 .  omeprazole (PRILOSEC OTC) 20 MG tablet, Take 20 mg by mouth daily., Disp: , Rfl:    I spent *** minutes on this encounter, including face to face time and non-face to  face time spent reviewing records, charting, coordinating care, etc.   Steffanie Dunn, DO [facility] 04/09/2020 7:47 AM

## 2020-04-14 ENCOUNTER — Emergency Department (HOSPITAL_COMMUNITY): Payer: BC Managed Care – PPO

## 2020-04-14 ENCOUNTER — Inpatient Hospital Stay (HOSPITAL_COMMUNITY)
Admission: EM | Admit: 2020-04-14 | Discharge: 2020-04-16 | DRG: 871 | Disposition: A | Discharge status: Home / Self Care | Payer: BC Managed Care – PPO | Attending: Internal Medicine | Admitting: Internal Medicine

## 2020-04-14 ENCOUNTER — Other Ambulatory Visit: Payer: Self-pay

## 2020-04-14 ENCOUNTER — Encounter (HOSPITAL_COMMUNITY): Payer: Self-pay | Admitting: Emergency Medicine

## 2020-04-14 DIAGNOSIS — S40811A Abrasion of right upper arm, initial encounter: Secondary | ICD-10-CM | POA: Diagnosis present

## 2020-04-14 DIAGNOSIS — L03116 Cellulitis of left lower limb: Secondary | ICD-10-CM | POA: Diagnosis not present

## 2020-04-14 DIAGNOSIS — L03115 Cellulitis of right lower limb: Secondary | ICD-10-CM | POA: Diagnosis not present

## 2020-04-14 DIAGNOSIS — J9601 Acute respiratory failure with hypoxia: Secondary | ICD-10-CM | POA: Diagnosis not present

## 2020-04-14 DIAGNOSIS — Z6841 Body Mass Index (BMI) 40.0 and over, adult: Secondary | ICD-10-CM | POA: Diagnosis not present

## 2020-04-14 DIAGNOSIS — R0603 Acute respiratory distress: Secondary | ICD-10-CM | POA: Diagnosis not present

## 2020-04-14 DIAGNOSIS — R0602 Shortness of breath: Secondary | ICD-10-CM | POA: Diagnosis not present

## 2020-04-14 DIAGNOSIS — S40812A Abrasion of left upper arm, initial encounter: Secondary | ICD-10-CM | POA: Diagnosis not present

## 2020-04-14 DIAGNOSIS — I4821 Permanent atrial fibrillation: Secondary | ICD-10-CM | POA: Diagnosis present

## 2020-04-14 DIAGNOSIS — I10 Essential (primary) hypertension: Secondary | ICD-10-CM | POA: Diagnosis not present

## 2020-04-14 DIAGNOSIS — Z7901 Long term (current) use of anticoagulants: Secondary | ICD-10-CM

## 2020-04-14 DIAGNOSIS — Z20822 Contact with and (suspected) exposure to covid-19: Secondary | ICD-10-CM | POA: Diagnosis not present

## 2020-04-14 DIAGNOSIS — W5503XA Scratched by cat, initial encounter: Secondary | ICD-10-CM | POA: Diagnosis not present

## 2020-04-14 DIAGNOSIS — E860 Dehydration: Secondary | ICD-10-CM | POA: Diagnosis present

## 2020-04-14 DIAGNOSIS — Z8249 Family history of ischemic heart disease and other diseases of the circulatory system: Secondary | ICD-10-CM | POA: Diagnosis not present

## 2020-04-14 DIAGNOSIS — N179 Acute kidney failure, unspecified: Secondary | ICD-10-CM | POA: Diagnosis not present

## 2020-04-14 DIAGNOSIS — S80812A Abrasion, left lower leg, initial encounter: Secondary | ICD-10-CM | POA: Diagnosis not present

## 2020-04-14 DIAGNOSIS — E119 Type 2 diabetes mellitus without complications: Secondary | ICD-10-CM | POA: Diagnosis present

## 2020-04-14 DIAGNOSIS — I5033 Acute on chronic diastolic (congestive) heart failure: Secondary | ICD-10-CM | POA: Diagnosis present

## 2020-04-14 DIAGNOSIS — R509 Fever, unspecified: Secondary | ICD-10-CM | POA: Diagnosis present

## 2020-04-14 DIAGNOSIS — K219 Gastro-esophageal reflux disease without esophagitis: Secondary | ICD-10-CM | POA: Diagnosis present

## 2020-04-14 DIAGNOSIS — I110 Hypertensive heart disease with heart failure: Secondary | ICD-10-CM | POA: Diagnosis present

## 2020-04-14 DIAGNOSIS — G4733 Obstructive sleep apnea (adult) (pediatric): Secondary | ICD-10-CM | POA: Diagnosis not present

## 2020-04-14 DIAGNOSIS — S80811A Abrasion, right lower leg, initial encounter: Secondary | ICD-10-CM | POA: Diagnosis present

## 2020-04-14 DIAGNOSIS — E662 Morbid (severe) obesity with alveolar hypoventilation: Secondary | ICD-10-CM | POA: Diagnosis not present

## 2020-04-14 DIAGNOSIS — R918 Other nonspecific abnormal finding of lung field: Secondary | ICD-10-CM | POA: Diagnosis not present

## 2020-04-14 DIAGNOSIS — I420 Dilated cardiomyopathy: Secondary | ICD-10-CM | POA: Diagnosis not present

## 2020-04-14 DIAGNOSIS — Z79899 Other long term (current) drug therapy: Secondary | ICD-10-CM

## 2020-04-14 DIAGNOSIS — A419 Sepsis, unspecified organism: Principal | ICD-10-CM | POA: Diagnosis present

## 2020-04-14 DIAGNOSIS — I517 Cardiomegaly: Secondary | ICD-10-CM | POA: Diagnosis not present

## 2020-04-14 DIAGNOSIS — R0600 Dyspnea, unspecified: Secondary | ICD-10-CM

## 2020-04-14 DIAGNOSIS — I4891 Unspecified atrial fibrillation: Secondary | ICD-10-CM

## 2020-04-14 DIAGNOSIS — R05 Cough: Secondary | ICD-10-CM | POA: Diagnosis not present

## 2020-04-14 HISTORY — DX: Type 2 diabetes mellitus without complications: E11.9

## 2020-04-14 LAB — TSH: TSH: 0.692 u[IU]/mL (ref 0.350–4.500)

## 2020-04-14 LAB — I-STAT ARTERIAL BLOOD GAS, ED
Acid-base deficit: 1 mmol/L (ref 0.0–2.0)
Bicarbonate: 23.7 mmol/L (ref 20.0–28.0)
Calcium, Ion: 1.12 mmol/L — ABNORMAL LOW (ref 1.15–1.40)
HCT: 39 % (ref 39.0–52.0)
Hemoglobin: 13.3 g/dL (ref 13.0–17.0)
O2 Saturation: 97 %
Patient temperature: 101
Potassium: 3.8 mmol/L (ref 3.5–5.1)
Sodium: 139 mmol/L (ref 135–145)
TCO2: 25 mmol/L (ref 22–32)
pCO2 arterial: 39.1 mmHg (ref 32.0–48.0)
pH, Arterial: 7.396 (ref 7.350–7.450)
pO2, Arterial: 95 mmHg (ref 83.0–108.0)

## 2020-04-14 LAB — COMPREHENSIVE METABOLIC PANEL
ALT: 19 U/L (ref 0–44)
AST: 22 U/L (ref 15–41)
Albumin: 3.6 g/dL (ref 3.5–5.0)
Alkaline Phosphatase: 67 U/L (ref 38–126)
Anion gap: 11 (ref 5–15)
BUN: 14 mg/dL (ref 6–20)
CO2: 23 mmol/L (ref 22–32)
Calcium: 8.6 mg/dL — ABNORMAL LOW (ref 8.9–10.3)
Chloride: 104 mmol/L (ref 98–111)
Creatinine, Ser: 1.15 mg/dL (ref 0.61–1.24)
GFR calc Af Amer: >60 mL/min (ref >60)
GFR calc non Af Amer: >60 mL/min (ref >60)
Glucose, Bld: 125 mg/dL — ABNORMAL HIGH (ref 70–99)
Potassium: 4.1 mmol/L (ref 3.5–5.1)
Sodium: 138 mmol/L (ref 135–145)
Total Bilirubin: 1.1 mg/dL (ref 0.3–1.2)
Total Protein: 7.2 g/dL (ref 6.5–8.1)

## 2020-04-14 LAB — URINALYSIS, ROUTINE W REFLEX MICROSCOPIC
Bacteria, UA: NONE SEEN
Bilirubin Urine: NEGATIVE
Glucose, UA: NEGATIVE mg/dL
Ketones, ur: NEGATIVE mg/dL
Leukocytes,Ua: NEGATIVE
Nitrite: NEGATIVE
Protein, ur: 30 mg/dL — AB
Specific Gravity, Urine: 1.026 (ref 1.005–1.030)
pH: 5 (ref 5.0–8.0)

## 2020-04-14 LAB — CBC
HCT: 45.9 % (ref 39.0–52.0)
Hemoglobin: 14.9 g/dL (ref 13.0–17.0)
MCH: 28.8 pg (ref 26.0–34.0)
MCHC: 32.5 g/dL (ref 30.0–36.0)
MCV: 88.6 fL (ref 80.0–100.0)
Platelets: 157 10*3/uL (ref 150–400)
RBC: 5.18 MIL/uL (ref 4.22–5.81)
RDW: 12.8 % (ref 11.5–15.5)
WBC: 11.5 10*3/uL — ABNORMAL HIGH (ref 4.0–10.5)
nRBC: 0 % (ref 0.0–0.2)

## 2020-04-14 LAB — PROCALCITONIN: Procalcitonin: 1.36 ng/mL

## 2020-04-14 LAB — LACTIC ACID, PLASMA
Lactic Acid, Venous: 1.8 mmol/L (ref 0.5–1.9)
Lactic Acid, Venous: 1.8 mmol/L (ref 0.5–1.9)
Lactic Acid, Venous: 1.5 mmol/L (ref 0.5–1.9)

## 2020-04-14 LAB — TROPONIN I (HIGH SENSITIVITY)
Troponin I (High Sensitivity): 7 ng/L (ref <18)
Troponin I (High Sensitivity): 9 ng/L (ref <18)

## 2020-04-14 LAB — PROTIME-INR
INR: 1.2 (ref 0.8–1.2)
Prothrombin Time: 15 seconds (ref 11.4–15.2)

## 2020-04-14 LAB — CBG MONITORING, ED: Glucose-Capillary: 133 mg/dL — ABNORMAL HIGH (ref 70–99)

## 2020-04-14 LAB — BRAIN NATRIURETIC PEPTIDE: B Natriuretic Peptide: 310.3 pg/mL — ABNORMAL HIGH (ref 0.0–100.0)

## 2020-04-14 LAB — POC SARS CORONAVIRUS 2 AG -  ED: SARS Coronavirus 2 Ag: NEGATIVE

## 2020-04-14 LAB — SARS CORONAVIRUS 2 BY RT PCR (HOSPITAL ORDER, PERFORMED IN ~~LOC~~ HOSPITAL LAB): SARS Coronavirus 2: NEGATIVE

## 2020-04-14 LAB — APTT: aPTT: 28 seconds (ref 24–36)

## 2020-04-14 MED ORDER — SODIUM CHLORIDE 0.9% FLUSH: 3.0000 mL | Freq: Once | INTRAVENOUS | Status: DC

## 2020-04-14 MED ORDER — POTASSIUM CHLORIDE 20 MEQ/15ML (10%) PO SOLN
10.0000 meq | Freq: Once | ORAL | Status: DC
  Filled 2020-04-14: qty 15

## 2020-04-14 MED ORDER — METOPROLOL TARTRATE 5 MG/5ML IV SOLN: 5.0000 mg | INTRAVENOUS | Status: DC | PRN

## 2020-04-14 MED ORDER — SODIUM CHLORIDE 0.9 % IV BOLUS
1000.0000 mL | Freq: Once | INTRAVENOUS | Status: AC
  Administered 2020-04-14: 1000 mL via INTRAVENOUS

## 2020-04-14 MED ORDER — IOHEXOL 350 MG/ML SOLN
100.0000 mL | Freq: Once | INTRAVENOUS | Status: AC | PRN
  Administered 2020-04-14: 100 mL via INTRAVENOUS

## 2020-04-14 MED ORDER — DILTIAZEM HCL ER COATED BEADS 120 MG PO CP24
120.0000 mg | ORAL_CAPSULE | Freq: Every day | ORAL | Status: DC
  Administered 2020-04-14 – 2020-04-16 (×3): 120 mg via ORAL
  Filled 2020-04-14 (×4): qty 1

## 2020-04-14 MED ORDER — VANCOMYCIN HCL 1250 MG/250ML IV SOLN
1250.0000 mg | Freq: Two times a day (BID) | INTRAVENOUS | Status: DC
  Filled 2020-04-14: qty 250

## 2020-04-14 MED ORDER — ACETAMINOPHEN 650 MG RE SUPP: 650.0000 mg | Freq: Four times a day (QID) | RECTAL | Status: DC | PRN

## 2020-04-14 MED ORDER — SODIUM CHLORIDE 0.9 % IV SOLN
2.0000 g | Freq: Three times a day (TID) | INTRAVENOUS | Status: DC
  Administered 2020-04-14: 2 g via INTRAVENOUS
  Filled 2020-04-14: qty 2

## 2020-04-14 MED ORDER — LISINOPRIL 20 MG PO TABS
20.0000 mg | ORAL_TABLET | Freq: Every day | ORAL | Status: DC
  Administered 2020-04-14 – 2020-04-15 (×2): 20 mg via ORAL
  Filled 2020-04-14 (×2): qty 1

## 2020-04-14 MED ORDER — ACETAMINOPHEN 325 MG PO TABS: 650.0000 mg | ORAL_TABLET | Freq: Four times a day (QID) | ORAL | Status: DC | PRN

## 2020-04-14 MED ORDER — ONDANSETRON HCL 4 MG PO TABS: 4.0000 mg | ORAL_TABLET | Freq: Four times a day (QID) | ORAL | Status: DC | PRN

## 2020-04-14 MED ORDER — METOPROLOL SUCCINATE ER 100 MG PO TB24
200.0000 mg | ORAL_TABLET | Freq: Every day | ORAL | Status: DC
  Administered 2020-04-14 – 2020-04-16 (×3): 200 mg via ORAL
  Filled 2020-04-14 (×3): qty 2

## 2020-04-14 MED ORDER — VANCOMYCIN HCL 10 G IV SOLR
2500.0000 mg | Freq: Once | INTRAVENOUS | Status: DC
  Administered 2020-04-14: 2500 mg via INTRAVENOUS
  Filled 2020-04-14: qty 2500

## 2020-04-14 MED ORDER — SODIUM CHLORIDE 0.9% FLUSH
3.0000 mL | Freq: Once | INTRAVENOUS | Status: AC
  Administered 2020-04-14: 3 mL via INTRAVENOUS

## 2020-04-14 MED ORDER — SODIUM CHLORIDE 0.9% FLUSH
3.0000 mL | Freq: Two times a day (BID) | INTRAVENOUS | Status: DC
  Administered 2020-04-15 – 2020-04-16 (×3): 3 mL via INTRAVENOUS

## 2020-04-14 MED ORDER — APIXABAN 5 MG PO TABS
5.0000 mg | ORAL_TABLET | Freq: Two times a day (BID) | ORAL | Status: DC
  Administered 2020-04-14 – 2020-04-16 (×5): 5 mg via ORAL
  Filled 2020-04-14 (×7): qty 1

## 2020-04-14 MED ORDER — ACETAMINOPHEN 325 MG PO TABS
650.0000 mg | ORAL_TABLET | Freq: Once | ORAL | Status: AC | PRN
  Administered 2020-04-14: 650 mg via ORAL
  Filled 2020-04-14: qty 2

## 2020-04-14 MED ORDER — CEFAZOLIN SODIUM-DEXTROSE 2-4 GM/100ML-% IV SOLN
2.0000 g | Freq: Three times a day (TID) | INTRAVENOUS | Status: DC
  Administered 2020-04-15 – 2020-04-16 (×5): 2 g via INTRAVENOUS
  Filled 2020-04-14 (×5): qty 100

## 2020-04-14 MED ORDER — METRONIDAZOLE IN NACL 5-0.79 MG/ML-% IV SOLN
500.0000 mg | Freq: Once | INTRAVENOUS | Status: AC
  Administered 2020-04-14: 500 mg via INTRAVENOUS
  Filled 2020-04-14: qty 100

## 2020-04-14 MED ORDER — FUROSEMIDE 10 MG/ML IJ SOLN
40.0000 mg | Freq: Once | INTRAMUSCULAR | Status: AC
  Administered 2020-04-14: 40 mg via INTRAVENOUS
  Filled 2020-04-14: qty 4

## 2020-04-14 MED ORDER — ONDANSETRON HCL 4 MG/2ML IJ SOLN: 4.0000 mg | Freq: Four times a day (QID) | INTRAMUSCULAR | Status: DC | PRN

## 2020-04-14 NOTE — ED Notes (Signed)
Clicking off labs, poc cbg completed by others prior to pt coming to room.

## 2020-04-14 NOTE — ED Provider Notes (Addendum)
Dakota Hernandez is a 54 y.o. male, presenting to the ED with shortness of breath beginning earlier this morning.  During my assessment, he denies chest pain, abdominal pain, neurologic deficits, syncope, lower extremity edema/pain, or any other complaints. He has had 2 Covid vaccine injections, the last of which at the beginning of June.   HPI from [place], PA-C: "Dakota Hernandez is a 54 y.o. male.  54 y.o male with a PMH of Afib on eliquis and reports compliance, DM, HTN presents to the ED with a chief complaint of shortness of breath.  Patient reports waking up around 4 AM, he felt somewhat dehydrated, usually wakes up at this time.  Reports that he was unable to get hydrated enough, around 6:00 2 hours later began to feel short of breath, this occurred while he was resting, reports that he felt unwell and felt somewhat warm, like he could not take a deep breath.  He did come into the hospital and on arrival was found to have a fever of 102.  He is currently anticoagulated.  He is followed closely by cardiologist Dr. Jens Som, he was given a pulmonologist referral reports his appointment is not until this upcoming Wednesday.  Does report compliance with his Eliquis.  Has also had both Covid vaccines via [employer].  Nuys any swelling to his legs, palpitations, chest pain, sick exposures."  Past Medical History:  Diagnosis Date  . Atrial fibrillation (HCC)    a. s/p multiple DCCV's ---> now rate-controlled. On Eliquis for anticoagulation  . Cardiomyopathy (HCC)    a. EF previously at 25-30% (Tachycardia-mediated?) --> at 55-60% by echo in 03/2016  . Diabetes (HCC)    Previously on glucophage; now on no meds  . ED (erectile dysfunction)   . Hypertension   . Hypogonadism male   . Obesity   . OSA (obstructive sleep apnea)    a. on CPAP    Physical Exam  BP 119/72 (BP Location: Right Arm)   Pulse (!) 115   Temp 100 F (37.8 C) (Oral)   Resp (!) 21   Ht 5\' 10"  (1.778 m)   Wt (!)  163.3 kg   SpO2 99%   BMI 51.65 kg/m   Physical Exam Vitals and nursing note reviewed.  Constitutional:      General: He is in acute distress.     Appearance: He is well-developed. He is obese. He is ill-appearing. He is not diaphoretic.  HENT:     Head: Normocephalic and atraumatic.     Mouth/Throat:     Mouth: Mucous membranes are moist.     Pharynx: Oropharynx is clear.  Eyes:     Conjunctiva/sclera: Conjunctivae normal.  Cardiovascular:     Rate and Rhythm: Tachycardia present. Rhythm irregularly irregular.     Pulses: Normal pulses.          Radial pulses are 2+ on the right side and 2+ on the left side.       Posterior tibial pulses are 2+ on the right side and 2+ on the left side.     Heart sounds: Normal heart sounds.     Comments: Tactile temperature in the extremities appropriate and equal bilaterally. Pulmonary:     Effort: Tachypnea and respiratory distress present.     Breath sounds: Normal breath sounds.     Comments: SPO2 96% on room air, however, patient with increased work of breathing and tachypnea. Abdominal:     Palpations: Abdomen is soft.  Tenderness: There is no abdominal tenderness. There is no guarding.  Musculoskeletal:     Cervical back: Neck supple.     Right lower leg: No edema.     Left lower leg: No edema.  Lymphadenopathy:     Cervical: No cervical adenopathy.  Skin:    General: Skin is warm and dry.  Neurological:     Mental Status: He is alert.  Psychiatric:        Mood and Affect: Mood and affect normal.        Speech: Speech normal.        Behavior: Behavior normal.     ED Course/Procedures     Procedures   Abnormal Labs Reviewed  CBC - Abnormal; Notable for the following components:      Result Value   WBC 11.5 (*)    All other components within normal limits  URINALYSIS, ROUTINE W REFLEX MICROSCOPIC - Abnormal; Notable for the following components:   Color, Urine AMBER (*)    Hgb urine dipstick SMALL (*)     Protein, ur 30 (*)    All other components within normal limits  COMPREHENSIVE METABOLIC PANEL - Abnormal; Notable for the following components:   Glucose, Bld 125 (*)    Calcium 8.6 (*)    All other components within normal limits  BRAIN NATRIURETIC PEPTIDE - Abnormal; Notable for the following components:   B Natriuretic Peptide 310.3 (*)    All other components within normal limits  CBG MONITORING, ED - Abnormal; Notable for the following components:   Glucose-Capillary 133 (*)    All other components within normal limits  I-STAT ARTERIAL BLOOD GAS, ED - Abnormal; Notable for the following components:   Calcium, Ion 1.12 (*)    All other components within normal limits    Labs Reviewed  CBC - Abnormal; Notable for the following components:      Result Value   WBC 11.5 (*)    All other components within normal limits  URINALYSIS, ROUTINE W REFLEX MICROSCOPIC - Abnormal; Notable for the following components:   Color, Urine AMBER (*)    Hgb urine dipstick SMALL (*)    Protein, ur 30 (*)    All other components within normal limits  COMPREHENSIVE METABOLIC PANEL - Abnormal; Notable for the following components:   Glucose, Bld 125 (*)    Calcium 8.6 (*)    All other components within normal limits  BRAIN NATRIURETIC PEPTIDE - Abnormal; Notable for the following components:   B Natriuretic Peptide 310.3 (*)    All other components within normal limits  CBG MONITORING, ED - Abnormal; Notable for the following components:   Glucose-Capillary 133 (*)    All other components within normal limits  I-STAT ARTERIAL BLOOD GAS, ED - Abnormal; Notable for the following components:   Calcium, Ion 1.12 (*)    All other components within normal limits  SARS CORONAVIRUS 2 BY RT PCR (HOSPITAL ORDER, PERFORMED IN [facility])  CULTURE, BLOOD (ROUTINE X 2)  CULTURE, BLOOD (ROUTINE X 2)  URINE CULTURE  LACTIC ACID, PLASMA  APTT  PROTIME-INR  TSH  LACTIC ACID, PLASMA  LACTIC  ACID, PLASMA  BASIC METABOLIC PANEL  PROCALCITONIN  POC SARS CORONAVIRUS 2 AG -  ED  TROPONIN I (HIGH SENSITIVITY)  TROPONIN I (HIGH SENSITIVITY)    CT Angio Chest PE W and/or Wo Contrast  Result Date: 04/14/2020 CLINICAL DATA:  54 year old male with acute cough and shortness of breath. EXAM:  CT ANGIOGRAPHY CHEST WITH CONTRAST TECHNIQUE: Multidetector CT imaging of the chest was performed using the standard protocol during bolus administration of intravenous contrast. Multiplanar CT image reconstructions and MIPs were obtained to evaluate the vascular anatomy. CONTRAST:  OMNIPAQUE IOHEXOL 350 MG/ML SOLN COMPARISON:  None. FINDINGS: Cardiovascular: This is a technically borderline study given respiratory motion artifact and less than optimal contrast opacification within the LOWER lungs. No definite pulmonary emboli are identified. No large or central pulmonary emboli are identified. Cardiomegaly is identified. There is no evidence of thoracic aortic aneurysm or dissection. No pericardial effusion. Mediastinum/Nodes: No enlarged mediastinal, hilar, or axillary lymph nodes. Thyroid gland, trachea, and esophagus demonstrate no significant findings. Lungs/Pleura: Lungs are clear. There is no evidence of airspace disease, consolidation, mass, definite nodule, pleural effusion or pneumothorax. Upper Abdomen: No acute abnormality. Gastric surgical changes are present. Musculoskeletal: No acute or suspicious bony abnormalities are noted Review of the MIP images confirms the above findings. IMPRESSION: 1. No acute abnormality. 2. Technically borderline study without definite pulmonary emboli. 3. Cardiomegaly. Electronically Signed   By: Harmon Pier M.D.   On: 04/14/2020 15:39   DG Chest Portable 1 View  Result Date: 04/14/2020 CLINICAL DATA:  Shortness of breath EXAM: PORTABLE CHEST 1 VIEW COMPARISON:  None. FINDINGS: Generous heart size accentuated by portable technique. There is no edema, consolidation,  effusion, or pneumothorax. Artifact from EKG leads. IMPRESSION: Borderline heart size. Clear lungs. Electronically Signed   By: Marnee Spring M.D.   On: 04/14/2020 13:23     EKG Interpretation  Date/Time:  Sunday April 14 2020 08:05:34 EDT Ventricular Rate:  131 PR Interval:    QRS Duration: 74 QT Interval:  312 QTC Calculation: 460 R Axis:   61 Text Interpretation: Atrial fibrillation with rapid ventricular response Nonspecific ST and T wave abnormality Abnormal ECG Confirmed by Meridee Score [phone]) on 04/14/2020 11:34:21 AM       MDM   Clinical Course as of Apr 14 1852  Sun Apr 14, 2020  5240 54 year old male with history of paroxysmal A. fib here with fever and feeling dehydrated.  Cough and shortness of breath.  No abdominal pain vomiting diarrhea or urinary symptoms.   [MB]  1850 Spoke with Dr. Allena Katz, hospitalist. Agrees to admit the patient.   [SJ]    Clinical Course User Index [MB] Terrilee Files, MD [SJ] Anselm Pancoast, PA-C   Patient care handoff report received from [facility], [place]. Plan: Continue to assess the patient's symptoms, review CTA chest, disposition accordingly.     Patient presents with complaint of shortness of breath beginning this morning. He was found to be in A. fib RVR shortly thereafter, also found to be febrile. He had not had his daily medications for the day, but these were administered here.  He had some improvement in his pulse rate following administration of these medications. Increased work of breathing, however, no acute abnormalities on chest x-ray or CT of the chest.  No lactic acidosis.  Covid negative. Other pathologies that have similar presentation, such as serotonin syndrome, hyperthyroidism/thyroid storm, myocarditis, etc. were considered.   Patient does not seem to be on medications that would be thought to cause serotonin syndrome. Troponin negative.  ABG without concerning abnormalities.  TSH within normal range.  In  short, patient tachycardic, febrile, mild leukocytosis, with significant increased work of breathing and shortness of breath. Unknown source for infection.  Code sepsis activated and brought antibiotics started. Patient will need to be admitted  due to his significant increased work of breathing and shortness of breath.  He does not have a significantly low SPO2, however, he did show improvement in his breathing effort with supplemental O2.   Findings and plan of care discussed with Cathi Roan, MD.   Vitals:   04/14/20 1209 04/14/20 1230 04/14/20 1330 04/14/20 1346  BP:  119/88 119/72   Pulse:  (!) 126 (!) 115   Resp:  (!) 26 (!) 23 (!) 21  Temp: 100 F (37.8 C)     TempSrc: Oral     SpO2:  99% 99%   Weight:      Height:       Vitals:   04/14/20 1346 04/14/20 1525 04/14/20 1632 04/14/20 1700  BP:  (!) 166/111  (!) 144/96  Pulse:  (!) 114  97  Resp: (!) 21 (!) 21  (!) 22  Temp:   (!) 101 F (38.3 C)   TempSrc:   Oral   SpO2:  96%  97%  Weight:      Height:          Concepcion Living 04/14/20 1853    Anselm Pancoast, PA-C 04/14/20 1855    Maia Plan, MD 04/20/20 2043

## 2020-04-14 NOTE — Progress Notes (Signed)
Pharmacy Antibiotic Note  Dakota Hernandez is a 54 y.o. male admitted on 04/14/2020 with SOB, possible pneumonia.  Pharmacy has been consulted for vancomycin and cefepime dosing.  BP 119/88, HR 126, Tmax 102F, O2 99% on 2L, WBC 11.5, lactic acid 1.8, SCr 1.15, CrCl 132mL/min  CXR is unremarkable  Plan: Vancomycin 2,500mg  IV x1 then vancomycin 1,250mg  IV every 12 hours. Goal trough 15-20 mcg/mL.   Cefepime 2gm every 8 hours  Height: 5\' 10"  (177.8 cm) Weight: (!) 163.3 kg (360 lb) IBW/kg (Calculated) : 73  Temp (24hrs), Avg:100.8 F (38.2 C), Min:100 F (37.8 C), Max:102 F (38.9 C)  Recent Labs  Lab 04/14/20 0812  WBC 11.5*  CREATININE 1.15  LATICACIDVEN 1.8    Estimated Creatinine Clearance: 116 mL/min (by C-G formula based on SCr of 1.15 mg/dL).    Allergies  Allergen Reactions  . Hydrochlorothiazide     REACTION: ED    Antimicrobials this admission: 7/11 vancomycin >>>  7/11 cefepime >>>  7/11 metronidazole x1  Dose adjustments this admission  Microbiology results: 7/11 BCx: pending 7/11 UCx: pending  9/11, PharmD PGY1 Acute Care Pharmacy Resident Please refer to [facility] for unit-specific pharmacist

## 2020-04-14 NOTE — ED Triage Notes (Signed)
Pt states he woke up at 4am with SOB, cough, dry throat, thirst, and feeling lightheaded when he bends over. States Dr. Jens Som referred him to pulmonologist and he has appt Wednesday but he is unsure why.

## 2020-04-14 NOTE — ED Notes (Signed)
Pt placed on 2L O2 [place] for comfort.

## 2020-04-14 NOTE — H&P (Addendum)
History and Physical    Dakota Hernandez GYJ:[id number] DOB: 1966-08-20 DOA: 04/14/2020  PCP: Cassandria Anger, MD  Patient coming from: Home  I have personally briefly reviewed patient's old medical records in [place]  Chief Complaint: Shortness of breath  HPI: Dakota Hernandez is a 54 y.o. male with medical history significant for permanent atrial fibrillation on Eliquis, cardiomyopathy (suspected tachycardia mediated with most recent EF 55-60%), diet-controlled type 2 diabetes, hypertension, and OSA who presents to the ED for evaluation of shortness of breath.  Patient states around 12:30 AM morning of 04/14/2020 he awoke with subjective fever and chills.  Around 6:30 AM he became short of breath while at rest.  He has had palpitations which he reports is chronic due to his permanent atrial fibrillation.  He has swelling in both of his lower extremities which he also reports is chronic.  He has not had any cough or diaphoresis.  He reports some nausea without emesis.  He had an episode of lightheadedness without syncope or fall.  He reports he was feeling very thirsty and has been drinking a lot of water.  He reports good urine output without dysuria.  He reports regular bowel movements without diarrhea.  He has multiple excoriations on his lower legs from scratching but has not seen any open or obviously infected wounds on his body.  He denies any obvious bug/tick bites.  He denies any sick contacts.  He denies any recent changes in his medications.  ED Course:  Initial vitals showed BP 160/107, pulse 83, RR 24, temp 100.4 Fahrenheit.  T-max has been 102 Fahrenheit while in the ED.  Labs are notable for WBC 11.5, hemoglobin 14.9, platelets 157,000, sodium 138, potassium 4.1, bicarb 23, BUN 14, creatinine 1.15, LFTs within normal limits, BNP 310.3, lactic acid 1.8, high-sensitivity troponin I 7, TSH 0.692.  ABG showed pH 7.396, PCO2 39.1, PO2 95.  Urinalysis is negative for  UTI.  Blood and urine cultures were obtained and pending.  POC SARS-CoV-2 antigen and SARS-CoV-2 PCR were both negative.  Portable chest x-ray is negative for focal consolidation, edema, or effusion.  CTA chest PE study is negative for acute abnormality.  Cardiomegaly noted.  Per radiology read this was a technically borderline study.  Patient was given 1 L normal saline, Tylenol, and started on empiric antibiotics with IV vancomycin, cefepime, and Flagyl.  His home medications were restarted including Toprol-XL, diltiazem, lisinopril, and Eliquis.  Due to fever of unknown vision, A. fib with rapid ventricular rate, and continued dyspnea the hospitalist service was consulted to admit for further evaluation management.  Review of Systems: All systems reviewed and are negative except as documented in history of present illness above.   Past Medical History:  Diagnosis Date  . Atrial fibrillation ([place])    a. s/p multiple DCCV's ---> now rate-controlled. On Eliquis for anticoagulation  . Cardiomyopathy ([place])    a. EF previously at 25-30% (Tachycardia-mediated?) --> at 55-60% by echo in 03/2016  . Diabetes ([place])    Previously on glucophage; now on no meds  . ED (erectile dysfunction)   . Hypertension   . Hypogonadism male   . Obesity   . OSA (obstructive sleep apnea)    a. on CPAP    Past Surgical History:  Procedure Laterality Date  . APPENDECTOMY  1981  . CARDIOVERSION N/A 03/24/2013   Procedure: CARDIOVERSION;  Surgeon: Lelon Perla, MD;  Location: [facility] ENDOSCOPY;  Service: Cardiovascular;  Laterality: N/A;  .  EYE SURGERY  in 1st grade   Left eye  . LAPAROSCOPIC GASTRIC SLEEVE RESECTION  8/13    Social History:  reports that he has never smoked. He has never used smokeless tobacco. He reports that he does not drink alcohol and does not use drugs.  Allergies  Allergen Reactions  . Hydrochlorothiazide     REACTION: ED    Family History  Problem Relation Age of Onset  .  Heart attack Father 34  . Heart disease Father 63  . Heart attack Other        cousin  . Heart disease Other      Prior to Admission medications   Medication Sig Start Date End Date Taking? Authorizing Provider  apixaban (ELIQUIS) 5 MG TABS tablet Take 1 tablet (5 mg total) by mouth 2 (two) times daily. 02/01/20   Lelon Perla, MD  Cholecalciferol (VITAMIN D3) 1000 UNITS CAPS Take 1 capsule by mouth daily.      [provider]  diltiazem (CARDIZEM CD) 120 MG 24 hr capsule Take 1 capsule (120 mg total) by mouth daily. 02/01/20 05/01/20  Lelon Perla, MD  lisinopril (ZESTRIL) 20 MG tablet TAKE 1 TABLET BY MOUTH  DAILY Patient taking differently: Take 20 mg by mouth daily.  08/11/19   Lelon Perla, MD  metoprolol (TOPROL-XL) 200 MG 24 hr tablet TAKE 1 TABLET BY MOUTH  DAILY Patient taking differently: Take 200 mg by mouth daily.  08/11/19   Lelon Perla, MD  omeprazole (PRILOSEC OTC) 20 MG tablet Take 20 mg by mouth daily.    [provider]    Physical Exam: Vitals:   04/14/20 1632 04/14/20 1700 04/14/20 1808 04/14/20 1930  BP:  (!) 144/96  137/89  Pulse:  97  (!) 125  Resp:  (!) 22  (!) 29  Temp: (!) 101 F (38.3 C)  99.6 F (37.6 C)   TempSrc: Oral  Oral   SpO2:  97%  94%  Weight:      Height:       Constitutional: Morbidly obese man resting in the right lateral decubitus position, NAD, calm, comfortable Eyes: PERRL, lids and conjunctivae normal ENMT: Mucous membranes are moist. Posterior pharynx clear of any exudate or lesions.Normal dentition.  Neck: normal, supple, no masses. Respiratory: clear to auscultation bilaterally, no wheezing, no crackles.  Slight increased respiratory effort. No accessory muscle use.  Cardiovascular: Irregularly irregular, no murmurs / rubs / gallops.  Tight +1 pitting edema bilateral lower extremities. 2+ pedal pulses. Abdomen: Obese abdomen with no tenderness, no masses palpated. No hepatosplenomegaly. Bowel  sounds positive.  Musculoskeletal: no clubbing / cyanosis. No joint deformity upper and lower extremities. Good ROM, no contractures. Normal muscle tone.  Skin: Multiple excoriations of various stages of healing on lower extremities from scratching.  No open wounds or obvious drainage.  Slight areas of erythema. Neurologic: CN 2-12 grossly intact. Sensation intact, Strength 5/5 in all 4.  Psychiatric: Normal judgment and insight. Alert and oriented x 3. Normal mood.     Labs on Admission: I have personally reviewed following labs and imaging studies  CBC: Recent Labs  Lab 04/14/20 0812 04/14/20 1732  WBC 11.5*  --   HGB 14.9 13.3  HCT 45.9 39.0  MCV 88.6  --   PLT 157  --    Basic Metabolic Panel: Recent Labs  Lab 04/14/20 0812 04/14/20 1732  NA 138 139  K 4.1 3.8  CL 104  --  CO2 23  --   GLUCOSE 125*  --   BUN 14  --   CREATININE 1.15  --   CALCIUM 8.6*  --    GFR: Estimated Creatinine Clearance: 116 mL/min (by C-G formula based on SCr of 1.15 mg/dL). Liver Function Tests: Recent Labs  Lab 04/14/20 0812  AST 22  ALT 19  ALKPHOS 67  BILITOT 1.1  PROT 7.2  ALBUMIN 3.6   No results for input(s): LIPASE, AMYLASE in the last 168 hours. No results for input(s): AMMONIA in the last 168 hours. Coagulation Profile: Recent Labs  Lab 04/14/20 1645  INR 1.2   Cardiac Enzymes: No results for input(s): CKTOTAL, CKMB, CKMBINDEX, TROPONINI in the last 168 hours. BNP (last 3 results) No results for input(s): PROBNP in the last 8760 hours. HbA1C: No results for input(s): HGBA1C in the last 72 hours. CBG: Recent Labs  Lab 04/14/20 0817  GLUCAP 133*   Lipid Profile: No results for input(s): CHOL, HDL, LDLCALC, TRIG, CHOLHDL, LDLDIRECT in the last 72 hours. Thyroid Function Tests: Recent Labs    04/14/20 1646  TSH 0.692   Anemia Panel: No results for input(s): VITAMINB12, FOLATE, FERRITIN, TIBC, IRON, RETICCTPCT in the last 72 hours. Urine analysis:     Component Value Date/Time   COLORURINE AMBER (A) 04/14/2020 1429   APPEARANCEUR CLEAR 04/14/2020 1429   LABSPEC 1.026 04/14/2020 1429   PHURINE 5.0 04/14/2020 1429   GLUCOSEU NEGATIVE 04/14/2020 1429   GLUCOSEU NEGATIVE 06/14/2008 1020   HGBUR SMALL (A) 04/14/2020 1429   BILIRUBINUR NEGATIVE 04/14/2020 1429   KETONESUR NEGATIVE 04/14/2020 1429   PROTEINUR 30 (A) 04/14/2020 1429   UROBILINOGEN 0.2 mg/dL 06/14/2008 1020   NITRITE NEGATIVE 04/14/2020 1429   LEUKOCYTESUR NEGATIVE 04/14/2020 1429    Radiological Exams on Admission: CT Angio Chest PE W and/or Wo Contrast  Result Date: 04/14/2020 CLINICAL DATA:  54 year old male with acute cough and shortness of breath. EXAM: CT ANGIOGRAPHY CHEST WITH CONTRAST TECHNIQUE: Multidetector CT imaging of the chest was performed using the standard protocol during bolus administration of intravenous contrast. Multiplanar CT image reconstructions and MIPs were obtained to evaluate the vascular anatomy. CONTRAST:  194m OMNIPAQUE IOHEXOL 350 MG/ML SOLN COMPARISON:  None. FINDINGS: Cardiovascular: This is a technically borderline study given respiratory motion artifact and less than optimal contrast opacification within the LOWER lungs. No definite pulmonary emboli are identified. No large or central pulmonary emboli are identified. Cardiomegaly is identified. There is no evidence of thoracic aortic aneurysm or dissection. No pericardial effusion. Mediastinum/Nodes: No enlarged mediastinal, hilar, or axillary lymph nodes. Thyroid gland, trachea, and esophagus demonstrate no significant findings. Lungs/Pleura: Lungs are clear. There is no evidence of airspace disease, consolidation, mass, definite nodule, pleural effusion or pneumothorax. Upper Abdomen: No acute abnormality. Gastric surgical changes are present. Musculoskeletal: No acute or suspicious bony abnormalities are noted Review of the MIP images confirms the above findings. IMPRESSION: 1. No acute  abnormality. 2. Technically borderline study without definite pulmonary emboli. 3. Cardiomegaly. Electronically Signed   By: JMargarette CanadaM.D.   On: 04/14/2020 15:39   DG Chest Portable 1 View  Result Date: 04/14/2020 CLINICAL DATA:  Shortness of breath EXAM: PORTABLE CHEST 1 VIEW COMPARISON:  None. FINDINGS: Generous heart size accentuated by portable technique. There is no edema, consolidation, effusion, or pneumothorax. Artifact from EKG leads. IMPRESSION: Borderline heart size. Clear lungs. Electronically Signed   By: JMonte FantasiaM.D.   On: 04/14/2020 13:23    EKG: Independently reviewed.  Atrial fibrillation with RVR, rate 131. Rate is faster when compared to prior.  Assessment/Plan Principal Problem:   Febrile illness Active Problems:   Essential hypertension   OSA (obstructive sleep apnea)   Diabetes (HCC)   Permanent atrial fibrillation with RVR (HCC)   Congestive dilated cardiomyopathy (H[facility])   Dyspnea  Dakota Hernandez is a 54 y.o. male with medical history significant for permanent atrial fibrillation on Eliquis, cardiomyopathy (suspected tachycardia mediated with most recent EF 55-60%), diet-controlled type 2 diabetes, hypertension, and OSA who is admitted with febrile illness and atrial fibrillation with RVR.  Febrile illness: Patient with T-max up to 102 Fahrenheit without obvious source on initial work-up.  Suspect some early cellulitis of his lower extremities related to excoriation wounds from frequent scratching.  White count mildly elevated 11.5, procalcitonin 1.36, SARS-CoV-2 antigen and PCR negative.  Urinalysis negative for UTI.  Blood cultures obtained and pending.  No obvious infection or PE seen on CTA chest. -Narrow antibiotics IV Ancef -Follow blood cultures -Check ESR, CRP, HIV  Dyspnea/cardiomyopathy: Patient reporting increased dyspnea which has been constant since morning of admission.  CXR and CTA chest without evidence of PE, pneumonia, or pulmonary  edema.  He has history of known cardiomyopathy, felt tachycardia mediated with last EF 55-60% by echocardiogram on 03/07/2020.  He does appear volume overloaded on admission, probably exacerbated by reduced atrial kick in setting of A. fib RVR.  BNP is 310.3.  Suspect some component of untreated OSA and possible OHS as well.  He has not been hypoxic and ABG is reassuring. -Give IV Lasix 40 mg once -Monitor strict I/O's and daily weights -Continue A. fib rate control -Patient willing to try CPAP while in hospital  Permanent atrial fibrillation with RVR: Follows with cardiology, Dr. Stanford Breed.  Underwent DCCV x3 in the past, now currently on rate control therapy along with Eliquis anticoagulation.  Presented with RVR with rate up to 130s. -Continue home diltiazem 120 mg daily, have room to increase dose if needed and blood pressure allows -Continue Toprol-XL 200 mg daily -Continue Eliquis 5 mg twice daily  Hypertension: Blood pressure currently stable.  Continue lisinopril, diltiazem, and Toprol-XL.  Diet-controlled type 2 diabetes: Check A1c and continue monitor.  OSA: Use CPAP in the past however has not been using CPAP for some time now.  He is willing to try CPAP tonight while in hospital.  May have some component of OHS as well.  He has had appointment with pulmonology scheduled for 04/17/2020.  DVT prophylaxis: Eliquis Code Status: Full code, confirmed with patient Family Communication: Discussed with patient, he has discussed with family Disposition Plan: From home and likely discharge to home. Consults called: None Admission status:  Status is: Observation  The patient remains OBS appropriate and will d/c before 2 midnights.  Dispo: The patient is from: Home              Anticipated d/c is to: Home              Anticipated d/c date is: 1 day pending adequate heart rate control, symptomatic improvement, and fever work-up.              Patient currently is not medically stable to  d/c.   Zada Finders MD [facility]  If 7PM-7AM, please contact night-coverage [email or web address]  04/14/2020, 8:12 PM

## 2020-04-14 NOTE — ED Provider Notes (Signed)
[facility] EMERGENCY DEPARTMENT Provider Note   CSN: [id number] Arrival date & time: 04/14/20  0754     History Chief Complaint  Patient presents with  . Shortness of Breath  . Dizziness    Dakota Hernandez is a 54 y.o. male.  54 y.o male with a PMH of Afib on eliquis and reports compliance, DM, HTN presents to the ED with a chief complaint of shortness of breath.  Patient reports waking up around 4 AM, he felt somewhat dehydrated, usually wakes up at this time.  Reports that he was unable to get hydrated enough, around 6:00 2 hours later began to feel short of breath, this occurred while he was resting, reports that he felt unwell and felt somewhat warm, like he could not take a deep breath.  He did come into the hospital and on arrival was found to have a fever of 102.  He is currently anticoagulated.  He is followed closely by cardiologist Dr. Jens Som, he was given a pulmonologist referral reports his appointment is not until this upcoming Wednesday.  Does report compliance with his Eliquis.  Has also had both Covid vaccines via [employer].  Nuys any swelling to his legs, palpitations, chest pain, sick exposures.    The history is provided by the patient.  Shortness of Breath Severity:  Mild Onset quality:  Sudden Duration:  6 hours Timing:  Constant Progression:  Improving Chronicity:  New Associated symptoms: fever   Associated symptoms: no abdominal pain, no chest pain, no cough, no sore throat, no vomiting and no wheezing   Dizziness Associated symptoms: shortness of breath   Associated symptoms: no chest pain, no diarrhea, no nausea and no vomiting        Past Medical History:  Diagnosis Date  . Atrial fibrillation (HCC)    a. s/p multiple DCCV's ---> now rate-controlled. On Eliquis for anticoagulation  . Cardiomyopathy (HCC)    a. EF previously at 25-30% (Tachycardia-mediated?) --> at 55-60% by echo in 03/2016  . Diabetes (HCC)    Previously on  glucophage; now on no meds  . ED (erectile dysfunction)   . Hypertension   . Hypogonadism male   . Obesity   . OSA (obstructive sleep apnea)    a. on CPAP    Patient Active Problem List   Diagnosis Date Noted  . Congestive dilated cardiomyopathy (HCC) 02/14/2013  . Vitamin B12 deficiency 01/17/2013  . Diabetes (HCC) 07/15/2012  . Atrial fibrillation (HCC) 07/15/2012  . Onychomycosis 01/28/2012  . Bladder neck obstruction 01/28/2012  . Hypogonadism male 03/27/2011  . Vitamin D deficiency disease 03/27/2011  . OSA (obstructive sleep apnea) 03/13/2011  . HYPERGLYCEMIA 09/22/2010  . VENOUS INSUFFICIENCY, CHRONIC 03/24/2010  . FATIGUE 03/24/2010  . Edema 03/24/2010  . OBESITY, MORBID 02/08/2008  . HYPERTENSION 02/08/2008    Past Surgical History:  Procedure Laterality Date  . APPENDECTOMY  1981  . CARDIOVERSION N/A 03/24/2013   Procedure: CARDIOVERSION;  Surgeon: Lewayne Bunting, MD;  Location: [facility] ENDOSCOPY;  Service: Cardiovascular;  Laterality: N/A;  . EYE SURGERY  in 1st grade   Left eye  . LAPAROSCOPIC GASTRIC SLEEVE RESECTION  8/13       Family History  Problem Relation Age of Onset  . Heart attack Father 96  . Heart disease Father 49  . Heart attack Other        cousin  . Heart disease Other     Social History   Tobacco Use  . Smoking  status: Never Smoker  . Smokeless tobacco: Never Used  Vaping Use  . Vaping Use: Never used  Substance Use Topics  . Alcohol use: No  . Drug use: No    Home Medications Prior to Admission medications   Medication Sig Start Date End Date Taking? Authorizing Provider  apixaban (ELIQUIS) 5 MG TABS tablet Take 1 tablet (5 mg total) by mouth 2 (two) times daily. 02/01/20   Lewayne Bunting, MD  Cholecalciferol (VITAMIN D3) 1000 UNITS CAPS Take 1 capsule by mouth daily.      [provider]  diltiazem (CARDIZEM CD) 120 MG 24 hr capsule Take 1 capsule (120 mg total) by mouth daily. 02/01/20 05/01/20  Lewayne Bunting,  MD  lisinopril (ZESTRIL) 20 MG tablet TAKE 1 TABLET BY MOUTH  DAILY Patient taking differently: Take 20 mg by mouth daily.  08/11/19   Lewayne Bunting, MD  metoprolol (TOPROL-XL) 200 MG 24 hr tablet TAKE 1 TABLET BY MOUTH  DAILY Patient taking differently: Take 200 mg by mouth daily.  08/11/19   Lewayne Bunting, MD  omeprazole (PRILOSEC OTC) 20 MG tablet Take 20 mg by mouth daily.    [provider]    Allergies    Hydrochlorothiazide  Review of Systems   Review of Systems  Constitutional: Positive for chills and fever.  HENT: Negative for sore throat.   Respiratory: Positive for shortness of breath. Negative for cough and wheezing.   Cardiovascular: Negative for chest pain.  Gastrointestinal: Negative for abdominal pain, diarrhea, nausea and vomiting.  Genitourinary: Negative for flank pain.  Musculoskeletal: Negative for back pain.  Neurological: Positive for dizziness.  All other systems reviewed and are negative.   Physical Exam Updated Vital Signs BP 119/88   Pulse (!) 126   Temp 100 F (37.8 C) (Oral)   Resp (!) 21   Ht 5\' 10"  (1.778 m)   Wt (!) 163.3 kg   SpO2 99%   BMI 51.65 kg/m   Physical Exam Vitals and nursing note reviewed.  Constitutional:      Appearance: He is well-developed.  HENT:     Head: Normocephalic and atraumatic.     Mouth/Throat:     Mouth: Mucous membranes are moist.  Eyes:     Pupils: Pupils are equal, round, and reactive to light.  Cardiovascular:     Rate and Rhythm: Tachycardia present. Rhythm irregular.  Pulmonary:     Effort: Pulmonary effort is normal. Tachypnea present.     Breath sounds: No decreased breath sounds or wheezing.  Chest:     Chest wall: No tenderness.  Abdominal:     Palpations: Abdomen is soft. There is no mass.     Tenderness: There is no abdominal tenderness.  Musculoskeletal:     Right lower leg: No tenderness. No edema.     Left lower leg: No tenderness. No edema.  Skin:    General: Skin is  warm and dry.  Neurological:     Mental Status: He is alert and oriented to person, place, and time.     ED Results / Procedures / Treatments   Labs (all labs ordered are listed, but only abnormal results are displayed) Labs Reviewed  CBC - Abnormal; Notable for the following components:      Result Value   WBC 11.5 (*)    All other components within normal limits  COMPREHENSIVE METABOLIC PANEL - Abnormal; Notable for the following components:   Glucose, Bld 125 (*)  Calcium 8.6 (*)    All other components within normal limits  CBG MONITORING, ED - Abnormal; Notable for the following components:   Glucose-Capillary 133 (*)    All other components within normal limits  LACTIC ACID, PLASMA  URINALYSIS, ROUTINE W REFLEX MICROSCOPIC  POC SARS CORONAVIRUS 2 AG -  ED    EKG EKG Interpretation  Date/Time:  Sunday April 14 2020 08:05:34 EDT Ventricular Rate:  131 PR Interval:    QRS Duration: 74 QT Interval:  312 QTC Calculation: 460 R Axis:   61 Text Interpretation: Atrial fibrillation with rapid ventricular response Nonspecific ST and T wave abnormality Abnormal ECG Confirmed by Meridee Score [phone]) on 04/14/2020 11:34:21 AM   Radiology DG Chest Portable 1 View  Result Date: 04/14/2020 CLINICAL DATA:  Shortness of breath EXAM: PORTABLE CHEST 1 VIEW COMPARISON:  None. FINDINGS: Generous heart size accentuated by portable technique. There is no edema, consolidation, effusion, or pneumothorax. Artifact from EKG leads. IMPRESSION: Borderline heart size. Clear lungs. Electronically Signed   By: Marnee Spring M.D.   On: 04/14/2020 13:23    Procedures Procedures (including critical care time)  Medications Ordered in ED Medications  sodium chloride flush (NS) 0.9 % injection 3 mL (0 mLs Intravenous Hold 04/14/20 1327)  apixaban (ELIQUIS) tablet 5 mg (has no administration in time range)  diltiazem (CARDIZEM CD) 24 hr capsule 120 mg (120 mg Oral Given 04/14/20 1317)   lisinopril (ZESTRIL) tablet 20 mg (20 mg Oral Given 04/14/20 1318)  metoprolol succinate (TOPROL-XL) 24 hr tablet 200 mg (200 mg Oral Given 04/14/20 1317)  sodium chloride flush (NS) 0.9 % injection 3 mL (3 mLs Intravenous Given 04/14/20 1326)  acetaminophen (TYLENOL) tablet 650 mg (650 mg Oral Given 04/14/20 1004)    ED Course  I have reviewed the triage vital signs and the nursing notes.  Pertinent labs & imaging results that were available during my care of the patient were reviewed by me and considered in my medical decision making (see chart for details).  Clinical Course as of Apr 15 1351  Sun Apr 14, 2020  4813 54 year old male with history of paroxysmal A. fib here with fever and feeling dehydrated.  Cough and shortness of breath.  No abdominal pain vomiting diarrhea or urinary symptoms.   [MB]    Clinical Course User Index [MB] Terrilee Files, MD   MDM Rules/Calculators/A&P  Patient with an extensive PMH including Afib anticoagulated on eliquis BID and diltiazem presents to the ED with a chief complaint of shortness of breath which began this morning around 4 AM, patient does have a history of paroxysmal A. fib, reports he thinks that this likely occurred prior to arrival.  He has had multiple cardioversions performed in the past, reports he goes back into A. fib after being cardioverted.  Extensive review of his medical chart, reveals that he had a visit with Dr. Jens Som of cardiology on April 2021, note states that patient was not taking his tilt or his apixaban.  According to his note, he was going to be given a referral for pulmonology for sleep apnea and CPAP.  Patient was then changed to Eliquis 5 mg days daily, along with delta 120 mg once daily.  Primary valuation patient is febrile with a temperature of 100, EKG shows atrial fibrillation, with a rate of 126, reports he did not take any of his medication prior to arrival in the ED.  Ports his shortness of breath was at  rest, this has  now resolved.  Reports he did not have any chest pain, no leg swelling, cough, sick contacts.  Patient was vaccinated x2 for COVID-19.  He reports, " feel much better ", but he was placed on also on 2 L nasal cannula as his oxygen saturation was dropping to the 93%.  Potation of his labs revealed a CBC with a slight leukocytosis, no changes in his hemoglobin.  CMP without any electrolyte derangement, creatinine function is within normal limits, LFTs are unremarkable.Lactic acid is negative. CBG is 133. Rapid covid is negative.   Xray of his chest did not show any consolidation, pneumothorax or pleural effusion.  Last echocardiogram on file from June 2021 shows an EF of 55   Left ventricular ejection fraction, by estimation, is 55 to 60%. The   2:59 PM patient provided with 1 L bolus, was given all his home meds, heart rate has improved to 115, fever is now at 100, mild tachypnea.  No source of infection found during my evaluation.  However, due to shortness of breath suddenly, and prior cardiology visit stating that he was not compliant with anticoagulation feel that is reasonable to obtain a CT angio to rule out any pulmonary embolism.  Chest x-ray is unremarkable.  Patient symptoms have improved however he is requiring 2 L of O2 in order to Stat at 99%.   Patient care signed out to Langston Masker PA at shift change pending CTA.    Portions of this note were generated with Scientist, clinical (histocompatibility and immunogenetics). Dictation errors may occur despite best attempts at proofreading.   Final Clinical Impression(s) / ED Diagnoses Final diagnoses:  Shortness of breath  Atrial fibrillation, unspecified type [facility])    Rx / DC Orders ED Discharge Orders    None       Claude Manges, PA-C 04/14/20 1501    Terrilee Files, MD 04/17/20 1924

## 2020-04-15 DIAGNOSIS — Z8249 Family history of ischemic heart disease and other diseases of the circulatory system: Secondary | ICD-10-CM | POA: Diagnosis not present

## 2020-04-15 DIAGNOSIS — E662 Morbid (severe) obesity with alveolar hypoventilation: Secondary | ICD-10-CM | POA: Diagnosis present

## 2020-04-15 DIAGNOSIS — I420 Dilated cardiomyopathy: Secondary | ICD-10-CM | POA: Diagnosis present

## 2020-04-15 DIAGNOSIS — K219 Gastro-esophageal reflux disease without esophagitis: Secondary | ICD-10-CM | POA: Diagnosis present

## 2020-04-15 DIAGNOSIS — S40811A Abrasion of right upper arm, initial encounter: Secondary | ICD-10-CM | POA: Diagnosis present

## 2020-04-15 DIAGNOSIS — I10 Essential (primary) hypertension: Secondary | ICD-10-CM

## 2020-04-15 DIAGNOSIS — G4733 Obstructive sleep apnea (adult) (pediatric): Secondary | ICD-10-CM

## 2020-04-15 DIAGNOSIS — I5033 Acute on chronic diastolic (congestive) heart failure: Secondary | ICD-10-CM | POA: Diagnosis present

## 2020-04-15 DIAGNOSIS — I4821 Permanent atrial fibrillation: Secondary | ICD-10-CM | POA: Diagnosis present

## 2020-04-15 DIAGNOSIS — A419 Sepsis, unspecified organism: Secondary | ICD-10-CM | POA: Diagnosis present

## 2020-04-15 DIAGNOSIS — S80811A Abrasion, right lower leg, initial encounter: Secondary | ICD-10-CM | POA: Diagnosis present

## 2020-04-15 DIAGNOSIS — J9601 Acute respiratory failure with hypoxia: Secondary | ICD-10-CM | POA: Diagnosis present

## 2020-04-15 DIAGNOSIS — Z79899 Other long term (current) drug therapy: Secondary | ICD-10-CM | POA: Diagnosis not present

## 2020-04-15 DIAGNOSIS — L03116 Cellulitis of left lower limb: Secondary | ICD-10-CM | POA: Diagnosis present

## 2020-04-15 DIAGNOSIS — R509 Fever, unspecified: Secondary | ICD-10-CM | POA: Diagnosis present

## 2020-04-15 DIAGNOSIS — R0603 Acute respiratory distress: Secondary | ICD-10-CM | POA: Diagnosis not present

## 2020-04-15 DIAGNOSIS — Z6841 Body Mass Index (BMI) 40.0 and over, adult: Secondary | ICD-10-CM | POA: Diagnosis not present

## 2020-04-15 DIAGNOSIS — L03115 Cellulitis of right lower limb: Secondary | ICD-10-CM | POA: Diagnosis present

## 2020-04-15 DIAGNOSIS — E860 Dehydration: Secondary | ICD-10-CM | POA: Diagnosis present

## 2020-04-15 DIAGNOSIS — S80812A Abrasion, left lower leg, initial encounter: Secondary | ICD-10-CM | POA: Diagnosis present

## 2020-04-15 DIAGNOSIS — I110 Hypertensive heart disease with heart failure: Secondary | ICD-10-CM | POA: Diagnosis present

## 2020-04-15 DIAGNOSIS — Z20822 Contact with and (suspected) exposure to covid-19: Secondary | ICD-10-CM | POA: Diagnosis present

## 2020-04-15 DIAGNOSIS — W5503XA Scratched by cat, initial encounter: Secondary | ICD-10-CM | POA: Diagnosis not present

## 2020-04-15 DIAGNOSIS — S40812A Abrasion of left upper arm, initial encounter: Secondary | ICD-10-CM | POA: Diagnosis present

## 2020-04-15 DIAGNOSIS — N179 Acute kidney failure, unspecified: Secondary | ICD-10-CM | POA: Diagnosis present

## 2020-04-15 DIAGNOSIS — Z7901 Long term (current) use of anticoagulants: Secondary | ICD-10-CM | POA: Diagnosis not present

## 2020-04-15 DIAGNOSIS — E119 Type 2 diabetes mellitus without complications: Secondary | ICD-10-CM | POA: Diagnosis present

## 2020-04-15 LAB — HEMOGLOBIN A1C
Hgb A1c MFr Bld: 5.7 % — ABNORMAL HIGH (ref 4.8–5.6)
Mean Plasma Glucose: 116.89 mg/dL

## 2020-04-15 LAB — CBC
HCT: 41.7 % (ref 39.0–52.0)
Hemoglobin: 14 g/dL (ref 13.0–17.0)
MCH: 29.8 pg (ref 26.0–34.0)
MCHC: 33.6 g/dL (ref 30.0–36.0)
MCV: 88.7 fL (ref 80.0–100.0)
Platelets: 136 10*3/uL — ABNORMAL LOW (ref 150–400)
RBC: 4.7 MIL/uL (ref 4.22–5.81)
RDW: 13.2 % (ref 11.5–15.5)
WBC: 8.5 10*3/uL (ref 4.0–10.5)
nRBC: 0 % (ref 0.0–0.2)

## 2020-04-15 LAB — BASIC METABOLIC PANEL
Anion gap: 12 (ref 5–15)
BUN: 18 mg/dL (ref 6–20)
CO2: 20 mmol/L — ABNORMAL LOW (ref 22–32)
Calcium: 8 mg/dL — ABNORMAL LOW (ref 8.9–10.3)
Chloride: 103 mmol/L (ref 98–111)
Creatinine, Ser: 1.56 mg/dL — ABNORMAL HIGH (ref 0.61–1.24)
GFR calc Af Amer: 58 mL/min — ABNORMAL LOW (ref >60)
GFR calc non Af Amer: 50 mL/min — ABNORMAL LOW (ref >60)
Glucose, Bld: 120 mg/dL — ABNORMAL HIGH (ref 70–99)
Potassium: 3.9 mmol/L (ref 3.5–5.1)
Sodium: 135 mmol/L (ref 135–145)

## 2020-04-15 LAB — CBG MONITORING, ED: Glucose-Capillary: 86 mg/dL (ref 70–99)

## 2020-04-15 LAB — GLUCOSE, CAPILLARY: Glucose-Capillary: 108 mg/dL — ABNORMAL HIGH (ref 70–99)

## 2020-04-15 LAB — HIV ANTIBODY (ROUTINE TESTING W REFLEX): HIV Screen 4th Generation wRfx: NONREACTIVE

## 2020-04-15 LAB — C-REACTIVE PROTEIN: CRP: 13.7 mg/dL — ABNORMAL HIGH (ref <1.0)

## 2020-04-15 LAB — SEDIMENTATION RATE: Sed Rate: 21 mm/hr — ABNORMAL HIGH (ref 0–16)

## 2020-04-15 MED ORDER — PANTOPRAZOLE SODIUM 40 MG PO TBEC
40.0000 mg | DELAYED_RELEASE_TABLET | Freq: Every day | ORAL | Status: DC
  Administered 2020-04-15 – 2020-04-16 (×2): 40 mg via ORAL
  Filled 2020-04-15 (×2): qty 1

## 2020-04-15 MED ORDER — LEVALBUTEROL HCL 0.63 MG/3ML IN NEBU
0.6300 mg | INHALATION_SOLUTION | Freq: Three times a day (TID) | RESPIRATORY_TRACT | Status: DC
  Administered 2020-04-16: 0.63 mg via RESPIRATORY_TRACT
  Filled 2020-04-15: qty 3

## 2020-04-15 MED ORDER — LEVALBUTEROL HCL 0.63 MG/3ML IN NEBU
0.6300 mg | INHALATION_SOLUTION | Freq: Three times a day (TID) | RESPIRATORY_TRACT | Status: DC
  Administered 2020-04-15: 0.63 mg via RESPIRATORY_TRACT
  Filled 2020-04-15: qty 3

## 2020-04-15 MED ORDER — INSULIN ASPART 100 UNIT/ML ~~LOC~~ SOLN: 0.0000 [IU] | Freq: Three times a day (TID) | SUBCUTANEOUS | Status: DC

## 2020-04-15 NOTE — Plan of Care (Signed)
Patient received to room 14. NAD noted. 02 at 3L/[place] on. Assessment completed. Tele showing Atrial fib.

## 2020-04-15 NOTE — Plan of Care (Signed)
Plan of care initiated.

## 2020-04-15 NOTE — Progress Notes (Signed)
Pt states he doesn't wear a CPAP at home.  Pt doesn't want to wear CPAP for the night.

## 2020-04-15 NOTE — Progress Notes (Addendum)
PROGRESS NOTE  Dakota Hernandez TKW:[id number] DOB: [birth year] DOA: 04/14/2020 PCP: Cassandria Anger, MD  HPI/Recap of past 24 hours: HPI from Dr Clare Gandy Sampsel is a 54 y.o. male with medical history significant for permanent atrial fibrillation on Eliquis, cardiomyopathy (suspected tachycardia mediated with most recent EF 55-60%), diet-controlled type 2 diabetes, hypertension, and OSA who presents to the ED for evaluation of shortness of breath and subjective fever. Patient states around morning of 04/14/2020 he awoke with subjective fever, chills and SOB. Reports chronic palpitation and chronic swelling in both of his lower extremities. Pt has multiple excoriations on his lower legs from scratching as well as scratch marks (from his cat) on his BUE, but has not seen any open or obviously infected wounds on his body.  He denies any obvious bug/tick bites.  He denies any sick contacts.  He denies any recent changes in his medications. In the ED, VS showed BP 160/107, pulse 83, RR 24, temp 100.4 Fahrenheit. T-max has been 102 Fahrenheit while in the ED. Labs are notable for WBC 11.5, BNP 310.3, ABG showed pH 7.396, PCO2 39.1, PO2 95. Urinalysis is negative for UTI.  Blood and urine cultures were obtained and pending.  POC SARS-CoV-2 antigen and SARS-CoV-2 PCR were both negative. Portable chest x-ray and CTA chest are both unremarkable. Due to fever, A. fib with RVR, continued dyspnea the hospitalist service was consulted to admit for further evaluation management.     Today, patient reports having reflux (states he takes omeprazole daily before breakfast religiously), denies any left-sided chest pain, worsening shortness of breath, abdominal pain, nausea/vomiting, fever/chills.    Assessment/Plan: Principal Problem:   Febrile illness Active Problems:   Essential hypertension   OSA (obstructive sleep apnea)   Diabetes (HCC)   Permanent atrial fibrillation with RVR (HCC)    Congestive dilated cardiomyopathy (HCC)   Dyspnea   Sepsis, unknown etiology Fever, tachypnea, tachycardic Currently no source of infection, except for chronic wounds noted on BLE with cat scratch marks on BUE Currently afebrile, with no leukocytosis Procalcitonin 1.36, will trend CRP and ESR elevated, UA negative, UC pending BC x2 pending Chest x-ray unremarkable CTA chest unremarkable For now continue empiric Ancef pending BC Daily CBC, monitor closely  AKI Creatinine bumped to 1.56 on 04/15/2020 Urine negative, UC pending Received Lasix, lisinopril, will hold Due to tachypnea, shortness of breath will hold off on hydration for today and reevaluate in the a.m. Bladder scan to evaluate for retention Daily BMP  Acute hypoxic respiratory failure likely 2/2 acute on chronic diastolic HF Vs OHS Vs OSA Currently requiring about 3 L O2, non at baseline BNP 310 ABG unremarkable Troponin unremarkable, EKG showed A. fib with RVR Echo done 03/07/2020 showed EF of 55 to 60%, unable to evaluate diastolic function Chest x-ray unremarkable We will hold off on further diuresis for now due to AKI Strict I's and O's, daily weights, fluid restriction Duo nebs scheduled Continue supplemental oxygen, plan to wean off CPAP at bedtime and during naps  Permanent A. fib with RVR Rate better controlled Continue diltiazem, Toprol Eliquis for anticoagulation  Hypertension BP stable Continue diltiazem, Toprol, hold lisinopril due to AKI  Diabetes mellitus type 2 A1c 5.7 SSI, Accu-Cheks, hypoglycemic protocol  OSA CPAP during naps and at bedtime Patient has an appointment scheduled with pulmonology on 04/17/2020  Morbid obesity Lifestyle modification advised        Malnutrition Type:      Malnutrition Characteristics:  Nutrition Interventions:       Estimated body mass index is 51.65 kg/m as calculated from the following:   Height as of this encounter: '5\' 10"'   (1.778 m).   Weight as of this encounter: 163.3 kg.     Code Status: Full  Family Communication: Discussed extensively with patient  Disposition Plan: Status is: Inpatient  The patient will require care spanning > 2 midnights and should be moved to inpatient because: Inpatient level of care appropriate due to severity of illness  Dispo: The patient is from: Home              Anticipated d/c is to: Home              Anticipated d/c date is: 2 days              Patient currently is not medically stable to d/c.    Consultants:  None  Procedures:  None  Antimicrobials:  Ancef  DVT prophylaxis: Eliquis   Objective: Vitals:   04/15/20 0820 04/15/20 1009 04/15/20 1030 04/15/20 1056  BP:  120/73 126/77   Pulse: 91 (!) 101 (!) 106 99  Resp: 19 (!) 23 (!) 22 (!) 22  Temp:      TempSrc:      SpO2: 97% 99% 98% 95%  Weight:      Height:        Intake/Output Summary (Last 24 hours) at 04/15/2020 1238 Last data filed at 04/15/2020 1055 Gross per 24 hour  Intake 1200 ml  Output --  Net 1200 ml   Filed Weights   04/14/20 0759  Weight: (!) 163.3 kg    Exam:  General: NAD, alert, oriented x3  Cardiovascular: S1, S2 present  Respiratory: CTAB  Abdomen:  Obese, soft, nontender, nondistended, bowel sounds present  Musculoskeletal: 1+ bilateral pedal edema noted  Skin:  Chronic wound noted on BLE, multiple excoriation of various stages noted in BUE  Psychiatry: Normal mood    Data Reviewed: CBC: Recent Labs  Lab 04/14/20 0812 04/14/20 1732 04/15/20 0552  WBC 11.5*  --  8.5  HGB 14.9 13.3 14.0  HCT 45.9 39.0 41.7  MCV 88.6  --  88.7  PLT 157  --  023*   Basic Metabolic Panel: Recent Labs  Lab 04/14/20 0812 04/14/20 1732 04/15/20 0552  NA 138 139 135  K 4.1 3.8 3.9  CL 104  --  103  CO2 23  --  20*  GLUCOSE 125*  --  120*  BUN 14  --  18  CREATININE 1.15  --  1.56*  CALCIUM 8.6*  --  8.0*   GFR: Estimated Creatinine Clearance: 85.5 mL/min  (A) (by C-G formula based on SCr of 1.56 mg/dL (H)). Liver Function Tests: Recent Labs  Lab 04/14/20 0812  AST 22  ALT 19  ALKPHOS 67  BILITOT 1.1  PROT 7.2  ALBUMIN 3.6   No results for input(s): LIPASE, AMYLASE in the last 168 hours. No results for input(s): AMMONIA in the last 168 hours. Coagulation Profile: Recent Labs  Lab 04/14/20 1645  INR 1.2   Cardiac Enzymes: No results for input(s): CKTOTAL, CKMB, CKMBINDEX, TROPONINI in the last 168 hours. BNP (last 3 results) No results for input(s): PROBNP in the last 8760 hours. HbA1C: Recent Labs    04/15/20 1144  HGBA1C 5.7*   CBG: Recent Labs  Lab 04/14/20 0817  GLUCAP 133*   Lipid Profile: No results for input(s): CHOL, HDL, LDLCALC, TRIG, CHOLHDL,  LDLDIRECT in the last 72 hours. Thyroid Function Tests: Recent Labs    04/14/20 1646  TSH 0.692   Anemia Panel: No results for input(s): VITAMINB12, FOLATE, FERRITIN, TIBC, IRON, RETICCTPCT in the last 72 hours. Urine analysis:    Component Value Date/Time   COLORURINE AMBER (A) 04/14/2020 1429   APPEARANCEUR CLEAR 04/14/2020 1429   LABSPEC 1.026 04/14/2020 1429   PHURINE 5.0 04/14/2020 1429   GLUCOSEU NEGATIVE 04/14/2020 1429   GLUCOSEU NEGATIVE 06/14/2008 1020   HGBUR SMALL (A) 04/14/2020 1429   BILIRUBINUR NEGATIVE 04/14/2020 1429   KETONESUR NEGATIVE 04/14/2020 1429   PROTEINUR 30 (A) 04/14/2020 1429   UROBILINOGEN 0.2 mg/dL 06/14/2008 1020   NITRITE NEGATIVE 04/14/2020 1429   LEUKOCYTESUR NEGATIVE 04/14/2020 1429   Sepsis Labs: '@LABRCNTIP' (procalcitonin:4,lacticidven:4)  ) Recent Results (from the past 240 hour(s))  SARS Coronavirus 2 by RT PCR (hospital order, performed in [place] hospital lab) Nasopharyngeal Nasopharyngeal Swab     Status: None   Collection Time: 04/14/20  3:34 PM   Specimen: Nasopharyngeal Swab  Result Value Ref Range Status   SARS Coronavirus 2 NEGATIVE NEGATIVE Final    Comment: (NOTE) SARS-CoV-2 target nucleic acids  are NOT DETECTED.  The SARS-CoV-2 RNA is generally detectable in upper and lower respiratory specimens during the acute phase of infection. The lowest concentration of SARS-CoV-2 viral copies this assay can detect is 250 copies / mL. A negative result does not preclude SARS-CoV-2 infection and should not be used as the sole basis for treatment or other patient management decisions.  A negative result may occur with improper specimen collection / handling, submission of specimen other than nasopharyngeal swab, presence of viral mutation(s) within the areas targeted by this assay, and inadequate number of viral copies (<250 copies / mL). A negative result must be combined with clinical observations, patient history, and epidemiological information.  Fact Sheet for Patients:   StrictlyIdeas.no  Fact Sheet for Healthcare Providers: BankingDealers.co.za  This test is not yet approved or  cleared by the [place] FDA and has been authorized for detection and/or diagnosis of SARS-CoV-2 by FDA under an Emergency Use Authorization (EUA).  This EUA will remain in effect (meaning this test can be used) for the duration of the COVID-19 declaration under Section 564(b)(1) of the Act, 21 U.S.C. section 360bbb-3(b)(1), unless the authorization is terminated or revoked sooner.  Performed at [facility], [place] [address], [place], [place] [address]       Studies: CT Angio Chest PE W and/or Wo Contrast  Result Date: 04/14/2020 CLINICAL DATA:  54 year old male with acute cough and shortness of breath. EXAM: CT ANGIOGRAPHY CHEST WITH CONTRAST TECHNIQUE: Multidetector CT imaging of the chest was performed using the standard protocol during bolus administration of intravenous contrast. Multiplanar CT image reconstructions and MIPs were obtained to evaluate the vascular anatomy. CONTRAST:  161m OMNIPAQUE IOHEXOL 350 MG/ML SOLN COMPARISON:  None.  FINDINGS: Cardiovascular: This is a technically borderline study given respiratory motion artifact and less than optimal contrast opacification within the LOWER lungs. No definite pulmonary emboli are identified. No large or central pulmonary emboli are identified. Cardiomegaly is identified. There is no evidence of thoracic aortic aneurysm or dissection. No pericardial effusion. Mediastinum/Nodes: No enlarged mediastinal, hilar, or axillary lymph nodes. Thyroid gland, trachea, and esophagus demonstrate no significant findings. Lungs/Pleura: Lungs are clear. There is no evidence of airspace disease, consolidation, mass, definite nodule, pleural effusion or pneumothorax. Upper Abdomen: No acute abnormality. Gastric surgical changes are present. Musculoskeletal:  No acute or suspicious bony abnormalities are noted Review of the MIP images confirms the above findings. IMPRESSION: 1. No acute abnormality. 2. Technically borderline study without definite pulmonary emboli. 3. Cardiomegaly. Electronically Signed   By: Margarette Canada M.D.   On: 04/14/2020 15:39   DG Chest Portable 1 View  Result Date: 04/14/2020 CLINICAL DATA:  Shortness of breath EXAM: PORTABLE CHEST 1 VIEW COMPARISON:  None. FINDINGS: Generous heart size accentuated by portable technique. There is no edema, consolidation, effusion, or pneumothorax. Artifact from EKG leads. IMPRESSION: Borderline heart size. Clear lungs. Electronically Signed   By: Monte Fantasia M.D.   On: 04/14/2020 13:23    Scheduled Meds: . apixaban  5 mg Oral BID  . diltiazem  120 mg Oral Daily  . lisinopril  20 mg Oral Daily  . metoprolol  200 mg Oral Daily  . sodium chloride flush  3 mL Intravenous Once  . sodium chloride flush  3 mL Intravenous Q12H    Continuous Infusions: .  ceFAZolin (ANCEF) IV Stopped (04/15/20 1055)     LOS: 0 days     Alma Friendly, MD [facility]  If 7PM-7AM, please contact night-coverage [email or web address] 04/15/2020, 12:38  PM

## 2020-04-16 ENCOUNTER — Encounter (HOSPITAL_COMMUNITY): Payer: Self-pay | Admitting: Internal Medicine

## 2020-04-16 LAB — BASIC METABOLIC PANEL
Anion gap: 9 (ref 5–15)
BUN: 15 mg/dL (ref 6–20)
CO2: 26 mmol/L (ref 22–32)
Calcium: 8 mg/dL — ABNORMAL LOW (ref 8.9–10.3)
Chloride: 101 mmol/L (ref 98–111)
Creatinine, Ser: 1.34 mg/dL — ABNORMAL HIGH (ref 0.61–1.24)
GFR calc Af Amer: >60 mL/min (ref >60)
GFR calc non Af Amer: >60 mL/min (ref >60)
Glucose, Bld: 104 mg/dL — ABNORMAL HIGH (ref 70–99)
Potassium: 3.7 mmol/L (ref 3.5–5.1)
Sodium: 136 mmol/L (ref 135–145)

## 2020-04-16 LAB — CBC WITH DIFFERENTIAL/PLATELET
Abs Immature Granulocytes: 0.02 10*3/uL (ref 0.00–0.07)
Basophils Absolute: 0 10*3/uL (ref 0.0–0.1)
Basophils Relative: 0 %
Eosinophils Absolute: 0 10*3/uL (ref 0.0–0.5)
Eosinophils Relative: 1 %
HCT: 38.5 % — ABNORMAL LOW (ref 39.0–52.0)
Hemoglobin: 12.6 g/dL — ABNORMAL LOW (ref 13.0–17.0)
Immature Granulocytes: 1 %
Lymphocytes Relative: 11 %
Lymphs Abs: 0.5 10*3/uL — ABNORMAL LOW (ref 0.7–4.0)
MCH: 29.2 pg (ref 26.0–34.0)
MCHC: 32.7 g/dL (ref 30.0–36.0)
MCV: 89.3 fL (ref 80.0–100.0)
Monocytes Absolute: 0.5 10*3/uL (ref 0.1–1.0)
Monocytes Relative: 12 %
Neutro Abs: 3.2 10*3/uL (ref 1.7–7.7)
Neutrophils Relative %: 75 %
Platelets: 107 10*3/uL — ABNORMAL LOW (ref 150–400)
RBC: 4.31 MIL/uL (ref 4.22–5.81)
RDW: 13.1 % (ref 11.5–15.5)
WBC: 4.2 10*3/uL (ref 4.0–10.5)
nRBC: 0 % (ref 0.0–0.2)

## 2020-04-16 LAB — GLUCOSE, CAPILLARY
Glucose-Capillary: 99 mg/dL (ref 70–99)
Glucose-Capillary: 97 mg/dL (ref 70–99)
Glucose-Capillary: 108 mg/dL — ABNORMAL HIGH (ref 70–99)

## 2020-04-16 LAB — PROCALCITONIN: Procalcitonin: 1.45 ng/mL

## 2020-04-16 LAB — URINE CULTURE

## 2020-04-16 MED ORDER — LEVALBUTEROL HCL 0.63 MG/3ML IN NEBU: 0.6300 mg | INHALATION_SOLUTION | Freq: Four times a day (QID) | RESPIRATORY_TRACT | Status: DC | PRN

## 2020-04-16 MED ORDER — CEPHALEXIN 500 MG PO CAPS: 1000.0000 mg | ORAL_CAPSULE | Freq: Two times a day (BID) | ORAL | 0 refills | Status: AC

## 2020-04-16 NOTE — Progress Notes (Signed)
Patient was discharged home by MD order; discharged instructions  reviewed and given to patient with care notes; IV DIC; skin intact; patient will be escorted to the car by nurse tech via wheelchair.  

## 2020-04-16 NOTE — Discharge Summary (Signed)
Discharge Summary  Dakota Hernandez EGB:[id number] DOB: 12/26/1965  PCP: Cassandria Anger, MD  Admit date: 04/14/2020 Discharge date: 04/16/2020  Time spent: 40 mins  Recommendations for Outpatient Follow-up:  1. PCP in 1 week 2. Pulmonology as scheduled 3. Cardiology as scheduled   Discharge Diagnoses:  Active Hospital Problems   Diagnosis Date Noted  . Febrile illness 04/14/2020  . Dyspnea 04/14/2020  . Congestive dilated cardiomyopathy ([place]) 02/14/2013  . Permanent atrial fibrillation with RVR ([place]) 07/15/2012  . Diabetes ([facility] [place] [facility]) 07/15/2012  . OSA (obstructive sleep apnea) 03/13/2011  . Essential hypertension 02/08/2008    Resolved Hospital Problems  No resolved problems to display.    Discharge Condition: Stable  Diet recommendation: Heart healthy/mod carb  Vitals:   04/16/20 0859 04/16/20 1208  BP: (!) 139/103 129/90  Pulse: 71 96  Resp:  20  Temp:  98.3 F (36.8 C)  SpO2: 95% 95%    History of present illness:  Dakota Rumpf Haithcoxis a 54 y.o.malewith medical history significant forpermanent atrial fibrillation on Eliquis, cardiomyopathy (suspected tachycardia mediated with most recent EF 55-60%), diet-controlled type 2 diabetes, hypertension, and OSA who presents to the ED for evaluation of shortness of breath and subjective fever. Patient states around morning of 04/14/2020 he awoke with subjective fever, chills and SOB. Reports chronic palpitation and chronic swelling in both of his lower extremities. Pt has multiple excoriations on his lower legs from scratching as well as scratch marks (from his cat) on his BUE, but has not seen any open or obviously infected wounds on his body. He denies any obvious bug/tick bites. He denies any sick contacts. He denies any recent changes in his medications. In the ED, VS showed BP 160/107, pulse 83, RR 24, temp 100.4 Fahrenheit. T-max has been 102 Fahrenheit while in the ED. Labs are notable for WBC 11.5, BNP 310.3,  ABG showed pH 7.396, PCO2 39.1, PO2 95. Urinalysis is negative for UTI. Blood and urine cultures were obtained and pending. POC SARS-CoV-2 antigen and SARS-CoV-2 PCR were both negative. Portable chest x-ray and CTA chest are both unremarkable.Due to fever, A. fib with RVR, continued dyspnea the hospitalist service was consulted to admit for further evaluation management.    Today, patient denies any new complaints, denies any fever/chills, worsening shortness of breath, chest pain, abdominal pain, nausea/vomiting, diarrhea.  Patient eager to be discharged.  Patient advised to follow-up with PCP, pulmonology and cardiology as scheduled.   Hospital Course:  Principal Problem:   Febrile illness Active Problems:   Essential hypertension   OSA (obstructive sleep apnea)   Diabetes (HCC)   Permanent atrial fibrillation with RVR (HCC)   Congestive dilated cardiomyopathy (HCC)   Dyspnea   Sepsis, unknown etiology/??possibly BLE cellulitis Fever, tachypnea, tachycardic Currently no source of infection, except for chronic wounds noted  on BLE with cat scratch marks on BUE Currently afebrile, with no leukocytosis Procalcitonin 1.36-->1.45 CRP and ESR elevated UA negative, UC multiple species BC x2 no growth till date Chest x-ray unremarkable CTA chest unremarkable S/p empiric Ancef--> will switch to p.o. Keflex for total of 7 days of antibiotics Follow-up with PCP  AKI Improving Creatinine bumped to 1.56 on 04/15/2020--> 1.34 Follow-up with PCP with repeat labs  Acute hypoxic respiratory failure likely 2/2 acute on chronic diastolic HF Vs OHS Vs OSA On admission, required about 3 L O2, non at baseline Saturating well on room air BNP 310 ABG unremarkable Troponin unremarkable, EKG showed A. fib with RVR Echo done 03/07/2020 showed  EF of 55 to 60%, unable to evaluate diastolic function Chest x-ray unremarkable We will hold off on further diuresis for now due to AKI Follow-up with  PCP, cardiology, pulmonology as scheduled Recommend CPAP at bedtime and during naps  Permanent A. fib with RVR Rate better controlled Continue diltiazem, Toprol Eliquis for anticoagulation  Hypertension BP stable Continue diltiazem, Toprol, lisinopril  Diabetes mellitus type 2 A1c 5.7 Continue diet modification  OSA CPAP during naps and at bedtime Patient has an appointment scheduled with pulmonology on 04/17/2020  Morbid obesity Lifestyle modification advised        Malnutrition Type:      Malnutrition Characteristics:      Nutrition Interventions:      Estimated body mass index is 53.43 kg/m as calculated from the following:   Height as of this encounter: '5\' 10"'  (1.778 m).   Weight as of this encounter: 168.9 kg.    Procedures:  None  Consultations:  None  Discharge Exam: BP 129/90 (BP Location: Left Arm)   Pulse 96   Temp 98.3 F (36.8 C) (Oral)   Resp 20   Ht '5\' 10"'  (1.778 m)   Wt (!) 168.9 kg   SpO2 95%   BMI 53.43 kg/m   General: NAD Cardiovascular: S1, S2 present Respiratory: CTA B     Discharge Instructions You were cared for by a hospitalist during your hospital stay. If you have any questions about your discharge medications or the care you received while you were in the hospital after you are discharged, you can call the unit and asked to speak with the hospitalist on call if the hospitalist that took care of you is not available. Once you are discharged, your primary care physician will handle any further medical issues. Please note that NO REFILLS for any discharge medications will be authorized once you are discharged, as it is imperative that you return to your primary care physician (or establish a relationship with a primary care physician if you do not have one) for your aftercare needs so that they can reassess your need for medications and monitor your lab values.  Discharge Instructions    Diet - low sodium heart  healthy   Complete by: As directed    Discharge wound care:   Complete by: As directed    Regular wound care   Increase activity slowly   Complete by: As directed      Allergies as of 04/16/2020      Reactions   Hydrochlorothiazide Other (See Comments)   Reaction not recalled by the patient      Medication List    TAKE these medications   apixaban 5 MG Tabs tablet Commonly known as: ELIQUIS Take 1 tablet (5 mg total) by mouth 2 (two) times daily.   cephALEXin 500 MG capsule Commonly known as: Keflex Take 2 capsules (1,000 mg total) by mouth 2 (two) times daily for 5 days.   diltiazem 120 MG 24 hr capsule Commonly known as: CARDIZEM CD Take 1 capsule (120 mg total) by mouth daily.   lisinopril 20 MG tablet Commonly known as: ZESTRIL TAKE 1 TABLET BY MOUTH  DAILY   metoprolol 200 MG 24 hr tablet Commonly known as: TOPROL-XL TAKE 1 TABLET BY MOUTH  DAILY   omeprazole 20 MG tablet Commonly known as: PRILOSEC OTC Take 20 mg by mouth daily before breakfast.   Vitamin D3 25 MCG (1000 UT) Caps Take 1 capsule by mouth daily.  Discharge Care Instructions  (From admission, onward)         Start     Ordered   04/16/20 0000  Discharge wound care:       Comments: Regular wound care   04/16/20 1334         Allergies  Allergen Reactions  . Hydrochlorothiazide Other (See Comments)    Reaction not recalled by the patient    Follow-up Information    Plotnikov, Evie Lacks, MD. Schedule an appointment as soon as possible for a visit in 1 week(s).   Specialty: Internal Medicine Contact information: [place] [address] [phone]        Lelon Perla, MD .   Specialty: Cardiology Contact information: [address] [place] [address] [phone]                The results of significant diagnostics from this hospitalization (including imaging, microbiology, ancillary and laboratory) are listed  below for reference.    Significant Diagnostic Studies: CT Angio Chest PE W and/or Wo Contrast  Result Date: 04/14/2020 CLINICAL DATA:  54 year old male with acute cough and shortness of breath. EXAM: CT ANGIOGRAPHY CHEST WITH CONTRAST TECHNIQUE: Multidetector CT imaging of the chest was performed using the standard protocol during bolus administration of intravenous contrast. Multiplanar CT image reconstructions and MIPs were obtained to evaluate the vascular anatomy. CONTRAST:  13m OMNIPAQUE IOHEXOL 350 MG/ML SOLN COMPARISON:  None. FINDINGS: Cardiovascular: This is a technically borderline study given respiratory motion artifact and less than optimal contrast opacification within the LOWER lungs. No definite pulmonary emboli are identified. No large or central pulmonary emboli are identified. Cardiomegaly is identified. There is no evidence of thoracic aortic aneurysm or dissection. No pericardial effusion. Mediastinum/Nodes: No enlarged mediastinal, hilar, or axillary lymph nodes. Thyroid gland, trachea, and esophagus demonstrate no significant findings. Lungs/Pleura: Lungs are clear. There is no evidence of airspace disease, consolidation, mass, definite nodule, pleural effusion or pneumothorax. Upper Abdomen: No acute abnormality. Gastric surgical changes are present. Musculoskeletal: No acute or suspicious bony abnormalities are noted Review of the MIP images confirms the above findings. IMPRESSION: 1. No acute abnormality. 2. Technically borderline study without definite pulmonary emboli. 3. Cardiomegaly. Electronically Signed   By: JMargarette CanadaM.D.   On: 04/14/2020 15:39   DG Chest Portable 1 View  Result Date: 04/14/2020 CLINICAL DATA:  Shortness of breath EXAM: PORTABLE CHEST 1 VIEW COMPARISON:  None. FINDINGS: Generous heart size accentuated by portable technique. There is no edema, consolidation, effusion, or pneumothorax. Artifact from EKG leads. IMPRESSION: Borderline heart size. Clear  lungs. Electronically Signed   By: JMonte FantasiaM.D.   On: 04/14/2020 13:23    Microbiology: Recent Results (from the past 240 hour(s))  SARS Coronavirus 2 by RT PCR (hospital order[facility]) Nasopharyngeal Nasopharyngeal Swab     Status: None   Collection Time: 04/14/20  3:34 PM   Specimen: Nasopharyngeal Swab  Result Value Ref Range Status   SARS Coronavirus 2 NEGATIVE NEGATIVE Final    Comment: (NOTE) SARS-CoV-2 target nucleic acids are NOT DETECTED.  The SARS-CoV-2 RNA is generally detectable in upper and lower respiratory specimens during the acute phase of infection. The lowest concentration of SARS-CoV-2 viral copies this assay can detect is 250 copies / mL. A negative result does not preclude SARS-CoV-2 infection and should not be used as the sole basis for treatment or other patient management decisions.  A negative result  may occur with improper specimen collection / handling, submission of specimen other than nasopharyngeal swab, presence of viral mutation(s) within the areas targeted by this assay, and inadequate number of viral copies (<250 copies / mL). A negative result must be combined with clinical observations, patient history, and epidemiological information.  Fact Sheet for Patients:   StrictlyIdeas.no  Fact Sheet for Healthcare Providers: BankingDealers.co.za  This test is not yet approved or  cleared by the [place] FDA and has been authorized for detection and/or diagnosis of SARS-CoV-2 by FDA under an Emergency Use Authorization (EUA).  This EUA will remain in effect (meaning this test can be used) for the duration of the COVID-19 declaration under Section 564(b)(1) of the Act, 21 U.S.C. section 360bbb-3(b)(1), unless the authorization is terminated or revoked sooner.  Performed at [facility], [place] [address], [place], [place] [address]   Culture, blood (routine  x 2)     Status: None (Preliminary result)   Collection Time: 04/14/20  4:06 PM   Specimen: BLOOD  Result Value Ref Range Status   Specimen Description BLOOD RIGHT ANTECUBITAL  Final   Special Requests   Final    BOTTLES DRAWN AEROBIC AND ANAEROBIC Blood Culture results may not be optimal due to an inadequate volume of blood received in culture bottles   Culture   Final    NO GROWTH 2 DAYS Performed at [facility], [place] [address] [place], [place] [address]    Report Status PENDING  Incomplete  Urine culture     Status: Abnormal   Collection Time: 04/14/20  4:07 PM   Specimen: In/Out Cath Urine  Result Value Ref Range Status   Specimen Description IN/OUT CATH URINE  Final   Special Requests   Final    NONE Performed at [facility], [place] [address] [place], [place] [address]    Culture MULTIPLE SPECIES PRESENT, SUGGEST RECOLLECTION (A)  Final   Report Status 04/16/2020 FINAL  Final  Culture, blood (routine x 2)     Status: None (Preliminary result)   Collection Time: 04/14/20  4:11 PM   Specimen: BLOOD  Result Value Ref Range Status   Specimen Description BLOOD LEFT ANTECUBITAL  Final   Special Requests   Final    BOTTLES DRAWN AEROBIC AND ANAEROBIC Blood Culture results may not be optimal due to an inadequate volume of blood received in culture bottles   Culture   Final    NO GROWTH 2 DAYS Performed at [facility], [place] [address], [place], [place] [address]    Report Status PENDING  Incomplete     Labs: Basic Metabolic Panel: Recent Labs  Lab 04/14/20 0812 04/14/20 1732 04/15/20 0552 04/16/20 0457  NA 138 139 135 136  K 4.1 3.8 3.9 3.7  CL 104  --  103 101  CO2 23  --  20* 26  GLUCOSE 125*  --  120* 104*  BUN 14  --  18 15  CREATININE 1.15  --  1.56* 1.34*  CALCIUM 8.6*  --  8.0* 8.0*   Liver Function Tests: Recent Labs  Lab 04/14/20 0812  AST 22  ALT 19  ALKPHOS 67  BILITOT 1.1  PROT 7.2  ALBUMIN 3.6   No results for input(s):  LIPASE, AMYLASE in the last 168 hours. No results for input(s): AMMONIA in the last 168 hours. CBC: Recent Labs  Lab 04/14/20 0812 04/14/20 1732 04/15/20 0552 04/16/20 0457  WBC 11.5*  --  8.5 4.2  NEUTROABS  --   --   --  3.2  HGB 14.9 13.3 14.0 12.6*  HCT 45.9 39.0 41.7 38.5*  MCV 88.6  --  88.7 89.3  PLT 157  --  136* 107*   Cardiac Enzymes: No results for input(s): CKTOTAL, CKMB, CKMBINDEX, TROPONINI in the last 168 hours. BNP: BNP (last 3 results) Recent Labs    04/14/20 0812  BNP 310.3*    ProBNP (last 3 results) No results for input(s): PROBNP in the last 8760 hours.  CBG: Recent Labs  Lab 04/14/20 0817 04/15/20 1648 04/15/20 2146 04/16/20 0628 04/16/20 1206  GLUCAP 133* 86 108* 99 97       Signed:  Alma Friendly, MD [facility] 04/16/2020, 1:38 PM

## 2020-04-16 NOTE — Evaluation (Signed)
Physical Therapy Evaluation Patient Details Name: Dakota Hernandez MRN: [id number] DOB: Nov 08, 1965 Today's Date: 04/16/2020   History of Present Illness  Dakota Hernandez is a 54 y.o. male with medical history significant for permanent atrial fibrillation on Eliquis, cardiomyopathy (suspected tachycardia mediated with most recent EF 55-60%), diet-controlled type 2 diabetes, hypertension, and OSA who presents to the ED for evaluation of shortness of breath.  Clinical Impression  Pt admitted with above diagnosis. Pt able to ambulate with supervision without device with good balance.  Pt is limited by DOE 3/4 and needed cues to pursed lip breathing after walking 350 feet as he desaturated to 87%.  This did improve once pt was aware and he slowed his pace and was able to perform pursed lip breathing. Discussed with pt energy conservation techniques as well as to have discussion with his job about his limitations and working back up to what he did at work prior to this illness. Pt appears to be aware of how to compensate currently.  Will follow while in hospital.   Pt currently with functional limitations due to the deficits listed below (see PT Problem List). Pt will benefit from skilled PT to increase their independence and safety with mobility to allow discharge to the venue listed below.      Follow Up Recommendations No PT follow up    Equipment Recommendations  None recommended by PT    Recommendations for Other Services       Precautions / Restrictions Precautions Precautions: None Restrictions Weight Bearing Restrictions: No      Mobility  Bed Mobility Overal bed mobility: Independent                Transfers Overall transfer level: Independent                  Ambulation/Gait Ambulation/Gait assistance: Independent Gait Distance (Feet): 650 Feet Assistive device: None Gait Pattern/deviations: Step-through pattern;Decreased stride length   Gait velocity  interpretation: 1.31 - 2.62 ft/sec, indicative of limited community ambulator General Gait Details: Pt has no difficulties with gait or balance and is safe without device.  Pt ambulated on RA but did desat to 87% after walking about 300 feet.  Encouraged pursed lip breathing and slower pace and pt did not desaturate again.   Stairs            Wheelchair Mobility    Modified Rankin (Stroke Patients Only)       Balance Overall balance assessment: Independent                               Standardized Balance Assessment Standardized Balance Assessment : Dynamic Gait Index   Dynamic Gait Index Level Surface: Normal Change in Gait Speed: Normal Gait with Horizontal Head Turns: Mild Impairment Gait with Vertical Head Turns: Normal Gait and Pivot Turn: Mild Impairment Step Over Obstacle: Normal Step Around Obstacles: Normal Steps: Mild Impairment Total Score: 21       Pertinent Vitals/Pain Pain Assessment: No/denies pain    Home Living Family/patient expects to be discharged to:: Private residence Living Arrangements: Alone   Type of Home: House Home Access: Stairs to enter Entrance Stairs-Rails: None Entrance Stairs-Number of Steps: 1 Home Layout: One level Home Equipment: None      Prior Function Level of Independence: Independent         Comments: Works at [employer] in [employer] full time, drives  Hand Dominance   Dominant Hand: Left    Extremity/Trunk Assessment   Upper Extremity Assessment Upper Extremity Assessment: Defer to OT evaluation    Lower Extremity Assessment Lower Extremity Assessment: Generalized weakness    Cervical / Trunk Assessment Cervical / Trunk Assessment: Normal  Communication   Communication: No difficulties  Cognition Arousal/Alertness: Awake/alert Behavior During Therapy: WFL for tasks assessed/performed Overall Cognitive Status: Within Functional Limits for tasks assessed                                         General Comments General comments (skin integrity, edema, etc.): 96% RA, 113 bpm, desat on RA to 87% with incr ambulation but improves with slower gait and pursed lip breathing.     Exercises General Exercises - Lower Extremity Ankle Circles/Pumps: AROM;Both;10 reps;Seated Long Arc Quad: AROM;Both;10 reps;Seated   Assessment/Plan    PT Assessment Patient needs continued PT services  PT Problem List Decreased activity tolerance;Decreased balance;Decreased mobility;Decreased knowledge of use of DME;Decreased safety awareness;Decreased knowledge of precautions;Cardiopulmonary status limiting activity       PT Treatment Interventions DME instruction;Gait training;Functional mobility training;Therapeutic activities;Therapeutic exercise;Balance training;Patient/family education    PT Goals (Current goals can be found in the Care Plan section)  Acute Rehab PT Goals Patient Stated Goal: to improve my breathing PT Goal Formulation: With patient Time For Goal Achievement: 04/30/20 Potential to Achieve Goals: Good    Frequency Min 3X/week   Barriers to discharge        Co-evaluation               AM-PAC PT "6 Clicks" Mobility  Outcome Measure Help needed turning from your back to your side while in a flat bed without using bedrails?: None Help needed moving from lying on your back to sitting on the side of a flat bed without using bedrails?: None Help needed moving to and from a bed to a chair (including a wheelchair)?: None Help needed standing up from a chair using your arms (e.g., wheelchair or bedside chair)?: None Help needed to walk in hospital room?: None Help needed climbing 3-5 steps with a railing? : None 6 Click Score: 24    End of Session Equipment Utilized During Treatment: Gait belt Activity Tolerance: Patient limited by fatigue Patient left: in chair;with call bell/phone within reach;with chair alarm set Nurse Communication:  Mobility status PT Visit Diagnosis: Muscle weakness (generalized) (M62.81)    Time: 0109-3235 PT Time Calculation (min) (ACUTE ONLY): 25 min   Charges:   PT Evaluation $PT Eval Moderate Complexity: 1 Mod PT Treatments $Gait Training: 8-22 mins        Jamie-Lee Galdamez W,PT Acute Rehabilitation Services Pager:  [phone]  Office:  [phone]    Berline Lopes 04/16/2020, 11:59 AM

## 2020-04-17 ENCOUNTER — Ambulatory Visit (INDEPENDENT_AMBULATORY_CARE_PROVIDER_SITE_OTHER): Payer: BC Managed Care – PPO | Admitting: Internal Medicine

## 2020-04-17 ENCOUNTER — Other Ambulatory Visit: Payer: Self-pay

## 2020-04-17 ENCOUNTER — Encounter: Payer: Self-pay | Admitting: Internal Medicine

## 2020-04-17 VITALS — BP 140/90 | HR 85 | Temp 97.3°F | Ht 70.0 in | Wt 370.8 lb

## 2020-04-17 DIAGNOSIS — G4733 Obstructive sleep apnea (adult) (pediatric): Secondary | ICD-10-CM

## 2020-04-17 DIAGNOSIS — I509 Heart failure, unspecified: Secondary | ICD-10-CM

## 2020-04-17 DIAGNOSIS — I4821 Permanent atrial fibrillation: Secondary | ICD-10-CM | POA: Diagnosis not present

## 2020-04-17 DIAGNOSIS — I420 Dilated cardiomyopathy: Secondary | ICD-10-CM | POA: Diagnosis not present

## 2020-04-17 NOTE — Assessment & Plan Note (Signed)
Managed by cardiology. Control of OSA may help.

## 2020-04-17 NOTE — Assessment & Plan Note (Signed)
Disappointing that Bariatric surgery 2013 didn't prevent current morbid obesity.  Plan- ongoing encouragement to work on diet and activity.

## 2020-04-17 NOTE — Patient Instructions (Signed)
Order- schedule Split Night Sleep Study at sleep center    Dx OSA, CHF, AFib  Please call us about 2 weeks after your sleep study is done, to see if results and recommendations are ready yet. If appropriate, we may be able tos start treatment before we see you next.

## 2020-04-17 NOTE — Progress Notes (Signed)
04/17/20- 54 YO M never smoker for sleep evaluation courtesy of Dr Jens Som. Medical problem list includes Chronic Venous Insufficiency, Permanent AFib/ Eliquis, HTN, CM/CHF, DM, Morbid Obesity NPSG 02/05/2011:  AHI 38/hr with desat to 74% (Dr Shelle Iron) [facility] 7/11-7/13/21- SOB/ Fever attributed to possible BLE cellulitis, Rx'd Keflex  CTa unremarkable. Body weight today 370 lbs Epworth score 9 He had used CPAP originally, but dropped off due to frequent bathroom trips for nocturia, at same time then-wife dropped her own CPAP. Now admits loud snoring, daytime sleepiness watching TV. Works first/ second Software engineer. No sleep medicines. Sweet tea.  Currently nocturia 2-3 times. Weight gain 20-25 lbs 2 years. Denies ENT surgery  Prior to Admission medications   Medication Sig Start Date End Date Taking? Authorizing Provider  apixaban (ELIQUIS) 5 MG TABS tablet Take 1 tablet (5 mg total) by mouth 2 (two) times daily. 02/01/20  Yes Lewayne Bunting, MD  cephALEXin (KEFLEX) 500 MG capsule Take 2 capsules (1,000 mg total) by mouth 2 (two) times daily for 5 days. 04/16/20 04/21/20 Yes Briant Cedar, MD  Cholecalciferol (VITAMIN D3) 1000 UNITS CAPS Take 1 capsule by mouth daily.     Yes [provider]  diltiazem (CARDIZEM CD) 120 MG 24 hr capsule Take 1 capsule (120 mg total) by mouth daily. 02/01/20 05/01/20 Yes Lewayne Bunting, MD  lisinopril (ZESTRIL) 20 MG tablet TAKE 1 TABLET BY MOUTH  DAILY Patient taking differently: Take 20 mg by mouth daily.  08/11/19  Yes Lewayne Bunting, MD  metoprolol (TOPROL-XL) 200 MG 24 hr tablet TAKE 1 TABLET BY MOUTH  DAILY Patient taking differently: Take 200 mg by mouth daily.  08/11/19  Yes Lewayne Bunting, MD  omeprazole (PRILOSEC OTC) 20 MG tablet Take 20 mg by mouth daily before breakfast.    Yes [provider]   Past Medical History:  Diagnosis Date  . Atrial fibrillation (HCC)    a. s/p multiple DCCV's ---> now rate-controlled.  On Eliquis for anticoagulation  . Cardiomyopathy (HCC)    a. EF previously at 25-30% (Tachycardia-mediated?) --> at 55-60% by echo in 03/2016  . Diabetes mellitus without complication (HCC)   . ED (erectile dysfunction)   . Hypertension   . Hypogonadism male   . Obesity   . OSA (obstructive sleep apnea)    a. on CPAP   Past Surgical History:  Procedure Laterality Date  . APPENDECTOMY  1981  . CARDIOVERSION N/A 03/24/2013   Procedure: CARDIOVERSION;  Surgeon: Lewayne Bunting, MD;  Location: [facility] ENDOSCOPY;  Service: Cardiovascular;  Laterality: N/A;  . EYE SURGERY  in 1st grade   Left eye  . LAPAROSCOPIC GASTRIC SLEEVE RESECTION  8/13   Family History  Problem Relation Age of Onset  . Heart attack Father 77  . Heart disease Father 64  . Heart attack Other        cousin  . Heart disease Other    Social History   Socioeconomic History  . Marital status: Divorced    Spouse name: [name]  . Number of children: N  . Years of education: Not on file  . Highest education level: Not on file  Occupational History  . 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 Smoking status: Never Smoker  . Smokeless tobacco: Never Used  Vaping Use  . Vaping Use: Never used  Substance and Sexual Activity  . Alcohol use: No  . Drug  use: No  . Sexual activity: Yes  Other Topics Concern  . Not on file  Social History Narrative   Got married 08/2010      Regular Exercise- yes         Social Determinants of Health   Financial Resource Strain:   . Difficulty of Paying Living Expenses:   Food Insecurity:   . Worried About Programme researcher, broadcasting/film/video in the Last Year:   . Barista in the Last Year:   Transportation Needs:   . Freight forwarder (Medical):   [name] Lack of Transportation (Non-Medical):   Physical Activity:   . Days of Exercise per Week:   . Minutes of Exercise per Session:   Stress:   . Feeling of Stress :   Social  Connections:   . Frequency of Communication with Friends and Family:   . Frequency of Social Gatherings with Friends and Family:   . Attends Religious Services:   . Active Member of Clubs or Organizations:   . Attends Banker Meetings:   [name] Marital Status:   Intimate Partner Violence:   . Fear of Current or Ex-Partner:   . Emotionally Abused:   [name] Physically Abused:   . Sexually Abused:    ROS-see HPI   + = positive Constitutional:    weight loss, night sweats, fevers, chills, fatigue, lassitude. HEENT:    headaches, difficulty swallowing, tooth/dental problems, sore throat,       sneezing, itching, ear ache, nasal congestion, post nasal drip, snoring CV:    chest pain, orthopnea, PND, +swelling in lower extremities, anasarca,                                  dizziness, +palpitations Resp:   +shortness of breath with exertion or at rest.                productive cough,   non-productive cough, coughing up of blood.              change in color of mucus.  wheezing.   Skin:    rash or lesions. GI:  No-   heartburn, indigestion, abdominal pain, nausea, vomiting, diarrhea,                 change in bowel habits, loss of appetite GU: dysuria, change in color of urine, no urgency or frequency.   flank pain. MS:   joint pain, stiffness, decreased range of motion, back pain. Neuro-     nothing unusual Psych:  change in mood or affect.  depression or anxiety.   memory loss.  OBJ- Physical Exam General- Alert, Oriented, Affect-appropriate, Distress- none acute, + morbidly obese Skin- rash-none, lesions- none, excoriation- none Lymphadenopathy- none Head- atraumatic            Eyes- Gross vision intact, PERRLA, conjunctivae and secretions clear            Ears- Hearing, canals-normal            Nose- Clear, no-Septal dev, mucus, polyps, erosion, perforation             Throat- Mallampati III_IV , mucosa clear , drainage- none, tonsils- atrophic,  + teeth Neck- flexible ,  trachea midline, no stridor , thyroid nl, carotid no bruit Chest - symmetrical excursion , unlabored           Heart/CV- IRR/ AFib , no  murmur , no gallop  , no rub, nl s1 s2                           - JVD+1 , edema+tight 4+, stasis changes- none, varices- none           Lung- clear to P&A, wheeze- none, cough- none , dullness-none, rub- none           Chest wall-  Abd-  Br/ Gen/ Rectal- Not done, not indicated Extrem- cyanosis- none, clubbing, none, atrophy- none, strength- nl Neuro- grossly intact to observation

## 2020-04-17 NOTE — Assessment & Plan Note (Signed)
Has been off CPAP for years, but high probabilty for severe OSA, complicated by cardiomyopathy and atrial fib. Plan- Split protocol sleep study, then anticipate resuming CPAP

## 2020-04-19 LAB — CULTURE, BLOOD (ROUTINE X 2)
Culture: NO GROWTH
Culture: NO GROWTH

## 2020-04-27 NOTE — Progress Notes (Signed)
HPI: FU atrial fibrillation.  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  He has had previous cardioversions but did not hold sinus rhythm.  Pt is now treated with rate control and anticoagulation as he is asymptomatic. CTA 7/21 showed no pulmonary embolus. Last echocardiogram June 2021 showed normal LV function, moderate left atrial enlargement, mild RAE, moderate right ventricular enlargement, mild to moderate MR. Recently DCed following admission for possible cellulitis and respiratory failure. Since last seen, patient denies dyspnea, chest pain, palpitations or syncope.  He does have mild pedal edema.  Current Outpatient Medications  Medication Sig Dispense Refill   lisinopril (ZESTRIL) 20 MG tablet TAKE 1 TABLET BY MOUTH  DAILY (Patient taking differently: Take 20 mg by mouth daily. ) 90 tablet 3   metoprolol (TOPROL-XL) 200 MG 24 hr tablet TAKE 1 TABLET BY MOUTH  DAILY (Patient taking differently: Take 200 mg by mouth daily. ) 90 tablet 3   omeprazole (PRILOSEC OTC) 20 MG tablet Take 20 mg by mouth daily before breakfast.      apixaban (ELIQUIS) 5 MG TABS tablet Take 1 tablet (5 mg total) by mouth 2 (two) times daily. (Patient not taking: Reported on 05/03/2020) 60 tablet 6   diltiazem (CARDIZEM CD) 120 MG 24 hr capsule Take 1 capsule (120 mg total) by mouth daily. 90 capsule 3   No current facility-administered medications for this visit.     Past Medical History:  Diagnosis Date   Atrial fibrillation (HCC)    a. s/p multiple DCCV's ---> now rate-controlled. On Eliquis for anticoagulation   Cardiomyopathy (HCC)    a. EF previously at 25-30% (Tachycardia-mediated?) --> at 55-60% by echo in 03/2016   Diabetes mellitus without complication [facility])    ED (erectile dysfunction)    Hypertension    Hypogonadism male    Obesity    OSA (obstructive sleep apnea)    a. on CPAP    Past Surgical History:    Procedure Laterality Date   APPENDECTOMY  1981   CARDIOVERSION N/A 03/24/2013   Procedure: CARDIOVERSION;  Surgeon: Lewayne Bunting, MD;  Location[facility] ENDOSCOPY;  Service: Cardiovascular;  Laterality: N/A;   EYE SURGERY  in 1st grade   Left eye   LAPAROSCOPIC GASTRIC SLEEVE RESECTION  8/13    Social History   Socioeconomic History   Marital status: Divorced    Spouse name: Science writer   Number of children: N   Years of education: Not on file   Highest education level: Not on file  Occupational History   Occupation: Customer service manager: [employer]    Employer: [employer]  Tobacco Use   Smoking status: Never Smoker   Smokeless tobacco: Never Used  Building services engineer Use: Never used  Substance and Sexual Activity   Alcohol use: No   Drug use: No   Sexual activity: Yes  Other Topics Concern   Not on file  Social History Narrative   Got married 08/2010      Regular Exercise- yes         Social Determinants of Health   Financial Resource Strain:    Difficulty of Paying Living Expenses:   Food Insecurity:    Worried About Programme researcher, broadcasting/film/video in the Last Year:    Barista in the Last Year:   Transportation Needs:    Freight forwarder (Medical):    Lack of Transportation (Non-Medical):  Physical Activity:    Days of Exercise per Week:    Minutes of Exercise per Session:   Stress:    Feeling of Stress :   Social Connections:    Frequency of Communication with Friends and Family:    Frequency of Social Gatherings with Friends and Family:    Attends Religious Services:    Active Member of Clubs or Organizations:    Attends Engineer, structural:    Marital Status:   Intimate Partner Violence:    Fear of Current or Ex-Partner:    Emotionally Abused:    Physically Abused:    Sexually Abused:     Family History  Problem Relation Age of Onset   Heart attack Father 15   Heart disease Father 101    Heart attack Other        cousin   Heart disease Other     ROS: no fevers or chills, productive cough, hemoptysis, dysphasia, odynophagia, melena, hematochezia, dysuria, hematuria, rash, seizure activity, orthopnea, PND, claudication. Remaining systems are negative.  Physical Exam: Well-developed obese in no acute distress.  Skin is warm and dry.  HEENT is normal.  Neck is supple.  Chest is clear to auscultation with normal expansion.  Cardiovascular exam is irreglar Abdominal exam nontender or distended. No masses palpated. Extremities show 1+ edema. neuro grossly intact  A/P  1 permanent atrial fibrillation-continue Toprol and Cardizem for rate control.  Continue apixaban.  2 cardiomyopathy-LV function improved on most recent echocardiogram. Previous reduction in LV function felt secondary to tachycardia. Continue ACE inhibitor and beta-blocker.  3 hypertension-blood pressure  elevated; I have asked him to follow this and we will increase medications as needed.  4 obstructive sleep apnea-FU pulmonary.  5 morbid obesity-we discussed the importance of diet, exercise and weight loss.  6 lower extremity edema-likely from venous insufficiency and right heart failure.  He developed cellulitis with recent edema.  I will add Lasix 20 mg daily.  Check potassium and renal function in 1 week.  Olga Millers, MD

## 2020-04-29 ENCOUNTER — Encounter: Payer: Self-pay | Admitting: Internal Medicine

## 2020-04-29 ENCOUNTER — Ambulatory Visit (INDEPENDENT_AMBULATORY_CARE_PROVIDER_SITE_OTHER): Payer: BC Managed Care – PPO | Admitting: Internal Medicine

## 2020-04-29 ENCOUNTER — Other Ambulatory Visit: Payer: Self-pay

## 2020-04-29 DIAGNOSIS — E1122 Type 2 diabetes mellitus with diabetic chronic kidney disease: Secondary | ICD-10-CM | POA: Diagnosis not present

## 2020-04-29 DIAGNOSIS — E538 Deficiency of other specified B group vitamins: Secondary | ICD-10-CM | POA: Diagnosis not present

## 2020-04-29 DIAGNOSIS — Z6841 Body Mass Index (BMI) 40.0 and over, adult: Secondary | ICD-10-CM

## 2020-04-29 LAB — COMPLETE METABOLIC PANEL WITH GFR
AG Ratio: 1.3 (calc) (ref 1.0–2.5)
ALT: 19 U/L (ref 9–46)
AST: 20 U/L (ref 10–35)
Albumin: 3.9 g/dL (ref 3.6–5.1)
Alkaline phosphatase (APISO): 66 U/L (ref 35–144)
BUN: 18 mg/dL (ref 7–25)
CO2: 24 mmol/L (ref 20–32)
Calcium: 8.9 mg/dL (ref 8.6–10.3)
Chloride: 109 mmol/L (ref 98–110)
Creat: 1.08 mg/dL (ref 0.70–1.33)
GFR, Est African American: 91 mL/min/{1.73_m2} (ref >=60)
GFR, Est Non African American: 78 mL/min/{1.73_m2} (ref >=60)
Globulin: 2.9 g/dL (calc) (ref 1.9–3.7)
Glucose, Bld: 95 mg/dL (ref 65–99)
Potassium: 4.4 mmol/L (ref 3.5–5.3)
Sodium: 142 mmol/L (ref 135–146)
Total Bilirubin: 1 mg/dL (ref 0.2–1.2)
Total Protein: 6.8 g/dL (ref 6.1–8.1)

## 2020-04-29 LAB — VITAMIN B12: Vitamin B-12: 265 pg/mL (ref 200–1100)

## 2020-04-29 LAB — VITAMIN D 25 HYDROXY (VIT D DEFICIENCY, FRACTURES): Vit D, 25-Hydroxy: 14 ng/mL — ABNORMAL LOW (ref 30–100)

## 2020-04-29 LAB — TSH: TSH: 0.88 mIU/L (ref 0.40–4.50)

## 2020-04-29 LAB — T3, FREE: T3, Free: 4 pg/mL (ref 2.3–4.2)

## 2020-04-29 NOTE — Patient Instructions (Signed)

## 2020-04-29 NOTE — Assessment & Plan Note (Signed)
Wt Readings from Last 3 Encounters:  04/29/20 (!) 364 lb (165.1 kg)  04/17/20 (!) 370 lb 12.8 oz (168.2 kg)  04/16/20 (!) 372 lb 6.4 oz (168.9 kg)

## 2020-04-29 NOTE — Progress Notes (Signed)
Subjective:  Patient ID: Dakota Hernandez, male    DOB: 12-24-1965  Age: 54 y.o. MRN: [id number]  CC: No chief complaint on file.   HPI Dakota Hernandez presents for post-hosp f/u - sepsis, DM, ARF, cardiomyopathy Not seen in >3 y  Per hx: "Admit date: 04/14/2020 Discharge date: 04/16/2020  Time spent: 40 mins  Recommendations for Outpatient Follow-up:  1. PCP in 1 week 2. Pulmonology as scheduled 3. Cardiology as scheduled   Discharge Diagnoses:      Active Hospital Problems   Diagnosis Date Noted  . Febrile illness 04/14/2020  . Dyspnea 04/14/2020  . Congestive dilated cardiomyopathy ([place]) 02/14/2013  . Permanent atrial fibrillation with RVR ([place]) 07/15/2012  . Diabetes ([place]) 07/15/2012  . OSA (obstructive sleep apnea) 03/13/2011  . Essential hypertension 02/08/2008    Resolved Hospital Problems  No resolved problems to display.    Discharge Condition: Stable  Diet recommendation: Heart healthy/mod carb  Vitals:   04/16/20 0859 04/16/20 1208  BP: (!) 139/103 129/90  Pulse: 71 96  Resp:  20  Temp:  98.3 F (36.8 C)  SpO2: 95% 95%    History of present illness:  Dakota Dunavant Haithcoxis a 54 y.o.malewith medical history significant forpermanent atrial fibrillation on Eliquis, cardiomyopathy (suspected tachycardia mediated with most recent EF 55-60%), diet-controlled type 2 diabetes, hypertension, and OSA who presents to the ED for evaluation of shortness of breathand subjective fever.Patient states around morning of 04/14/2020 he awoke with subjective fever, chills and SOB. Reports chronic palpitation and chronicswelling in both of his lower extremities. Pthas multiple excoriations on his lower legs from scratchingas well as scratch marks (from his cat) on his BUE,but has not seen any open or obviously infected wounds on his body. He denies any obvious bug/tick bites. He denies any sick contacts. He denies any recent changes in his  medications.In the ED, VS showed BP 160/107, pulse 83, RR 24, temp 100.4 Fahrenheit. T-max has been 102 Fahrenheit while in the ED. Labs are notable for WBC 11.5, BNP 310.3, ABG showed pH 7.396, PCO2 39.1, PO2 95. Urinalysis is negative for UTI. Blood and urine cultures were obtained and pending. POC SARS-CoV-2 antigen and SARS-CoV-2 PCR were both negative. Portable chest x-rayand CTA chestare bothunremarkable.Due to fever, A. fib withRVR,continued dyspnea the hospitalist service was consulted to admit for further evaluation management.    Today, patient denies any new complaints, denies any fever/chills, worsening shortness of breath, chest pain, abdominal pain, nausea/vomiting, diarrhea.  Patient eager to be discharged.  Patient advised to follow-up with PCP, pulmonology and cardiology as scheduled.   Hospital Course:  Principal Problem:   Febrile illness Active Problems:   Essential hypertension   OSA (obstructive sleep apnea)   Diabetes (HCC)   Permanent atrial fibrillation with RVR (HCC)   Congestive dilated cardiomyopathy (HCC)   Dyspnea   Sepsis,unknown etiology/??possibly BLE cellulitis Fever,tachypnea, tachycardic Currently no source of infection,except for chronic wounds noted  onBLEwithcatscratch markson BUE Currently afebrile, with no leukocytosis Procalcitonin 1.36-->1.45 CRPandESR elevated UA negative,UC multiple species BC x2 no growth till date Chest x-ray unremarkable CTA chest unremarkable S/p empiric Ancef--> will switch to p.o. Keflex for total of 7 days of antibiotics Follow-up with PCP  AKI Improving Creatinine bumped to 1.56 on 04/15/2020--> 1.34 Follow-up with PCP with repeat labs  Acute hypoxic respiratory failure likely 2/2 acute on chronic diastolic HFVs OHS Vs OSA On admission, required about 3 L O2,nonat baseline Saturating well on room air BNP 310 ABG unremarkable  Troponin unremarkable, EKG showed A. fib with  RVR Echo done 03/07/2020 showed EF of 55 to 60%, unable to evaluate diastolic function Chest x-ray unremarkable We will hold off on further diuresis for now due to AKI Follow-up with PCP, cardiology, pulmonology as scheduled Recommend CPAP at bedtime and during naps  Permanent A. fib with RVR Rate better controlled Continue diltiazem, Toprol Eliquis for anticoagulation  Hypertension BP stable Continue diltiazem, Toprol, lisinopril  Diabetes mellitus type 2 A1c 5.7 Continue diet modification  OSA CPAP during naps and at bedtime Patient has an appointment scheduled with pulmonology on 04/17/2020  Morbid obesity Lifestyle modification advised"       Outpatient Medications Prior to Visit  Medication Sig Dispense Refill  . apixaban (ELIQUIS) 5 MG TABS tablet Take 1 tablet (5 mg total) by mouth 2 (two) times daily. 60 tablet 6  . Cholecalciferol (VITAMIN D3) 1000 UNITS CAPS Take 1 capsule by mouth daily.      [name] diltiazem (CARDIZEM CD) 120 MG 24 hr capsule Take 1 capsule (120 mg total) by mouth daily. 90 capsule 3  . lisinopril (ZESTRIL) 20 MG tablet TAKE 1 TABLET BY MOUTH  DAILY (Patient taking differently: Take 20 mg by mouth daily. ) 90 tablet 3  . metoprolol (TOPROL-XL) 200 MG 24 hr tablet TAKE 1 TABLET BY MOUTH  DAILY (Patient taking differently: Take 200 mg by mouth daily. ) 90 tablet 3  . omeprazole (PRILOSEC OTC) 20 MG tablet Take 20 mg by mouth daily before breakfast.      No facility-administered medications prior to visit.    ROS: Review of Systems  Objective:  BP (!) 132/76 (BP Location: Left Arm, Patient Position: Sitting, Cuff Size: Large)   Pulse 68   Temp 98.4 F (36.9 C) (Oral)   Ht _0  (1.778 m)   Wt (!) 364 lb (165.1 kg)   SpO2 95%   BMI 52.23 kg/m   BP Readings from Last 3 Encounters:  04/29/20 (!) 132/76  04/17/20 140/90  04/16/20 129/90    Wt Readings from Last 3 Encounters:  04/29/20 (!) 364 lb (165.1 kg)  04/17/20 (!) 370 lb  12.8 oz (168.2 kg)  04/16/20 (!) 372 lb 6.4 oz (168.9 kg)    Physical Exam  Lab Results  Component Value Date   WBC 4.2 04/16/2020   HGB 12.6 (L) 04/16/2020   HCT 38.5 (L) 04/16/2020   PLT 107 (L) 04/16/2020   GLUCOSE 104 (H) 04/16/2020   CHOL 137 06/14/2008   TRIG 69 06/14/2008   HDL 34.5 (L) 06/14/2008   LDLCALC 89 06/14/2008   ALT 19 04/14/2020   AST 22 04/14/2020   NA 136 04/16/2020   K 3.7 04/16/2020   CL 101 04/16/2020   CREATININE 1.34 (H) 04/16/2020   BUN 15 04/16/2020   CO2 26 04/16/2020   TSH 0.692 04/14/2020   INR 1.2 04/14/2020   HGBA1C 5.7 (H) 04/15/2020    CT Angio Chest PE W and/or Wo Contrast  Result Date: 04/14/2020 CLINICAL DATA:  54 year old male with acute cough and shortness of breath. EXAM: CT ANGIOGRAPHY CHEST WITH CONTRAST TECHNIQUE: Multidetector CT imaging of the chest was performed using the standard protocol during bolus administration of intravenous contrast. Multiplanar CT image reconstructions and MIPs were obtained to evaluate the vascular anatomy. CONTRAST:  165m OMNIPAQUE IOHEXOL 350 MG/ML SOLN COMPARISON:  None. FINDINGS: Cardiovascular: This is a technically borderline study given respiratory motion artifact and less than optimal contrast opacification within the LOWER lungs. No  definite pulmonary emboli are identified. No large or central pulmonary emboli are identified. Cardiomegaly is identified. There is no evidence of thoracic aortic aneurysm or dissection. No pericardial effusion. Mediastinum/Nodes: No enlarged mediastinal, hilar, or axillary lymph nodes. Thyroid gland, trachea, and esophagus demonstrate no significant findings. Lungs/Pleura: Lungs are clear. There is no evidence of airspace disease, consolidation, mass, definite nodule, pleural effusion or pneumothorax. Upper Abdomen: No acute abnormality. Gastric surgical changes are present. Musculoskeletal: No acute or suspicious bony abnormalities are noted Review of the MIP images  confirms the above findings. IMPRESSION: 1. No acute abnormality. 2. Technically borderline study without definite pulmonary emboli. 3. Cardiomegaly. Electronically Signed   By: Margarette Canada M.D.   On: 04/14/2020 15:39   DG Chest Portable 1 View  Result Date: 04/14/2020 CLINICAL DATA:  Shortness of breath EXAM: PORTABLE CHEST 1 VIEW COMPARISON:  None. FINDINGS: Generous heart size accentuated by portable technique. There is no edema, consolidation, effusion, or pneumothorax. Artifact from EKG leads. IMPRESSION: Borderline heart size. Clear lungs. Electronically Signed   By: Monte Fantasia M.D.   On: 04/14/2020 13:23    Assessment & Plan:   There are no diagnoses linked to this encounter.   No orders of the defined types were placed in this encounter.    Follow-up: No follow-ups on file.  Walker Kehr, MD

## 2020-04-29 NOTE — Assessment & Plan Note (Signed)
Labs

## 2020-04-29 NOTE — Assessment & Plan Note (Signed)
A1c was ok 

## 2020-04-30 ENCOUNTER — Other Ambulatory Visit: Payer: Self-pay | Admitting: Internal Medicine

## 2020-05-03 ENCOUNTER — Ambulatory Visit (INDEPENDENT_AMBULATORY_CARE_PROVIDER_SITE_OTHER): Payer: BC Managed Care – PPO | Admitting: Cardiology

## 2020-05-03 ENCOUNTER — Other Ambulatory Visit: Payer: Self-pay

## 2020-05-03 ENCOUNTER — Encounter: Payer: Self-pay | Admitting: Cardiology

## 2020-05-03 VITALS — BP 138/100 | HR 98 | Ht 70.0 in | Wt 366.2 lb

## 2020-05-03 DIAGNOSIS — I420 Dilated cardiomyopathy: Secondary | ICD-10-CM | POA: Diagnosis not present

## 2020-05-03 DIAGNOSIS — I10 Essential (primary) hypertension: Secondary | ICD-10-CM

## 2020-05-03 DIAGNOSIS — I4891 Unspecified atrial fibrillation: Secondary | ICD-10-CM

## 2020-05-03 DIAGNOSIS — R601 Generalized edema: Secondary | ICD-10-CM

## 2020-05-03 MED ORDER — FUROSEMIDE 20 MG PO TABS: 20.0000 mg | ORAL_TABLET | Freq: Every day | ORAL | 3 refills | Status: DC

## 2020-05-03 NOTE — Patient Instructions (Signed)
Medication Instructions:   START FUROSEMIDE 20 MG ONCE DAILY  *If you need a refill on your cardiac medications before your next appointment, please call your pharmacy*   Lab Work:  Your physician recommends that you return for lab work AFTER YOU HAVE TAKEN FUROSEMIDE FOR 1 WEEK  If you have labs (blood work) drawn today and your tests are completely normal, you will receive your results only by: [name] MyChart Message (if you have MyChart) OR . A paper copy in the mail If you have any lab test that is abnormal or we need to change your treatment, we will call you to review the results.   Follow-Up: At [facility], you and your health needs are our priority.  As part of our continuing mission to provide you with exceptional heart care, we have created designated Provider Care Teams.  These Care Teams include your primary Cardiologist (physician) and Advanced Practice Providers (APPs -  Physician Assistants and Nurse Practitioners) who all work together to provide you with the care you need, when you need it.  We recommend signing up for the patient portal called "MyChart".  Sign up information is provided on this After Visit Summary.  MyChart is used to connect with patients for Virtual Visits (Telemedicine).  Patients are able to view lab/test results, encounter notes, upcoming appointments, etc.  Non-urgent messages can be sent to your provider as well.   To learn more about what you can do with MyChart, go to [email or web address].    Your next appointment:   6 month(s)  The format for your next appointment:   In Person  Provider:   You may see Olga Millers, MD or one of the following Advanced Practice Providers on your designated Care Team:    Corine Shelter, PA-C  [place], [place]  Edd Fabian, [place]

## 2020-05-06 ENCOUNTER — Other Ambulatory Visit: Payer: Self-pay

## 2020-05-06 ENCOUNTER — Ambulatory Visit (HOSPITAL_BASED_OUTPATIENT_CLINIC_OR_DEPARTMENT_OTHER): Payer: BC Managed Care – PPO | Attending: Internal Medicine | Admitting: Internal Medicine

## 2020-05-06 VITALS — Ht 70.0 in | Wt 366.0 lb

## 2020-05-06 DIAGNOSIS — I4821 Permanent atrial fibrillation: Secondary | ICD-10-CM

## 2020-05-06 DIAGNOSIS — I4891 Unspecified atrial fibrillation: Secondary | ICD-10-CM | POA: Diagnosis not present

## 2020-05-06 DIAGNOSIS — I509 Heart failure, unspecified: Secondary | ICD-10-CM

## 2020-05-06 DIAGNOSIS — G4733 Obstructive sleep apnea (adult) (pediatric): Secondary | ICD-10-CM

## 2020-05-18 DIAGNOSIS — I4821 Permanent atrial fibrillation: Secondary | ICD-10-CM

## 2020-05-18 NOTE — Procedures (Signed)
Patient Name: Jorah, Hua Date: 05/06/2020 Gender: Male D.O.B: [birth year] Age (years): 66 Referring Provider: Baird Lyons MD, ABSM Height (inches): 70 Interpreting Physician: Baird Lyons MD, ABSM Weight (lbs): 366 RPSGT: Zadie Rhine BMI: 53 MRN: [id number] Neck Size: 19.00  CLINICAL INFORMATION Sleep Study Type: Split Night CPAP Indication for sleep study: Obesity Epworth Sleepiness Score: 7  SLEEP STUDY TECHNIQUE As per the AASM Manual for the Scoring of Sleep and Associated Events v2.3 (April 2016) with a hypopnea requiring 4% desaturations.  The channels recorded and monitored were frontal, central and occipital EEG, electrooculogram (EOG), submentalis EMG (chin), nasal and oral airflow, thoracic and abdominal wall motion, anterior tibialis EMG, snore microphone, electrocardiogram, and pulse oximetry. Continuous positive airway pressure (CPAP) was initiated when the patient met split night criteria and was titrated according to treat sleep-disordered breathing.  MEDICATIONS Medications self-administered by patient taken the night of the study : none reported  RESPIRATORY PARAMETERS Diagnostic  Total AHI (/hr): 22.4 RDI (/hr): 26.4 OA Index (/hr): 4.5 CA Index (/hr): 0.8 REM AHI (/hr): 77.1 NREM AHI (/hr): 16.6 Supine AHI (/hr): 9.1 Non-supine AHI (/hr): 28.4 Min O2 Sat (%): 67.0 Mean O2 (%): 92.9 Time below 88% (min): 4.8   Titration  Optimal Pressure (cm): 12 AHI at Optimal Pressure (/hr): 3.7 Min O2 at Optimal Pressure (%): 88.0 Supine % at Optimal (%): 35 Sleep % at Optimal (%): 95   SLEEP ARCHITECTURE The recording time for the entire night was 404.6 minutes.  During a baseline period of 267.9 minutes, the patient slept for 147.5 minutes in REM and nonREM, yielding a sleep efficiency of 55.1%%. Sleep onset after lights out was 28.2 minutes with a REM latency of 177.5 minutes. The patient spent 15.9%% of the night in stage N1 sleep, 74.6%% in stage  N2 sleep, 0.0%% in stage N3 and 9.5% in REM.  During the titration period of 134.0 minutes, the patient slept for 114.9 minutes in REM and nonREM, yielding a sleep efficiency of 85.7%%. Sleep onset after CPAP initiation was 12.6 minutes with a REM latency of 10.0 minutes. The patient spent 2.6%% of the night in stage N1 sleep, 33.8%% in stage N2 sleep, 0.0%% in stage N3 and 63.5% in REM.  CARDIAC DATA The 2 lead EKG demonstrated Atrial Fibrillation. The mean heart rate was 89.0 beats per minute. Other EKG findings include: None.  LEG MOVEMENT DATA The total Periodic Limb Movements of Sleep (PLMS) were 0. The PLMS index was 0.0 .  IMPRESSIONS - Moderate obstructive sleep apnea occurred during the diagnostic portion of the study(AHI = 22.4/hour). An optimal PAP pressure was selected for this patient ( 12 cm of water) - No significant central sleep apnea occurred during the diagnostic portion of the study (CAI = 0.8/hour). - Oxygen desaturation was noted during the diagnostic portion of the study (Min O2 = 67.0%). Mean sat on CPAP 12 was 93.9%. - Snoring was audible during the diagnostic portion of the study. - Atrial Fibrillation with mean HR 89/ min. - Clinically significant periodic limb movements did not occur during sleep.  DIAGNOSIS - Obstructive Sleep Apnea (G47.33)  RECOMMENDATIONS - Trial of CPAP therapy on 12 cm H2O or autopap 5-15. - Patient used a Small size Resmed Nasal Mask Airfit N20 mask and heated humidification. - Be careful with alcohol, sedatives and other CNS depressants that may worsen sleep apnea and disrupt normal sleep architecture. - Sleep hygiene should be reviewed to assess factors that may improve sleep quality. -  Weight management and regular exercise should be initiated or continued.  [Electronically signed] 05/18/2020 02:57 PM  Baird Lyons MD, [place], American Board of Sleep Medicine   NPI: [id number]                          [place], [place] [facility]  ELECTRONICALLY SIGNED ON:  05/18/2020, 2:52 PM [place] PH: [phone]   FX: [phone] [place]

## 2020-06-07 ENCOUNTER — Encounter: Payer: Self-pay | Admitting: *Deleted

## 2020-06-11 ENCOUNTER — Encounter (HOSPITAL_COMMUNITY): Payer: Self-pay

## 2020-06-11 ENCOUNTER — Inpatient Hospital Stay (HOSPITAL_COMMUNITY)
Admission: EM | Admit: 2020-06-11 | Discharge: 2020-06-15 | DRG: 603 | Disposition: A | Discharge status: Home / Self Care | Payer: BC Managed Care – PPO | Attending: Internal Medicine | Admitting: Internal Medicine

## 2020-06-11 DIAGNOSIS — Z20822 Contact with and (suspected) exposure to covid-19: Secondary | ICD-10-CM | POA: Diagnosis present

## 2020-06-11 DIAGNOSIS — L03115 Cellulitis of right lower limb: Secondary | ICD-10-CM | POA: Diagnosis not present

## 2020-06-11 DIAGNOSIS — Z6841 Body Mass Index (BMI) 40.0 and over, adult: Secondary | ICD-10-CM

## 2020-06-11 DIAGNOSIS — Z9884 Bariatric surgery status: Secondary | ICD-10-CM

## 2020-06-11 DIAGNOSIS — Z8249 Family history of ischemic heart disease and other diseases of the circulatory system: Secondary | ICD-10-CM

## 2020-06-11 DIAGNOSIS — I4891 Unspecified atrial fibrillation: Secondary | ICD-10-CM | POA: Diagnosis not present

## 2020-06-11 DIAGNOSIS — L02415 Cutaneous abscess of right lower limb: Secondary | ICD-10-CM | POA: Diagnosis not present

## 2020-06-11 DIAGNOSIS — N1831 Chronic kidney disease, stage 3a: Secondary | ICD-10-CM | POA: Diagnosis present

## 2020-06-11 DIAGNOSIS — E119 Type 2 diabetes mellitus without complications: Secondary | ICD-10-CM

## 2020-06-11 DIAGNOSIS — G4733 Obstructive sleep apnea (adult) (pediatric): Secondary | ICD-10-CM | POA: Diagnosis present

## 2020-06-11 DIAGNOSIS — I130 Hypertensive heart and chronic kidney disease with heart failure and stage 1 through stage 4 chronic kidney disease, or unspecified chronic kidney disease: Secondary | ICD-10-CM | POA: Diagnosis not present

## 2020-06-11 DIAGNOSIS — Z888 Allergy status to other drugs, medicaments and biological substances status: Secondary | ICD-10-CM

## 2020-06-11 DIAGNOSIS — L0390 Cellulitis, unspecified: Secondary | ICD-10-CM | POA: Diagnosis present

## 2020-06-11 DIAGNOSIS — Z8619 Personal history of other infectious and parasitic diseases: Secondary | ICD-10-CM

## 2020-06-11 DIAGNOSIS — E1122 Type 2 diabetes mellitus with diabetic chronic kidney disease: Secondary | ICD-10-CM | POA: Diagnosis present

## 2020-06-11 DIAGNOSIS — I420 Dilated cardiomyopathy: Secondary | ICD-10-CM | POA: Diagnosis not present

## 2020-06-11 DIAGNOSIS — I5032 Chronic diastolic (congestive) heart failure: Secondary | ICD-10-CM | POA: Diagnosis present

## 2020-06-11 DIAGNOSIS — E291 Testicular hypofunction: Secondary | ICD-10-CM | POA: Diagnosis present

## 2020-06-11 DIAGNOSIS — Z79899 Other long term (current) drug therapy: Secondary | ICD-10-CM

## 2020-06-11 DIAGNOSIS — I10 Essential (primary) hypertension: Secondary | ICD-10-CM | POA: Diagnosis present

## 2020-06-11 HISTORY — DX: Cellulitis of right lower limb: L02.415

## 2020-06-11 HISTORY — DX: Cellulitis of right lower limb: L03.115

## 2020-06-11 LAB — COMPREHENSIVE METABOLIC PANEL
ALT: 21 U/L (ref 0–44)
AST: 22 U/L (ref 15–41)
Albumin: 3.3 g/dL — ABNORMAL LOW (ref 3.5–5.0)
Alkaline Phosphatase: 57 U/L (ref 38–126)
Anion gap: 11 (ref 5–15)
BUN: 18 mg/dL (ref 6–20)
CO2: 22 mmol/L (ref 22–32)
Calcium: 8.5 mg/dL — ABNORMAL LOW (ref 8.9–10.3)
Chloride: 103 mmol/L (ref 98–111)
Creatinine, Ser: 1.42 mg/dL — ABNORMAL HIGH (ref 0.61–1.24)
GFR calc Af Amer: >60 mL/min (ref >60)
GFR calc non Af Amer: 56 mL/min — ABNORMAL LOW (ref >60)
Glucose, Bld: 120 mg/dL — ABNORMAL HIGH (ref 70–99)
Potassium: 3.7 mmol/L (ref 3.5–5.1)
Sodium: 136 mmol/L (ref 135–145)
Total Bilirubin: 1.5 mg/dL — ABNORMAL HIGH (ref 0.3–1.2)
Total Protein: 6.3 g/dL — ABNORMAL LOW (ref 6.5–8.1)

## 2020-06-11 LAB — CBC WITH DIFFERENTIAL/PLATELET
Abs Immature Granulocytes: 0.11 10*3/uL — ABNORMAL HIGH (ref 0.00–0.07)
Basophils Absolute: 0.1 10*3/uL (ref 0.0–0.1)
Basophils Relative: 0 %
Eosinophils Absolute: 0.1 10*3/uL (ref 0.0–0.5)
Eosinophils Relative: 1 %
HCT: 42.2 % (ref 39.0–52.0)
Hemoglobin: 14.5 g/dL (ref 13.0–17.0)
Immature Granulocytes: 1 %
Lymphocytes Relative: 3 %
Lymphs Abs: 0.4 10*3/uL — ABNORMAL LOW (ref 0.7–4.0)
MCH: 29.8 pg (ref 26.0–34.0)
MCHC: 34.4 g/dL (ref 30.0–36.0)
MCV: 86.8 fL (ref 80.0–100.0)
Monocytes Absolute: 0.4 10*3/uL (ref 0.1–1.0)
Monocytes Relative: 3 %
Neutro Abs: 12.8 10*3/uL — ABNORMAL HIGH (ref 1.7–7.7)
Neutrophils Relative %: 92 %
Platelets: 143 10*3/uL — ABNORMAL LOW (ref 150–400)
RBC: 4.86 MIL/uL (ref 4.22–5.81)
RDW: 13 % (ref 11.5–15.5)
WBC: 13.9 10*3/uL — ABNORMAL HIGH (ref 4.0–10.5)
nRBC: 0 % (ref 0.0–0.2)

## 2020-06-11 LAB — URINALYSIS, ROUTINE W REFLEX MICROSCOPIC
Bilirubin Urine: NEGATIVE
Glucose, UA: NEGATIVE mg/dL
Hgb urine dipstick: NEGATIVE
Ketones, ur: NEGATIVE mg/dL
Leukocytes,Ua: NEGATIVE
Nitrite: NEGATIVE
Protein, ur: NEGATIVE mg/dL
Specific Gravity, Urine: 1.027 (ref 1.005–1.030)
pH: 5 (ref 5.0–8.0)

## 2020-06-11 LAB — LACTIC ACID, PLASMA
Lactic Acid, Venous: 1.4 mmol/L (ref 0.5–1.9)
Lactic Acid, Venous: 1.3 mmol/L (ref 0.5–1.9)

## 2020-06-11 LAB — SEDIMENTATION RATE: Sed Rate: 28 mm/hr — ABNORMAL HIGH (ref 0–16)

## 2020-06-11 LAB — C-REACTIVE PROTEIN: CRP: 16.4 mg/dL — ABNORMAL HIGH (ref <1.0)

## 2020-06-11 LAB — SARS CORONAVIRUS 2 BY RT PCR (HOSPITAL ORDER, PERFORMED IN ~~LOC~~ HOSPITAL LAB): SARS Coronavirus 2: NEGATIVE

## 2020-06-11 LAB — CBG MONITORING, ED: Glucose-Capillary: 108 mg/dL — ABNORMAL HIGH (ref 70–99)

## 2020-06-11 LAB — PROCALCITONIN: Procalcitonin: 16.36 ng/mL

## 2020-06-11 MED ORDER — VANCOMYCIN HCL 1750 MG/350ML IV SOLN
1750.0000 mg | INTRAVENOUS | Status: DC
  Administered 2020-06-12: 1750 mg via INTRAVENOUS
  Filled 2020-06-11: qty 350

## 2020-06-11 MED ORDER — ACETAMINOPHEN 325 MG PO TABS
650.0000 mg | ORAL_TABLET | Freq: Once | ORAL | Status: AC
  Administered 2020-06-11: 650 mg via ORAL
  Filled 2020-06-11: qty 2

## 2020-06-11 MED ORDER — ACETAMINOPHEN 650 MG RE SUPP: 650.0000 mg | Freq: Four times a day (QID) | RECTAL | Status: DC | PRN

## 2020-06-11 MED ORDER — ONDANSETRON HCL 4 MG PO TABS: 4.0000 mg | ORAL_TABLET | Freq: Four times a day (QID) | ORAL | Status: DC | PRN

## 2020-06-11 MED ORDER — HYDROCERIN EX CREA
1.0000 "application " | TOPICAL_CREAM | Freq: Two times a day (BID) | CUTANEOUS | Status: DC
  Administered 2020-06-11 – 2020-06-15 (×8): 1 via TOPICAL
  Filled 2020-06-11 (×2): qty 113

## 2020-06-11 MED ORDER — LACTATED RINGERS IV SOLN: INTRAVENOUS | Status: DC

## 2020-06-11 MED ORDER — DILTIAZEM HCL ER COATED BEADS 120 MG PO CP24
120.0000 mg | ORAL_CAPSULE | Freq: Every day | ORAL | Status: DC
  Administered 2020-06-11 – 2020-06-15 (×5): 120 mg via ORAL
  Filled 2020-06-11 (×5): qty 1

## 2020-06-11 MED ORDER — METOPROLOL SUCCINATE ER 100 MG PO TB24
200.0000 mg | ORAL_TABLET | Freq: Every day | ORAL | Status: DC
  Administered 2020-06-12 – 2020-06-15 (×4): 200 mg via ORAL
  Filled 2020-06-11 (×4): qty 2

## 2020-06-11 MED ORDER — APIXABAN 5 MG PO TABS
5.0000 mg | ORAL_TABLET | Freq: Two times a day (BID) | ORAL | Status: DC
  Administered 2020-06-11 – 2020-06-15 (×9): 5 mg via ORAL
  Filled 2020-06-11 (×9): qty 1

## 2020-06-11 MED ORDER — ACETAMINOPHEN 325 MG PO TABS
650.0000 mg | ORAL_TABLET | Freq: Four times a day (QID) | ORAL | Status: DC | PRN
  Filled 2020-06-11: qty 2

## 2020-06-11 MED ORDER — VANCOMYCIN HCL 10 G IV SOLR
2500.0000 mg | Freq: Once | INTRAVENOUS | Status: AC
  Administered 2020-06-11: 2500 mg via INTRAVENOUS
  Filled 2020-06-11: qty 2500

## 2020-06-11 MED ORDER — SODIUM CHLORIDE 0.9 % IV SOLN
2.0000 g | INTRAVENOUS | Status: DC
  Administered 2020-06-11 – 2020-06-14 (×4): 2 g via INTRAVENOUS
  Filled 2020-06-11 (×2): qty 20
  Filled 2020-06-11 (×2): qty 2
  Filled 2020-06-11: qty 20

## 2020-06-11 MED ORDER — PANTOPRAZOLE SODIUM 20 MG PO TBEC
20.0000 mg | DELAYED_RELEASE_TABLET | Freq: Every day | ORAL | Status: DC
  Administered 2020-06-12 – 2020-06-15 (×4): 20 mg via ORAL
  Filled 2020-06-11 (×4): qty 1

## 2020-06-11 MED ORDER — CLINDAMYCIN PHOSPHATE 600 MG/50ML IV SOLN: 600.0000 mg | Freq: Once | INTRAVENOUS | Status: DC

## 2020-06-11 MED ORDER — ONDANSETRON HCL 4 MG/2ML IJ SOLN: 4.0000 mg | Freq: Four times a day (QID) | INTRAMUSCULAR | Status: DC | PRN

## 2020-06-11 NOTE — Progress Notes (Signed)
Pharmacy Antibiotic Note  Dakota Hernandez is a 54 y.o. male admitted on 06/11/2020 with cellulitis.  Pharmacy has been consulted for vancomycin dosing.  Fevers/chills, WBC 13.9, hypotensive  Plan: Vancomycin 2500 mg IV x 1, then 1750 mg IV every 24 hours (Vancomycin trough target 15-20) Monitor renal function, clinical progression and LOT Vancomycin trough at steady state  Height: 5\' 10"  (177.8 cm) Weight: (!) 164.2 kg (362 lb) IBW/kg (Calculated) : 73  Temp (24hrs), Avg:99.2 F (37.3 C), Min:98.3 F (36.8 C), Max:100.2 F (37.9 C)  Recent Labs  Lab 06/11/20 0427  WBC 13.9*  CREATININE 1.42*  LATICACIDVEN 1.4    Estimated Creatinine Clearance: 93.2 mL/min (A) (by C-G formula based on SCr of 1.42 mg/dL (H)).    Allergies  Allergen Reactions  . Hydrochlorothiazide Other (See Comments)    Reaction not recalled by the patient    08/11/20, PharmD Clinical Pharmacist ED Pharmacist Phone # [phone] 06/11/2020 10:42 AM

## 2020-06-11 NOTE — H&P (Signed)
History and Physical    Dakota Hernandez JOA:[id number] DOB: [birth year] DOA: 06/11/2020  PCP: Cassandria Anger, MD Consultants:  Annamaria Boots - pulmonology; Stanford Breed - cardiology Patient coming from:  Home - lives alone; NOK: Dakota Hernandez,  [phone]  Chief Complaint: RLE cellulitis  HPI: Dakota Hernandez is a 54 y.o. male with medical history significant of OSA on CPAP; morbid obesity (BMI 51.94); HTN; hypogonadism; diet-controlled DM; chronic systolic CHF; and afib on Eliquis presenting with recurrent RLE cellulitis. Patient reports that he has cellulitis "again."  He previously had it 2 months ago and was admitted here.  It appeared to have resolved completely.  Yesterday he wasn't feeling well and noticed his RLE was red.  Fever to 102.    He was previously hospitalized from 7/11-13 with sepsis of uncertain etiology, possible from BLE cellulitis. He was treated with Ancef - > Keflex.     ED Course:  Cellulitis - red RLE, fever to 101.  Started on Vanc.  Review of Systems: As per HPI; otherwise review of systems reviewed and negative.   Ambulatory Status:  Ambulates without assistance  COVID Vaccine Status:   Complete  Past Medical History:  Diagnosis Date  . Atrial fibrillation ([place])    a. s/p multiple DCCV's ---> now rate-controlled. On Eliquis for anticoagulation  . Cardiomyopathy ([place])    a. EF previously at 25-30% (Tachycardia-mediated?) --> at 55-60% by echo in 03/2016  . Cellulitis and abscess of right leg   . Diabetes mellitus without complication ([place])   . ED (erectile dysfunction)   . Hypertension   . Hypogonadism male   . Obesity   . OSA (obstructive sleep apnea)    a. on CPAP    Past Surgical History:  Procedure Laterality Date  . APPENDECTOMY  1981  . CARDIOVERSION N/A 03/24/2013   Procedure: CARDIOVERSION;  Surgeon: Lelon Perla, MD;  Location: [facility] ENDOSCOPY;  Service: Cardiovascular;  Laterality: N/A;  . EYE SURGERY  in 1st grade    Left eye  . LAPAROSCOPIC GASTRIC SLEEVE RESECTION  8/13    Social History   Socioeconomic History  . Marital status: Divorced    Spouse name: [name]  . Number of children: N  . Years of education: Not on file  . Highest education level: Not on file  Occupational History  . Occupation: Chiropractor: [employer]    Employer: [employer]  Tobacco Use  . Smoking status: Never Smoker  . Smokeless tobacco: Never Used  Vaping Use  . Vaping Use: Never used  Substance and Sexual Activity  . Alcohol use: No  . Drug use: No  . Sexual activity: Yes  Other Topics Concern  . Not on file  Social History Narrative   Got married 08/2010      Regular Exercise- yes         Social Determinants of Health   Financial Resource Strain:   . Difficulty of Paying Living Expenses: Not on file  Food Insecurity:   . Worried About Charity fundraiser in the Last Year: Not on file  . Ran Out of Food in the Last Year: Not on file  Transportation Needs:   . Lack of Transportation (Medical): Not on file  . Lack of Transportation (Non-Medical): Not on file  Physical Activity:   . Days of Exercise per Week: Not on file  . Minutes of Exercise per Session: Not on file  Stress:   . Feeling of Stress :  Not on file  Social Connections:   . Frequency of Communication with Friends and Family: Not on file  . Frequency of Social Gatherings with Friends and Family: Not on file  . Attends Religious Services: Not on file  . Active Member of Clubs or Organizations: Not on file  . Attends Archivist Meetings: Not on file  . Marital Status: Not on file  Intimate Partner Violence:   . Fear of Current or Ex-Partner: Not on file  . Emotionally Abused: Not on file  . Physically Abused: Not on file  . Sexually Abused: Not on file    Allergies  Allergen Reactions  . Hydrochlorothiazide Other (See Comments)    Reaction not recalled by the patient    Family History  Problem Relation  Age of Onset  . Heart attack Father 44  . Heart disease Father 9  . Heart attack Other        cousin  . Heart disease Other     Prior to Admission medications   Medication Sig Start Date End Date Taking? Authorizing Provider  apixaban (ELIQUIS) 5 MG TABS tablet Take 1 tablet (5 mg total) by mouth 2 (two) times daily. Patient not taking: Reported on 05/03/2020 02/01/20   Lelon Perla, MD  diltiazem (CARDIZEM CD) 120 MG 24 hr capsule Take 1 capsule (120 mg total) by mouth daily. 02/01/20 05/01/20  Lelon Perla, MD  furosemide (LASIX) 20 MG tablet Take 1 tablet (20 mg total) by mouth daily. 05/03/20 08/01/20  Lelon Perla, MD  lisinopril (ZESTRIL) 20 MG tablet TAKE 1 TABLET BY MOUTH  DAILY Patient taking differently: Take 20 mg by mouth daily.  08/11/19   Lelon Perla, MD  metoprolol (TOPROL-XL) 200 MG 24 hr tablet TAKE 1 TABLET BY MOUTH  DAILY Patient taking differently: Take 200 mg by mouth daily.  08/11/19   Lelon Perla, MD  omeprazole (PRILOSEC OTC) 20 MG tablet Take 20 mg by mouth daily before breakfast.     [provider]    Physical Exam: Vitals:   06/11/20 0930 06/11/20 0956 06/11/20 1000 06/11/20 1243  BP: 118/64  119/67 128/75  Pulse: (!) 105  (!) 37 96  Resp: 20  (!) 23 (!) 26  Temp:  100.2 F (37.9 C)  98.1 F (36.7 C)  TempSrc:  Oral  Oral  SpO2: 98%  95% 98%  Weight:      Height:         . General:  Appears ill, diaphoretic, warm to touch . Eyes:  PERRL, EOMI, normal lids, iris . ENT:  grossly normal hearing, lips & tongue, mmm . Neck:  no LAD, masses or thyromegaly . Cardiovascular:  RRR, no m/r/g. No LE edema.  Dakota Kitchen Respiratory:   CTA bilaterally with no wheezes/rales/rhonchi.  Normal respiratory effort. . Abdomen:  soft, NT, ND, morbidly obese . Skin:  Evidence of chronic venous stasis B but with RLE erythema already extending beyond drawn margins, chronic-appearing wound on R anterior lower leg       . Musculoskeletal:   grossly normal tone BUE/BLE, good ROM, no bony abnormality . Psychiatric:  grossly normal mood and affect, speech fluent and appropriate, AOx3 . Neurologic:  CN 2-12 grossly intact, moves all extremities in coordinated fashion    Radiological Exams on Admission: No results found.  EKG: not done   Labs on Admission: I have personally reviewed the available labs and imaging studies at the time of the admission.  Pertinent labs:   Glucose 120 BUN 18/Creatinine 1.42/GFR 56 - at/near aseline Lactate 1.4, 1.3 WBC 13.9 Platelets 143 UA WNL   Assessment/Plan Principal Problem:   Cellulitis and abscess of right leg Active Problems:   OBESITY, MORBID   Essential hypertension   OSA (obstructive sleep apnea)   [place]-induced atrial fibrillation   Diet-controlled diabetes mellitus (HCC)   Congestive dilated cardiomyopathy (HCC)   RLE cellulitis -Patient was previously admitted in July with sepsis of uncertain etiology, possibly associated with BLE cellulitis -Now here with clearly RLE cellulitis -Moderate infection based on fever, tachypnea, leukocytosis -There is no evidence of sepsis at this time, as he has no apparent acute organ deficiency -Careful inspection of the skin does not show skin breaks although there is a chronic-appearing wound on the anterior lower leg; will request wound care consult -CRP, ESR, and procalcitonin are pending -He was given Vanc in the ER -By the cellulitis order set, this would actually be considered moderate non-purulent cellulitis and treatment with Ancef or Rocephin is reasonable; will give Rocephin at this time -Will not give Clindamycin due to rapidly developing resistance for this antibiotic nationally -Cephalosporins do not have activity against Enterococcus and so additional coverage will be needed if this organism is of concern -If improved tomorrow, he likely can be discharged; if not, he may need broadening of her antibiotics  again  Stage 3a CKD -Fairly widely variable baseline creatinine but appears to be at/near baseline -Will follow -Hold Lasix, Lisinopril and give gentle IVF at 100cc/hr x 10 hours  Afib  -Rate controlled with Cardizem and Toprol -Continue Eliquis  HTN -Continue Cardizem -Resume Toprol tomorrow -Hold Lisinopril for now  DM -Diet controlled -Prior A1c on 04/15/20 was 5.7, indicating good control -Will follow for now without intervention  OSA -Continue CPAP  Chronic diastolic CHF -h/o systolic CHF which was normalized on echo on 04/10/71 -Diastolic function was unable to be determined -Appears to be compensated at this time  Morbid obesity Body mass index is 51.94 kg/m. -Weight loss should be encouraged -Has previously failed gastric sleeve -Outpatient PCP/bariatric medicine/bariatric surgery f/u encouraged     Note: This patient has been tested and is negative for the novel coronavirus COVID-19.  DVT prophylaxis:  Lovenox  Code Status:  Full - confirmed with patient Family Communication: None present Disposition Plan:  The patient is from: home  Anticipated d/c is to: home without [facility] services  Anticipated d/c date will depend on clinical response to treatment, but possibly as early as tomorrow if he has excellent response to treatment  Patient is currently: acutely ill Consults called: Wound care Admission status:  It is my clinical opinion that referral for OBSERVATION is reasonable and necessary in this patient based on the above information provided. The aforementioned taken together are felt to place the patient at high risk for further clinical deterioration. However it is anticipated that the patient may be medically stable for discharge from the hospital within 24 to 48 hours.    Karmen Bongo MD [facility]   How to contact the [facility] Attending or Consulting provider [place] or covering provider during after hours [place], for this patient?  1. Check  the care team in [facility] and look for a) attending/consulting [facility] provider listed and b) the [facility] team listed 2. Log into [email or web address] and use [place]'s universal password to access. If you do not have the password, please contact the hospital operator. 3. Locate the [facility] provider you are  looking for under [facility] and page to a number that you can be directly reached. 4. If you still have difficulty reaching the provider, please page the [facility] (Director on Call) for the Hospitalists listed on amion for assistance.   06/11/2020, 2:22 PM

## 2020-06-11 NOTE — ED Notes (Addendum)
Attempt report x1  

## 2020-06-11 NOTE — ED Triage Notes (Signed)
Pt reports recurring cellulitis to the R lower leg that started today, redness, and warmth noted.

## 2020-06-11 NOTE — ED Notes (Signed)
REport given to [facility] RN

## 2020-06-11 NOTE — Consult Note (Signed)
WOC Nurse wound consult note Consultation was completed by review of records, images and assistance from the bedside nurse/clinical staff.   Reason for Consult: cellulitis  Wound type: no open wound Measurement: NA Wound bed: scabs noted right pretibial  Drainage (amount, consistency, odor) none noted on images reviewed.  Periwound: hemosiderin staining bilaterally, erythema extending medial and laterally; posterior upper calf   Dressing procedure/placement/frequency: No topical wound care needed.  Elevate as much as possible  Eucerin to the bilateral LEs, not between the toes.   Follow up with primary care for compression therapy if appropriate (arterial flow is not compromised).  Re consult if needed, will not follow at this time. Thanks  Vamsi Apfel [employer], RN,CWOCN, CNS, CWON-AP [phone])

## 2020-06-11 NOTE — ED Provider Notes (Signed)
[facility] EMERGENCY DEPARTMENT Provider Note   CSN: [id number] Arrival date & time: 06/11/20  0409     History Chief Complaint  Patient presents with  . Cellulitis    Leoncio Hansen Adamson is a 54 y.o. male past medical history significant for A. fib on Eliquis, cardiomyopathy, hypertension, OSA, chronic venous insufficiency. Also history of diabetes, has not been on medications "for years" as it has been well controlled. Echo in 03/2020 with LVEF 55-60%.  HPI Patient reports to emergency department today with chief complaint of right leg cellulitis x2 days.  He states yesterday he noticed he had a area of redness on his leg.  Throughout the day he had fever and chills, T-max was 100.1.  Last dose of Tylenol was at 2 AM.  The redness continued to increase rapidly throughout the day. He has dull aching pain in right lower leg, rating 2/10 in severity. No modifying or radiating factors. Denies recent injury or trauma to right leg, numbness, weakness, decreased sensation.    Past Medical History:  Diagnosis Date  . Atrial fibrillation ([place])    a. s/p multiple DCCV's ---> now rate-controlled. On Eliquis for anticoagulation  . Cardiomyopathy ([place])    a. EF previously at 25-30% (Tachycardia-mediated?) --> at 55-60% by echo in 03/2016  . Diabetes mellitus without complication ([place])   . ED (erectile dysfunction)   . Hypertension   . Hypogonadism male   . Obesity   . OSA (obstructive sleep apnea)    a. on CPAP    Patient Active Problem List   Diagnosis Date Noted  . Cellulitis and abscess of right leg 06/11/2020  . Febrile illness 04/14/2020  . Dyspnea 04/14/2020  . Congestive dilated cardiomyopathy ([place]) 02/14/2013  . Vitamin B12 deficiency 01/17/2013  . Diabetes ([place]) 07/15/2012  . Permanent atrial fibrillation with RVR ([place]) 07/15/2012  . Onychomycosis 01/28/2012  . Bladder neck obstruction 01/28/2012  . Campath-induced atrial fibrillation 10/01/2011  .  Hypogonadism male 03/27/2011  . Vitamin D deficiency disease 03/27/2011  . OSA (obstructive sleep apnea) 03/13/2011  . HYPERGLYCEMIA 09/22/2010  . VENOUS INSUFFICIENCY, CHRONIC 03/24/2010  . FATIGUE 03/24/2010  . Edema 03/24/2010  . OBESITY, MORBID 02/08/2008  . Essential hypertension 02/08/2008    Past Surgical History:  Procedure Laterality Date  . APPENDECTOMY  1981  . CARDIOVERSION N/A 03/24/2013   Procedure: CARDIOVERSION;  Surgeon: Lelon Perla, MD;  Location: [facility] ENDOSCOPY;  Service: Cardiovascular;  Laterality: N/A;  . EYE SURGERY  in 1st grade   Left eye  . LAPAROSCOPIC GASTRIC SLEEVE RESECTION  8/13       Family History  Problem Relation Age of Onset  . Heart attack Father 41  . Heart disease Father 72  . Heart attack Other        cousin  . Heart disease Other     Social History   Tobacco Use  . Smoking status: Never Smoker  . Smokeless tobacco: Never Used  Vaping Use  . Vaping Use: Never used  Substance Use Topics  . Alcohol use: No  . Drug use: No    Home Medications Prior to Admission medications   Medication Sig Start Date End Date Taking? Authorizing Provider  acetaminophen (TYLENOL) 500 MG tablet Take 1,000 mg by mouth every 6 (six) hours as needed for mild pain or fever.   Yes [provider]  apixaban (ELIQUIS) 5 MG TABS tablet Take 1 tablet (5 mg total) by mouth 2 (two) times daily. 02/01/20  Yes Lelon Perla, MD  diltiazem (CARDIZEM CD) 120 MG 24 hr capsule Take 1 capsule (120 mg total) by mouth daily. 02/01/20 06/11/20 Yes Lelon Perla, MD  furosemide (LASIX) 20 MG tablet Take 1 tablet (20 mg total) by mouth daily. 05/03/20 08/01/20 Yes Lelon Perla, MD  lisinopril (ZESTRIL) 20 MG tablet TAKE 1 TABLET BY MOUTH  DAILY Patient taking differently: Take 20 mg by mouth daily.  08/11/19  Yes Lelon Perla, MD  metoprolol (TOPROL-XL) 200 MG 24 hr tablet TAKE 1 TABLET BY MOUTH  DAILY Patient taking differently: Take 200 mg by  mouth daily.  08/11/19  Yes Lelon Perla, MD  omeprazole (PRILOSEC OTC) 20 MG tablet Take 20 mg by mouth daily before breakfast.    Yes [provider]    Allergies    Hydrochlorothiazide  Review of Systems   Review of Systems All other systems are reviewed and are negative for acute change except as noted in the HPI.  Physical Exam Updated Vital Signs BP 114/76 (BP Location: Right Arm)   Pulse 97   Temp 100.2 F (37.9 C) (Oral)   Resp 20   Ht _0  (1.778 m)   Wt (!) 164.2 kg   SpO2 98%   BMI 51.94 kg/m   Physical Exam Vitals and nursing note reviewed.  Constitutional:      General: He is not in acute distress.    Appearance: He is not ill-appearing.  HENT:     Head: Normocephalic and atraumatic.     Right Ear: Tympanic membrane and external ear normal.     Left Ear: Tympanic membrane and external ear normal.     Nose: Nose normal.     Mouth/Throat:     Mouth: Mucous membranes are moist.     Pharynx: Oropharynx is clear.  Eyes:     General: No scleral icterus.       Right eye: No discharge.        Left eye: No discharge.     Extraocular Movements: Extraocular movements intact.     Conjunctiva/sclera: Conjunctivae normal.     Pupils: Pupils are equal, round, and reactive to light.  Neck:     Vascular: No JVD.  Cardiovascular:     Rate and Rhythm: Tachycardia present. Rhythm irregular.     Pulses: Normal pulses.          Radial pulses are 2+ on the right side and 2+ on the left side.       Dorsalis pedis pulses are 2+ on the right side and 2+ on the left side.     Heart sounds: Normal heart sounds.     Comments: HR ranging from 104-115 during exam Pulmonary:     Comments: Lungs clear to auscultation in all fields. Symmetric chest rise. No wheezing, rales, or rhonchi. Abdominal:     Comments: Abdomen is soft, non-distended, and non-tender in all quadrants. No rigidity, no guarding. No peritoneal signs.  Musculoskeletal:        General: Normal  range of motion.     Cervical back: Normal range of motion.  Skin:    General: Skin is warm and dry.     Capillary Refill: Capillary refill takes less than 2 seconds.     Findings: Erythema present.     Comments: Pease see media below  Extensive erythema on circumferential right lower extremity from knee to ankle. No palpable fluctuance, no gross abscess. Warm to the touch. Has an  area of scaring on right shin from injury as a teenager.  Neurological:     Mental Status: He is oriented to person, place, and time.     GCS: GCS eye subscore is 4. GCS verbal subscore is 5. GCS motor subscore is 6.     Comments: Fluent speech, no facial droop.  Psychiatric:        Behavior: Behavior normal.         ED Results / Procedures / Treatments   Labs (all labs ordered are listed, but only abnormal results are displayed) Labs Reviewed  COMPREHENSIVE METABOLIC PANEL - Abnormal; Notable for the following components:      Result Value   Glucose, Bld 120 (*)    Creatinine, Ser 1.42 (*)    Calcium 8.5 (*)    Total Protein 6.3 (*)    Albumin 3.3 (*)    Total Bilirubin 1.5 (*)    GFR calc non Af Amer 56 (*)    All other components within normal limits  CBC WITH DIFFERENTIAL/PLATELET - Abnormal; Notable for the following components:   WBC 13.9 (*)    Platelets 143 (*)    Neutro Abs 12.8 (*)    Lymphs Abs 0.4 (*)    Abs Immature Granulocytes 0.11 (*)    All other components within normal limits  CBG MONITORING, ED - Abnormal; Notable for the following components:   Glucose-Capillary 108 (*)    All other components within normal limits  SARS CORONAVIRUS 2 BY RT PCR (HOSPITAL ORDER, [facility])  CULTURE, BLOOD (ROUTINE X 2)  CULTURE, BLOOD (ROUTINE X 2)  LACTIC ACID, PLASMA  LACTIC ACID, PLASMA  URINALYSIS, ROUTINE W REFLEX MICROSCOPIC    EKG None  Radiology No results found.  Procedures Procedures (including critical care time)  Medications Ordered in  ED Medications  vancomycin (VANCOCIN) 2,500 mg in sodium chloride 0.9 % 500 mL IVPB (2,500 mg Intravenous New Bag/Given 06/11/20 1114)  vancomycin (VANCOREADY) IVPB 1750 mg/350 mL (has no administration in time range)  acetaminophen (TYLENOL) tablet 650 mg (650 mg Oral Given 06/11/20 1110)    ED Course  I have reviewed the triage vital signs and the nursing notes.  Pertinent labs & imaging results thfrom at were available during my care of the patient were reviewed by me and considered in my medical decision making (see chart for details).  Clinical Course as of Jun 11 1201  Tue Jun 11, 2020  0955 Temperature rechecked, now 100.2. HR during exam ranging from 104-115   [KA]  1000 I reassessed patient and HR is 110  Pulse Rate(!): 37 [KA]    Clinical Course User Index [KA] Carly Sabo, Harley Hallmark, PA-C   MDM Rules/Calculators/A&P                          History provided by patient with additional history obtained from chart review.    54 yo male presenting with right lower leg cellulitis.  Patient was in the lobby approximately 5 hours because of high volume.  On ED arrival he had temp of 98.3.  The time he made it back to the exam room his temperature was 100.2.  He is also tachycardic ranging from 100-115.  His leg has circumferential erythema from right knee to ankle.  It is warm to the touch.  There is no purulent drainage or palpable abscess.  Is reflected in triage.  I viewed results which show  a leukocytosis of 13.9.  Slight bump in creatinine at 1.42, 1 month ago it was 1.08. Lactic acid within normal range. With concern for rapid infectious spread and SIRs criteria met, admission for IV antibiotics is warranted. Blood cultures ordered. Will hold off on fluids at this time as patient is HDS and has normal lactic acid x 2. Encourage PO fluids.  Erythema has been circled with skin marker. This case was discussed with Dr. Vanita Panda who has seen the patient and agrees with plan to admit.  IV  antibiotics ordered as well as tylenol for low grade temperature.  Assigned admission. Spoke with Dr. Lorin Mercy with hospitalist service who agrees to assume care of patient and bring into the hospital for further evaluation and management.     Portions of this note were generated with Lobbyist. Dictation errors may occur despite best attempts at proofreading.   Final Clinical Impression(s) / ED Diagnoses Final diagnoses:  Cellulitis of right lower extremity    Rx / DC Orders ED Discharge Orders    None       Flint Melter 06/11/20 1202    Carmin Muskrat, MD 06/12/20 [phone]

## 2020-06-12 DIAGNOSIS — E291 Testicular hypofunction: Secondary | ICD-10-CM | POA: Diagnosis present

## 2020-06-12 DIAGNOSIS — I130 Hypertensive heart and chronic kidney disease with heart failure and stage 1 through stage 4 chronic kidney disease, or unspecified chronic kidney disease: Secondary | ICD-10-CM | POA: Diagnosis present

## 2020-06-12 DIAGNOSIS — L0390 Cellulitis, unspecified: Secondary | ICD-10-CM | POA: Diagnosis present

## 2020-06-12 DIAGNOSIS — Z79899 Other long term (current) drug therapy: Secondary | ICD-10-CM | POA: Diagnosis not present

## 2020-06-12 DIAGNOSIS — I5032 Chronic diastolic (congestive) heart failure: Secondary | ICD-10-CM | POA: Diagnosis present

## 2020-06-12 DIAGNOSIS — Z8619 Personal history of other infectious and parasitic diseases: Secondary | ICD-10-CM | POA: Diagnosis not present

## 2020-06-12 DIAGNOSIS — N1831 Chronic kidney disease, stage 3a: Secondary | ICD-10-CM | POA: Diagnosis present

## 2020-06-12 DIAGNOSIS — Z20822 Contact with and (suspected) exposure to covid-19: Secondary | ICD-10-CM | POA: Diagnosis present

## 2020-06-12 DIAGNOSIS — G4733 Obstructive sleep apnea (adult) (pediatric): Secondary | ICD-10-CM | POA: Diagnosis present

## 2020-06-12 DIAGNOSIS — L02415 Cutaneous abscess of right lower limb: Secondary | ICD-10-CM | POA: Diagnosis present

## 2020-06-12 DIAGNOSIS — I420 Dilated cardiomyopathy: Secondary | ICD-10-CM | POA: Diagnosis present

## 2020-06-12 DIAGNOSIS — Z888 Allergy status to other drugs, medicaments and biological substances status: Secondary | ICD-10-CM | POA: Diagnosis not present

## 2020-06-12 DIAGNOSIS — Z9884 Bariatric surgery status: Secondary | ICD-10-CM | POA: Diagnosis not present

## 2020-06-12 DIAGNOSIS — Z8249 Family history of ischemic heart disease and other diseases of the circulatory system: Secondary | ICD-10-CM | POA: Diagnosis not present

## 2020-06-12 DIAGNOSIS — Z6841 Body Mass Index (BMI) 40.0 and over, adult: Secondary | ICD-10-CM | POA: Diagnosis not present

## 2020-06-12 DIAGNOSIS — I4891 Unspecified atrial fibrillation: Secondary | ICD-10-CM | POA: Diagnosis present

## 2020-06-12 DIAGNOSIS — L03115 Cellulitis of right lower limb: Secondary | ICD-10-CM | POA: Diagnosis present

## 2020-06-12 DIAGNOSIS — E1122 Type 2 diabetes mellitus with diabetic chronic kidney disease: Secondary | ICD-10-CM | POA: Diagnosis present

## 2020-06-12 LAB — CBC
HCT: 40 % (ref 39.0–52.0)
Hemoglobin: 13.3 g/dL (ref 13.0–17.0)
MCH: 29.6 pg (ref 26.0–34.0)
MCHC: 33.3 g/dL (ref 30.0–36.0)
MCV: 88.9 fL (ref 80.0–100.0)
Platelets: 109 10*3/uL — ABNORMAL LOW (ref 150–400)
RBC: 4.5 MIL/uL (ref 4.22–5.81)
RDW: 13.1 % (ref 11.5–15.5)
WBC: 6.3 10*3/uL (ref 4.0–10.5)
nRBC: 0 % (ref 0.0–0.2)

## 2020-06-12 LAB — BASIC METABOLIC PANEL
Anion gap: 10 (ref 5–15)
BUN: 15 mg/dL (ref 6–20)
CO2: 22 mmol/L (ref 22–32)
Calcium: 8.1 mg/dL — ABNORMAL LOW (ref 8.9–10.3)
Chloride: 102 mmol/L (ref 98–111)
Creatinine, Ser: 1.34 mg/dL — ABNORMAL HIGH (ref 0.61–1.24)
GFR calc Af Amer: >60 mL/min (ref >60)
GFR calc non Af Amer: >60 mL/min (ref >60)
Glucose, Bld: 154 mg/dL — ABNORMAL HIGH (ref 70–99)
Potassium: 3.4 mmol/L — ABNORMAL LOW (ref 3.5–5.1)
Sodium: 134 mmol/L — ABNORMAL LOW (ref 135–145)

## 2020-06-12 LAB — PROCALCITONIN: Procalcitonin: 7.33 ng/mL

## 2020-06-12 NOTE — Progress Notes (Signed)
Patient refused the use of CPAP for the evening. RT will continue to monitor.  

## 2020-06-12 NOTE — Progress Notes (Signed)
Progress Note    Dakota Hernandez  KXF:[id number] DOB: Dec 29, 1965  DOA: 06/11/2020 PCP: Tresa Garter, MD    Brief Narrative:     Medical records reviewed and are as summarized below:  Dakota Hernandez is an 54 y.o. male with medical history significant of OSA on CPAP; morbid obesity (BMI 51.94); HTN; hypogonadism; diet-controlled DM; chronic systolic CHF; and afib on Eliquis presenting with recurrent RLE cellulitis. Patient reports that he has cellulitis "again."  He previously had it 2 months ago and was admitted here.  It appeared to have resolved completely.  Yesterday he wasn't feeling well and noticed his RLE was red.  Fever to 102.    He was previously hospitalized from 7/11-13 with sepsis of uncertain etiology, possible from BLE cellulitis. He was treated with Ancef - > Keflex.   Assessment/Plan:   Principal Problem:   Cellulitis and abscess of right leg Active Problems:   OBESITY, MORBID   Essential hypertension   OSA (obstructive sleep apnea)   Campath-induced atrial fibrillation   Diet-controlled diabetes mellitus (HCC)   Congestive dilated cardiomyopathy (HCC)    RLE cellulitis -Patient was previously admitted in July with sepsis of uncertain etiology, possibly associated with BLE cellulitis -Now here with clearly RLE cellulitis -Moderate infection based on fever, tachypnea, leukocytosis -There is no evidence of sepsis at this time, as he has no apparent acute organ deficiency -CRP is elevated and procalcitonin is elevated as well -Cellulitis has not progressed past the areas that were marked in the ER -He was given Vanc in the ER - Rocephin  -Elevate extremity -In the next 24 to 48 hours  Stage 3a CKD -Fairly widely variable baseline creatinine but appears to be at/near baseline -Will follow but unfortunately a.m. labs still not drawn  Afib -paroxysmal -Rate controlled with Cardizem and Toprol -Continue Eliquis  HTN -Continue  Cardizem -Resume Toprol  -Hold Lisinopril for now  DM -Diet controlled -Prior A1c on 04/15/20 was 5.7, indicating good control  OSA -Continue CPAP  Chronic diastolic CHF -h/o systolic CHF which was normalized on echo on 03/07/20 -Diastolic function was unable to be determined -Appears to be compensated at this time  Morbid obesity Body mass index is 51.94 kg/m.   Family Communication/Anticipated D/C date and plan/Code Status   DVT prophylaxis: Eliquis Code Status: Full Code.  Disposition Plan: Status is: Observation  The patient will require care spanning > 2 midnights and should be moved to inpatient because: IV treatments appropriate due to intensity of illness or inability to take PO  Dispo: The patient is from: Home              Anticipated d/c is to: Home              Anticipated d/c date is: 1 day              Patient currently is not medically stable to d/c.         Medical Consultants:    None.     Subjective:   No SOB, no CP  Objective:    Vitals:   06/11/20 1619 06/11/20 2006 06/12/20 0103 06/12/20 0824  BP: 134/88 (!) 162/86 (!) 144/99 (!) 141/94  Pulse: (!) 114 92 (!) 105   Resp: (!) 23 16 16    Temp: 98.9 F (37.2 C) 98.7 F (37.1 C) 98.3 F (36.8 C)   TempSrc: Oral Oral Oral   SpO2: 99% 95% 97%   Weight:  Height:        Intake/Output Summary (Last 24 hours) at 06/12/2020 1105 Last data filed at 06/12/2020 0300 Gross per 24 hour  Intake 1544.37 ml  Output 100 ml  Net 1444.37 ml   Filed Weights   06/11/20 0417  Weight: (!) 164.2 kg    Exam:  General: Appearance:    Severely obese male in no acute distress     Lungs:     Clear to auscultation bilaterally, respirations unlabored  Heart:    Tachycardic.  MS:   All extremities are intact.  Right Lower ext with redness to knee--chronically dark area mid-shin  Neurologic:   Awake, alert, oriented x 3. No apparent focal neurological           defect.     Data Reviewed:    I have personally reviewed following labs and imaging studies:  Labs: Labs show the following:   Basic Metabolic Panel: Recent Labs  Lab 06/11/20 0427  NA 136  K 3.7  CL 103  CO2 22  GLUCOSE 120*  BUN 18  CREATININE 1.42*  CALCIUM 8.5*   GFR Estimated Creatinine Clearance: 93.2 mL/min (A) (by C-G formula based on SCr of 1.42 mg/dL (H)). Liver Function Tests: Recent Labs  Lab 06/11/20 0427  AST 22  ALT 21  ALKPHOS 57  BILITOT 1.5*  PROT 6.3*  ALBUMIN 3.3*   No results for input(s): LIPASE, AMYLASE in the last 168 hours. No results for input(s): AMMONIA in the last 168 hours. Coagulation profile No results for input(s): INR, PROTIME in the last 168 hours.  CBC: Recent Labs  Lab 06/11/20 0427  WBC 13.9*  NEUTROABS 12.8*  HGB 14.5  HCT 42.2  MCV 86.8  PLT 143*   Cardiac Enzymes: No results for input(s): CKTOTAL, CKMB, CKMBINDEX, TROPONINI in the last 168 hours. BNP (last 3 results) No results for input(s): PROBNP in the last 8760 hours. CBG: Recent Labs  Lab 06/11/20 0943  GLUCAP 108*   D-Dimer: No results for input(s): DDIMER in the last 72 hours. Hgb A1c: No results for input(s): HGBA1C in the last 72 hours. Lipid Profile: No results for input(s): CHOL, HDL, LDLCALC, TRIG, CHOLHDL, LDLDIRECT in the last 72 hours. Thyroid function studies: No results for input(s): TSH, T4TOTAL, T3FREE, THYROIDAB in the last 72 hours.  Invalid input(s): FREET3 Anemia work up: No results for input(s): VITAMINB12, FOLATE, FERRITIN, TIBC, IRON, RETICCTPCT in the last 72 hours. Sepsis Labs: Recent Labs  Lab 06/11/20 0427 06/11/20 1000 06/11/20 1631  PROCALCITON  --   --  16.36  WBC 13.9*  --   --   LATICACIDVEN 1.4 1.3  --     Microbiology Recent Results (from the past 240 hour(s))  SARS Coronavirus 2 by RT PCR (hospital order, performed in [facility] hospital lab) Nasopharyngeal Nasopharyngeal Swab     Status: None   Collection Time: 06/11/20 10:27 AM    Specimen: Nasopharyngeal Swab  Result Value Ref Range Status   SARS Coronavirus 2 NEGATIVE NEGATIVE Final    Comment: (NOTE) SARS-CoV-2 target nucleic acids are NOT DETECTED.  The SARS-CoV-2 RNA is generally detectable in upper and lower respiratory specimens during the acute phase of infection. The lowest concentration of SARS-CoV-2 viral copies this assay can detect is 250 copies / mL. A negative result does not preclude SARS-CoV-2 infection and should not be used as the sole basis for treatment or other patient management decisions.  A negative result may occur with improper specimen collection /  handling, submission of specimen other than nasopharyngeal swab, presence of viral mutation(s) within the areas targeted by this assay, and inadequate number of viral copies (<250 copies / mL). A negative result must be combined with clinical observations, patient history, and epidemiological information.  Fact Sheet for Patients:   BoilerBrush.com.cy  Fact Sheet for Healthcare Providers: https://pope.com/  This test is not yet approved or  cleared by the [place] FDA and has been authorized for detection and/or diagnosis of SARS-CoV-2 by FDA under an Emergency Use Authorization (EUA).  This EUA will remain in effect (meaning this test can be used) for the duration of the COVID-19 declaration under Section 564(b)(1) of the Act, 21 U.S.C. section 360bbb-3(b)(1), unless the authorization is terminated or revoked sooner.  Performed at [facility], [address] [place], [place] [address]   Culture, blood (routine x 2)     Status: None (Preliminary result)   Collection Time: 06/11/20 10:48 AM   Specimen: BLOOD  Result Value Ref Range Status   Specimen Description BLOOD RIGHT ANTECUBITAL  Final   Special Requests   Final    BOTTLES DRAWN AEROBIC AND ANAEROBIC Blood Culture adequate volume   Culture   Final    NO GROWTH <  24 HOURS Performed at [facility], [address], [place], [place] [address]    Report Status PENDING  Incomplete  Culture, blood (routine x 2)     Status: None (Preliminary result)   Collection Time: 06/11/20 10:48 AM   Specimen: BLOOD RIGHT HAND  Result Value Ref Range Status   Specimen Description BLOOD RIGHT HAND  Final   Special Requests   Final    BOTTLES DRAWN AEROBIC AND ANAEROBIC Blood Culture adequate volume   Culture   Final    NO GROWTH < 24 HOURS Performed at [facility], [address] [place], [place] [address]    Report Status PENDING  Incomplete    Procedures and diagnostic studies:  No results found.  Medications:   . apixaban  5 mg Oral BID  . diltiazem  120 mg Oral Daily  . hydrocerin  1 application Topical BID  . metoprolol  200 mg Oral Daily  . pantoprazole  20 mg Oral QAC breakfast   Continuous Infusions: . cefTRIAXone (ROCEPHIN)  IV 2 g (06/11/20 1814)  . vancomycin       LOS: 0 days   Jameire Art  [facility]   How to contact the [facility] Attending or Consulting provider 7A - 7P or covering provider during after hours 7P -7A, for this patient?  1. Check the care team in [facility] and look for a) attending/consulting [facility] provider listed and b) the [facility] team listed 2. Log into [email or web address] and use [place]'s universal password to access. If you do not have the password, please contact the hospital operator. 3. Locate the [facility] provider you are looking for under [facility] and page to a number that you can be directly reached. 4. If you still have difficulty reaching the provider, please page the [facility] (Director on Call) for the Hospitalists listed on amion for assistance.  06/12/2020, 11:05 AM

## 2020-06-13 LAB — BASIC METABOLIC PANEL
Anion gap: 9 (ref 5–15)
BUN: 13 mg/dL (ref 6–20)
CO2: 22 mmol/L (ref 22–32)
Calcium: 8.2 mg/dL — ABNORMAL LOW (ref 8.9–10.3)
Chloride: 107 mmol/L (ref 98–111)
Creatinine, Ser: 1.16 mg/dL (ref 0.61–1.24)
GFR calc Af Amer: >60 mL/min (ref >60)
GFR calc non Af Amer: >60 mL/min (ref >60)
Glucose, Bld: 108 mg/dL — ABNORMAL HIGH (ref 70–99)
Potassium: 3.8 mmol/L (ref 3.5–5.1)
Sodium: 138 mmol/L (ref 135–145)

## 2020-06-13 LAB — CBC
HCT: 38.2 % — ABNORMAL LOW (ref 39.0–52.0)
Hemoglobin: 12.7 g/dL — ABNORMAL LOW (ref 13.0–17.0)
MCH: 29.5 pg (ref 26.0–34.0)
MCHC: 33.2 g/dL (ref 30.0–36.0)
MCV: 88.8 fL (ref 80.0–100.0)
Platelets: 117 10*3/uL — ABNORMAL LOW (ref 150–400)
RBC: 4.3 MIL/uL (ref 4.22–5.81)
RDW: 13.1 % (ref 11.5–15.5)
WBC: 4.3 10*3/uL (ref 4.0–10.5)
nRBC: 0 % (ref 0.0–0.2)

## 2020-06-13 LAB — PROCALCITONIN: Procalcitonin: 4.72 ng/mL

## 2020-06-13 MED ORDER — FUROSEMIDE 20 MG PO TABS
20.0000 mg | ORAL_TABLET | Freq: Every day | ORAL | Status: DC
  Administered 2020-06-13 – 2020-06-15 (×3): 20 mg via ORAL
  Filled 2020-06-13 (×3): qty 1

## 2020-06-13 NOTE — Progress Notes (Signed)
Progress Note    Dakota Hernandez  [id number] DOB: [birth year]  DOA: 06/11/2020 PCP: Tresa Garter, MD    Brief Narrative:     Medical records reviewed and are as summarized below:  Dakota Hernandez is an 54 y.o. male with medical history significant of OSA on CPAP; morbid obesity (BMI 51.94); HTN; hypogonadism; diet-controlled DM; chronic systolic CHF; and afib on Eliquis presenting with recurrent RLE cellulitis. Patient reports that he has cellulitis "again."  He previously had it 2 months ago and was admitted here.  It appeared to have resolved completely.  Yesterday he wasn't feeling well and noticed his RLE was red.  Fever to 102.    He was previously hospitalized from 7/11-13 with sepsis of uncertain etiology, possible from BLE cellulitis. He was treated with Ancef - > Keflex.   Assessment/Plan:   Principal Problem:   Cellulitis and abscess of right leg Active Problems:   OBESITY, MORBID   Essential hypertension   OSA (obstructive sleep apnea)   Campath-induced atrial fibrillation   Diet-controlled diabetes mellitus (HCC)   Congestive dilated cardiomyopathy (HCC)   Cellulitis    RLE cellulitis -Patient was previously admitted in July with sepsis of uncertain etiology, possibly associated with BLE cellulitis -Now here with clearly RLE cellulitis -Moderate infection based on fever, tachypnea, leukocytosis -There is no evidence of sepsis at this time, as he has no apparent acute organ deficiency -CRP is elevated and procalcitonin is elevated as well -Cellulitis has not progressed past the areas that were marked in the ER -He was given Vanc in the ER - Rocephin -- plan to change to keflex in the AM -Elevate extremity -resume lasix  Stage 3a CKD -Fairly widely variable baseline creatinine but appears to be at/near baseline -resume lasix  Afib -paroxysmal -Rate controlled with Cardizem and Toprol -Continue Eliquis  HTN -Continue  Cardizem -Resume Toprol  -Hold Lisinopril for now  DM -Diet controlled -Prior A1c on 04/15/20 was 5.7, indicating good control  OSA -Continue CPAP  Chronic diastolic CHF -h/o systolic CHF which was normalized on echo on 03/07/20 -Diastolic function was unable to be determined -Appears to be compensated at this time  Morbid obesity Body mass index is 51.94 kg/m.   Family Communication/Anticipated D/C date and plan/Code Status   DVT prophylaxis: Eliquis Code Status: Full Code.  Disposition Plan: Status is: inpt  The patient will require care spanning > 2 midnights and should be moved to inpatient because: IV treatments appropriate due to intensity of illness or inability to take PO  Dispo: The patient is from: Home              Anticipated d/c is to: Home              Anticipated d/c date is: 1 day              Patient currently is not medically stable to d/c.         Medical Consultants:    None.     Subjective:   Feels like he is having some LE edema more than normal (lasix had been held)  Objective:    Vitals:   06/12/20 1816 06/13/20 0023 06/13/20 0500 06/13/20 1251  BP:  (!) 137/94 139/89 115/89  Pulse: (!) 107 94 85 78  Resp:  18 18 18   Temp:  98.7 F (37.1 C) 98.5 F (36.9 C) 98.4 F (36.9 C)  TempSrc:  Oral Oral Oral  SpO2: 97% 96%  96% 98%  Weight:      Height:       No intake or output data in the 24 hours ending 06/13/20 1354 Filed Weights   06/11/20 0417  Weight: (!) 164.2 kg    Exam:   General: Appearance:    Severely obese male in no acute distress     Lungs:     respirations unlabored  Heart:    Normal heart rate.   MS:   All extremities are intact. Right leg with improving redness but still edematous  Neurologic:   Awake, alert, oriented x 3. No apparent focal neurological           defect.     Data Reviewed:   I have personally reviewed following labs and imaging studies:  Labs: Labs show the following:   Basic  Metabolic Panel: Recent Labs  Lab 06/11/20 0427 06/11/20 0427 06/12/20 1139 06/13/20 1013  NA 136  --  134* 138  K 3.7   < > 3.4* 3.8  CL 103  --  102 107  CO2 22  --  22 22  GLUCOSE 120*  --  154* 108*  BUN 18  --  15 13  CREATININE 1.42*  --  1.34* 1.16  CALCIUM 8.5*  --  8.1* 8.2*   < > = values in this interval not displayed.   GFR Estimated Creatinine Clearance: 114.1 mL/min (by C-G formula based on SCr of 1.16 mg/dL). Liver Function Tests: Recent Labs  Lab 06/11/20 0427  AST 22  ALT 21  ALKPHOS 57  BILITOT 1.5*  PROT 6.3*  ALBUMIN 3.3*   No results for input(s): LIPASE, AMYLASE in the last 168 hours. No results for input(s): AMMONIA in the last 168 hours. Coagulation profile No results for input(s): INR, PROTIME in the last 168 hours.  CBC: Recent Labs  Lab 06/11/20 0427 06/12/20 1139 06/13/20 1013  WBC 13.9* 6.3 4.3  NEUTROABS 12.8*  --   --   HGB 14.5 13.3 12.7*  HCT 42.2 40.0 38.2*  MCV 86.8 88.9 88.8  PLT 143* 109* 117*   Cardiac Enzymes: No results for input(s): CKTOTAL, CKMB, CKMBINDEX, TROPONINI in the last 168 hours. BNP (last 3 results) No results for input(s): PROBNP in the last 8760 hours. CBG: Recent Labs  Lab 06/11/20 0943  GLUCAP 108*   D-Dimer: No results for input(s): DDIMER in the last 72 hours. Hgb A1c: No results for input(s): HGBA1C in the last 72 hours. Lipid Profile: No results for input(s): CHOL, HDL, LDLCALC, TRIG, CHOLHDL, LDLDIRECT in the last 72 hours. Thyroid function studies: No results for input(s): TSH, T4TOTAL, T3FREE, THYROIDAB in the last 72 hours.  Invalid input(s): FREET3 Anemia work up: No results for input(s): VITAMINB12, FOLATE, FERRITIN, TIBC, IRON, RETICCTPCT in the last 72 hours. Sepsis Labs: Recent Labs  Lab 06/11/20 0427 06/11/20 1000 06/11/20 1631 06/12/20 1139 06/13/20 1013  PROCALCITON  --   --  16.36 7.33 4.72  WBC 13.9*  --   --  6.3 4.3  LATICACIDVEN 1.4 1.3  --   --   --      Microbiology Recent Results (from the past 240 hour(s))  SARS Coronavirus 2 by RT PCR (hospital order, performed in [facility] hospital lab) Nasopharyngeal Nasopharyngeal Swab     Status: None   Collection Time: 06/11/20 10:27 AM   Specimen: Nasopharyngeal Swab  Result Value Ref Range Status   SARS Coronavirus 2 NEGATIVE NEGATIVE Final    Comment: (NOTE) SARS-CoV-2  target nucleic acids are NOT DETECTED.  The SARS-CoV-2 RNA is generally detectable in upper and lower respiratory specimens during the acute phase of infection. The lowest concentration of SARS-CoV-2 viral copies this assay can detect is 250 copies / mL. A negative result does not preclude SARS-CoV-2 infection and should not be used as the sole basis for treatment or other patient management decisions.  A negative result may occur with improper specimen collection / handling, submission of specimen other than nasopharyngeal swab, presence of viral mutation(s) within the areas targeted by this assay, and inadequate number of viral copies (<250 copies / mL). A negative result must be combined with clinical observations, patient history, and epidemiological information.  Fact Sheet for Patients:   BoilerBrush.com.cy  Fact Sheet for Healthcare Providers: https://pope.com/  This test is not yet approved or  cleared by the [place] FDA and has been authorized for detection and/or diagnosis of SARS-CoV-2 by FDA under an Emergency Use Authorization (EUA).  This EUA will remain in effect (meaning this test can be used) for the duration of the COVID-19 declaration under Section 564(b)(1) of the Act, 21 U.S.C. section 360bbb-3(b)(1), unless the authorization is terminated or revoked sooner.  Performed at [facility], [address], [place], [place] [address]   Culture, blood (routine x 2)     Status: None (Preliminary result)   Collection Time: 06/11/20 10:48  AM   Specimen: BLOOD  Result Value Ref Range Status   Specimen Description BLOOD RIGHT ANTECUBITAL  Final   Special Requests   Final    BOTTLES DRAWN AEROBIC AND ANAEROBIC Blood Culture adequate volume   Culture   Final    NO GROWTH 2 DAYS Performed at [facility], [address], [place], [place] [address]    Report Status PENDING  Incomplete  Culture, blood (routine x 2)     Status: None (Preliminary result)   Collection Time: 06/11/20 10:48 AM   Specimen: BLOOD RIGHT HAND  Result Value Ref Range Status   Specimen Description BLOOD RIGHT HAND  Final   Special Requests   Final    BOTTLES DRAWN AEROBIC AND ANAEROBIC Blood Culture adequate volume   Culture   Final    NO GROWTH 2 DAYS Performed at [facility], [address], [name], [place] [address]    Report Status PENDING  Incomplete    Procedures and diagnostic studies:  No results found.  Medications:   . apixaban  5 mg Oral BID  . diltiazem  120 mg Oral Daily  . furosemide  20 mg Oral Daily  . hydrocerin  1 application Topical BID  . metoprolol  200 mg Oral Daily  . pantoprazole  20 mg Oral QAC breakfast   Continuous Infusions: . cefTRIAXone (ROCEPHIN)  IV 2 g (06/13/20 1252)     LOS: 1 day   Qusai Art  [facility]   How to contact the [facility] Attending or Consulting provider 7A - 7P or covering provider during after hours 7P -7A, for this patient?  1. Check the care team in [facility] and look for a) attending/consulting TRH provider listed and b) the [facility] team listed 2. Log into [email or web address] and use [place]'s universal password to access. If you do not have the password, please contact the hospital operator. 3. Locate the [facility] provider you are looking for under [facility] and page to a number that you can be directly reached. 4. If you still have difficulty  reaching the provider, please page the [facility] (Director on Call) for the Hospitalists listed on amion for  assistance.  06/13/2020, 1:54 PM

## 2020-06-13 NOTE — Progress Notes (Signed)
Patient refused CPAP for the night  

## 2020-06-13 NOTE — Discharge Instructions (Addendum)

## 2020-06-14 NOTE — Progress Notes (Signed)
Progress Note    Dakota Hernandez  [id number] DOB: December 14, 1965  DOA: 06/11/2020 PCP: Tresa Garter, MD    Brief Narrative:     Medical records reviewed and are as summarized below:  Dakota Hernandez is an 54 y.o. male with medical history significant of OSA on CPAP; morbid obesity (BMI 51.94); HTN; hypogonadism; diet-controlled DM; chronic systolic CHF; and afib on Eliquis presenting with recurrent RLE cellulitis. Patient reports that he has cellulitis "again."  He previously had it 2 months ago and was admitted here.  It appeared to have resolved completely.  Yesterday he wasn't feeling well and noticed his RLE was red.  Fever to 102.    He was previously hospitalized from 7/11-13 with sepsis of uncertain etiology, possible from BLE cellulitis. He was treated with Ancef - > Keflex.   Assessment/Plan:   Principal Problem:   Cellulitis and abscess of right leg Active Problems:   OBESITY, MORBID   Essential hypertension   OSA (obstructive sleep apnea)   Campath-induced atrial fibrillation   Diet-controlled diabetes mellitus (HCC)   Congestive dilated cardiomyopathy (HCC)   Cellulitis    RLE cellulitis -Patient was previously admitted in July with sepsis of uncertain etiology, possibly associated with BLE cellulitis -Now here with clearly RLE cellulitis -Moderate infection based on fever, tachypnea, leukocytosis -There is no evidence of sepsis at this time, as he has no apparent acute organ deficiency -CRP is elevated and procalcitonin is elevated as well -Cellulitis has not progressed past the areas that were marked in the ER -He was given Vanc in the ER - Rocephin another day-- plan to change to keflex in the AM -Elevate extremity -resume lasix  Stage 3a CKD -Fairly widely variable baseline creatinine but appears to be at/near baseline -resume lasix  Afib -paroxysmal -Rate controlled with Cardizem and Toprol -Continue Eliquis  HTN -Continue  Cardizem -Resume Toprol  -Hold Lisinopril for now  DM -Diet controlled -Prior A1c on 04/15/20 was 5.7, indicating good control  OSA -Continue CPAP  Chronic diastolic CHF -h/o systolic CHF which was normalized on echo on 03/07/20 -Diastolic function was unable to be determined -Appears to be compensated at this time  Morbid obesity Body mass index is 51.94 kg/m.   Family Communication/Anticipated D/C date and plan/Code Status   DVT prophylaxis: Eliquis Code Status: Full Code.  Disposition Plan: Status is: inpt  The patient will require care spanning > 2 midnights and should be moved to inpatient because: IV treatments appropriate due to intensity of illness or inability to take PO  Dispo: The patient is from: Home              Anticipated d/c is to: Home              Anticipated d/c date is: 1 day              Patient currently is not medically stable to d/c.         Medical Consultants:    None.     Subjective:   Still having pain in leg  Objective:    Vitals:   06/13/20 1726 06/13/20 2246 06/14/20 0548 06/14/20 1238  BP: (!) 147/93 (!) 130/94 (!) 152/96 124/76  Pulse: 77 87 93 95  Resp: 18 20 20 18   Temp: 98.5 F (36.9 C) 98.3 F (36.8 C) 97.6 F (36.4 C) 97.6 F (36.4 C)  TempSrc: Oral Oral Oral Oral  SpO2: 99% 98% 97% 99%  Weight:  Height:       No intake or output data in the 24 hours ending 06/14/20 1318 Filed Weights   06/11/20 0417  Weight: (!) 164.2 kg    Exam:   General: Appearance:    Severely obese male in no acute distress     Lungs:      respirations unlabored  Heart:    Normal heart rate.   MS:   All extremities are intact.   Neurologic:   Awake, alert, oriented x 3. No apparent focal neurological           defect.      Data Reviewed:   I have personally reviewed following labs and imaging studies:  Labs: Labs show the following:   Basic Metabolic Panel: Recent Labs  Lab 06/11/20 0427 06/11/20 0427  06/12/20 1139 06/13/20 1013  NA 136  --  134* 138  K 3.7   < > 3.4* 3.8  CL 103  --  102 107  CO2 22  --  22 22  GLUCOSE 120*  --  154* 108*  BUN 18  --  15 13  CREATININE 1.42*  --  1.34* 1.16  CALCIUM 8.5*  --  8.1* 8.2*   < > = values in this interval not displayed.   GFR Estimated Creatinine Clearance: 114.1 mL/min (by C-G formula based on SCr of 1.16 mg/dL). Liver Function Tests: Recent Labs  Lab 06/11/20 0427  AST 22  ALT 21  ALKPHOS 57  BILITOT 1.5*  PROT 6.3*  ALBUMIN 3.3*   No results for input(s): LIPASE, AMYLASE in the last 168 hours. No results for input(s): AMMONIA in the last 168 hours. Coagulation profile No results for input(s): INR, PROTIME in the last 168 hours.  CBC: Recent Labs  Lab 06/11/20 0427 06/12/20 1139 06/13/20 1013  WBC 13.9* 6.3 4.3  NEUTROABS 12.8*  --   --   HGB 14.5 13.3 12.7*  HCT 42.2 40.0 38.2*  MCV 86.8 88.9 88.8  PLT 143* 109* 117*   Cardiac Enzymes: No results for input(s): CKTOTAL, CKMB, CKMBINDEX, TROPONINI in the last 168 hours. BNP (last 3 results) No results for input(s): PROBNP in the last 8760 hours. CBG: Recent Labs  Lab 06/11/20 0943  GLUCAP 108*   D-Dimer: No results for input(s): DDIMER in the last 72 hours. Hgb A1c: No results for input(s): HGBA1C in the last 72 hours. Lipid Profile: No results for input(s): CHOL, HDL, LDLCALC, TRIG, CHOLHDL, LDLDIRECT in the last 72 hours. Thyroid function studies: No results for input(s): TSH, T4TOTAL, T3FREE, THYROIDAB in the last 72 hours.  Invalid input(s): FREET3 Anemia work up: No results for input(s): VITAMINB12, FOLATE, FERRITIN, TIBC, IRON, RETICCTPCT in the last 72 hours. Sepsis Labs: Recent Labs  Lab 06/11/20 0427 06/11/20 1000 06/11/20 1631 06/12/20 1139 06/13/20 1013  PROCALCITON  --   --  16.36 7.33 4.72  WBC 13.9*  --   --  6.3 4.3  LATICACIDVEN 1.4 1.3  --   --   --     Microbiology Recent Results (from the past 240 hour(s))  SARS  Coronavirus 2 by RT PCR (hospital order, performed in [facility] hospital lab) Nasopharyngeal Nasopharyngeal Swab     Status: None   Collection Time: 06/11/20 10:27 AM   Specimen: Nasopharyngeal Swab  Result Value Ref Range Status   SARS Coronavirus 2 NEGATIVE NEGATIVE Final    Comment: (NOTE) SARS-CoV-2 target nucleic acids are NOT DETECTED.  The SARS-CoV-2 RNA is generally detectable in  upper and lower respiratory specimens during the acute phase of infection. The lowest concentration of SARS-CoV-2 viral copies this assay can detect is 250 copies / mL. A negative result does not preclude SARS-CoV-2 infection and should not be used as the sole basis for treatment or other patient management decisions.  A negative result may occur with improper specimen collection / handling, submission of specimen other than nasopharyngeal swab, presence of viral mutation(s) within the areas targeted by this assay, and inadequate number of viral copies (<250 copies / mL). A negative result must be combined with clinical observations, patient history, and epidemiological information.  Fact Sheet for Patients:   BoilerBrush.com.cy  Fact Sheet for Healthcare Providers: https://pope.com/  This test is not yet approved or  cleared by the [place] FDA and has been authorized for detection and/or diagnosis of SARS-CoV-2 by FDA under an Emergency Use Authorization (EUA).  This EUA will remain in effect (meaning this test can be used) for the duration of the COVID-19 declaration under Section 564(b)(1) of the Act, 21 U.S.C. section 360bbb-3(b)(1), unless the authorization is terminated or revoked sooner.  Performed at [facility], [address], [place], [place] [address]   Culture, blood (routine x 2)     Status: None (Preliminary result)   Collection Time: 06/11/20 10:48 AM   Specimen: BLOOD  Result Value Ref Range Status   Specimen  Description BLOOD RIGHT ANTECUBITAL  Final   Special Requests   Final    BOTTLES DRAWN AEROBIC AND ANAEROBIC Blood Culture adequate volume   Culture   Final    NO GROWTH 3 DAYS Performed at [facility], [address], [place], [place] [address]    Report Status PENDING  Incomplete  Culture, blood (routine x 2)     Status: None (Preliminary result)   Collection Time: 06/11/20 10:48 AM   Specimen: BLOOD RIGHT HAND  Result Value Ref Range Status   Specimen Description BLOOD RIGHT HAND  Final   Special Requests   Final    BOTTLES DRAWN AEROBIC AND ANAEROBIC Blood Culture adequate volume   Culture   Final    NO GROWTH 3 DAYS Performed at [facility], [address], [place], [place] [address]    Report Status PENDING  Incomplete    Procedures and diagnostic studies:  No results found.  Medications:   . apixaban  5 mg Oral BID  . diltiazem  120 mg Oral Daily  . furosemide  20 mg Oral Daily  . hydrocerin  1 application Topical BID  . metoprolol  200 mg Oral Daily  . pantoprazole  20 mg Oral QAC breakfast   Continuous Infusions: . cefTRIAXone (ROCEPHIN)  IV 2 g (06/14/20 1314)     LOS: 2 days   [place] Art  [facility]   How to contact the [facility] Attending or Consulting provider 7A - 7P or covering provider during after hours 7P -7A, for this patient?  1. Check the care team in [facility] ([facility]) and look for a) attending/consulting TRH provider listed and b) the [facility] team listed 2. Log into [email or web address] and use [place]'s universal password to access. If you do not have the password, please contact the hospital operator. 3. Locate the [facility] provider you are looking for under [facility] and page to a number that you can be directly reached. 4. If you still have difficulty reaching the provider, please page the [facility] (Director on Call) for the Hospitalists listed  on amion for assistance.  06/14/2020, 1:18 PM

## 2020-06-15 ENCOUNTER — Other Ambulatory Visit: Payer: Self-pay

## 2020-06-15 MED ORDER — HYDROCERIN EX CREA: 1.0000 "application " | TOPICAL_CREAM | Freq: Two times a day (BID) | CUTANEOUS | 0 refills | Status: DC

## 2020-06-15 MED ORDER — CEPHALEXIN 500 MG PO CAPS: 500.0000 mg | ORAL_CAPSULE | Freq: Two times a day (BID) | ORAL | 0 refills | Status: DC

## 2020-06-15 MED ORDER — CEPHALEXIN 500 MG PO CAPS
500.0000 mg | ORAL_CAPSULE | Freq: Four times a day (QID) | ORAL | Status: DC
  Administered 2020-06-15: 500 mg via ORAL
  Filled 2020-06-15: qty 1

## 2020-06-15 NOTE — Discharge Summary (Signed)
Physician Discharge Summary  [name] MEQ:[id number] DOB: [birth year] DOA: 06/11/2020  PCP: Tresa Garter, MD  Admit date: 06/11/2020 Discharge date: 06/15/2020  Admitted From: Home Discharge disposition: Home   Recommendations for Outpatient Follow-Up:   1. Patient to discuss options with cardiology.  It seems insurance is requiring patient to get 90-day supply wise which makes it impossible for him to use his coupon for $10 co pay and instead are charging him 1200$   Discharge Diagnosis:   Principal Problem:   Cellulitis and abscess of right leg Active Problems:   OBESITY, MORBID   Essential hypertension   OSA (obstructive sleep apnea)   [place]ath-induced atrial fibrillation   Diet-controlled diabetes mellitus (HCC)   Congestive dilated cardiomyopathy (HCC)   Cellulitis    Discharge Condition: Improved.  Diet recommendation: Low sodium, heart healthy.  Carbohydrate-modified  Wound care: None.  Code status: Full.   History of Present Illness:   Dakota Hernandez is a 54 y.o. male with medical history significant of OSA on CPAP; morbid obesity (BMI 51.94); HTN; hypogonadism; diet-controlled DM; chronic systolic CHF; and afib on Eliquis presenting with recurrent RLE cellulitis. Patient reports that he has cellulitis "again."  He previously had it 2 months ago and was admitted here.  It appeared to have resolved completely.  Yesterday he wasn't feeling well and noticed his RLE was red.  Fever to 102.    He was previously hospitalized from 7/11-13 with sepsis of uncertain etiology, possible from BLE cellulitis. He was treated with Ancef - > Keflex.    Hospital Course by Problem:   RLE cellulitis -Patient was previously admitted in July with sepsis of uncertain etiology, possibly associated with BLE cellulitis -Now here with clearly RLE cellulitis -Moderate infection based on fever, tachypnea, leukocytosis -There is no evidence of sepsis at this  time, as he has no apparent acute organ deficiency -CRP is elevated and procalcitonin is elevated as well -Cellulitis has not progressed past the areas that were marked in the ER -He was given Vanc in the ER - Rocephin changed to keflex -Elevate extremity -resume lasix  Stage 3a CKD -Fairly widely variable baseline creatinine but appears to be at/near baseline -resume lasix  Afib -paroxysmal -Rate controlled with Cardizem and Toprol -Continue Eliquis  HTN -Continue Cardizem -Resume Toprol  -resume lisinopril  DM -Diet controlled -Prior A1c on 04/15/20 was 5.7, indicating good control  OSA -Continue CPAP  Chronic diastolic CHF -h/o systolic CHF which was normalized on echo on 03/07/20 -Diastolic function was unable to bedetermined -Appears to be compensated at this time  Morbid obesity Body mass index is 51.94 kg/m.    Medical Consultants:      Discharge Exam:   Vitals:   06/15/20 0548 06/15/20 0832  BP: (!) 135/98 (!) 149/104  Pulse: 72 97  Resp: 20   Temp: 97.6 F (36.4 C)   SpO2: 98%    Vitals:   06/14/20 1947 06/15/20 0000 06/15/20 0548 06/15/20 0832  BP: (!) 129/99 137/79 (!) 135/98 (!) 149/104  Pulse: 79 86 72 97  Resp: 20 20 20    Temp: 98.2 F (36.8 C) 97.7 F (36.5 C) 97.6 F (36.4 C)   TempSrc: Oral Oral Oral   SpO2: 97% 95% 98%   Weight:      Height:        General exam: Appears calm and comfortable. Redness much improved in lower leg  The results of significant diagnostics from this hospitalization (including  imaging, microbiology, ancillary and laboratory) are listed below for reference.     Procedures and Diagnostic Studies:   No results found.   Labs:   Basic Metabolic Panel: Recent Labs  Lab 06/11/20 0427 06/11/20 0427 06/12/20 1139 06/13/20 1013  NA 136  --  134* 138  K 3.7   < > 3.4* 3.8  CL 103  --  102 107  CO2 22  --  22 22  GLUCOSE 120*  --  154* 108*  BUN 18  --  15 13  CREATININE 1.42*  --  1.34*  1.16  CALCIUM 8.5*  --  8.1* 8.2*   < > = values in this interval not displayed.   GFR Estimated Creatinine Clearance: 114.1 mL/min (by C-G formula based on SCr of 1.16 mg/dL). Liver Function Tests: Recent Labs  Lab 06/11/20 0427  AST 22  ALT 21  ALKPHOS 57  BILITOT 1.5*  PROT 6.3*  ALBUMIN 3.3*   No results for input(s): LIPASE, AMYLASE in the last 168 hours. No results for input(s): AMMONIA in the last 168 hours. Coagulation profile No results for input(s): INR, PROTIME in the last 168 hours.  CBC: Recent Labs  Lab 06/11/20 0427 06/12/20 1139 06/13/20 1013  WBC 13.9* 6.3 4.3  NEUTROABS 12.8*  --   --   HGB 14.5 13.3 12.7*  HCT 42.2 40.0 38.2*  MCV 86.8 88.9 88.8  PLT 143* 109* 117*   Cardiac Enzymes: No results for input(s): CKTOTAL, CKMB, CKMBINDEX, TROPONINI in the last 168 hours. BNP: Invalid input(s): POCBNP CBG: Recent Labs  Lab 06/11/20 0943  GLUCAP 108*   D-Dimer No results for input(s): DDIMER in the last 72 hours. Hgb A1c No results for input(s): HGBA1C in the last 72 hours. Lipid Profile No results for input(s): CHOL, HDL, LDLCALC, TRIG, CHOLHDL, LDLDIRECT in the last 72 hours. Thyroid function studies No results for input(s): TSH, T4TOTAL, T3FREE, THYROIDAB in the last 72 hours.  Invalid input(s): FREET3 Anemia work up No results for input(s): VITAMINB12, FOLATE, FERRITIN, TIBC, IRON, RETICCTPCT in the last 72 hours. Microbiology Recent Results (from the past 240 hour(s))  SARS Coronavirus 2 by RT PCR (hospital order, performed in [facility] hospital lab) Nasopharyngeal Nasopharyngeal Swab     Status: None   Collection Time: 06/11/20 10:27 AM   Specimen: Nasopharyngeal Swab  Result Value Ref Range Status   SARS Coronavirus 2 NEGATIVE NEGATIVE Final    Comment: (NOTE) SARS-CoV-2 target nucleic acids are NOT DETECTED.  The SARS-CoV-2 RNA is generally detectable in upper and lower respiratory specimens during the acute phase of infection.  The lowest concentration of SARS-CoV-2 viral copies this assay can detect is 250 copies / mL. A negative result does not preclude SARS-CoV-2 infection and should not be used as the sole basis for treatment or other patient management decisions.  A negative result may occur with improper specimen collection / handling, submission of specimen other than nasopharyngeal swab, presence of viral mutation(s) within the areas targeted by this assay, and inadequate number of viral copies (<250 copies / mL). A negative result must be combined with clinical observations, patient history, and epidemiological information.  Fact Sheet for Patients:   BoilerBrush.com.cy  Fact Sheet for Healthcare Providers: https://pope.com/  This test is not yet approved or  cleared by the [place] FDA and has been authorized for detection and/or diagnosis of SARS-CoV-2 by FDA under an Emergency Use Authorization (EUA).  This EUA will remain in effect (meaning this test can  be used) for the duration of the COVID-19 declaration under Section 564(b)(1) of the Act, 21 U.S.C. section 360bbb-3(b)(1), unless the authorization is terminated or revoked sooner.  Performed at [facility], [address], [place], [place] [address]   Culture, blood (routine x 2)     Status: None (Preliminary result)   Collection Time: 06/11/20 10:48 AM   Specimen: BLOOD  Result Value Ref Range Status   Specimen Description BLOOD RIGHT ANTECUBITAL  Final   Special Requests   Final    BOTTLES DRAWN AEROBIC AND ANAEROBIC Blood Culture adequate volume   Culture   Final    NO GROWTH 3 DAYS Performed at [facility], [address], [place], [place] [address]    Report Status PENDING  Incomplete  Culture, blood (routine x 2)     Status: None (Preliminary result)   Collection Time: 06/11/20 10:48 AM   Specimen: BLOOD RIGHT HAND  Result Value Ref Range Status   Specimen  Description BLOOD RIGHT HAND  Final   Special Requests   Final    BOTTLES DRAWN AEROBIC AND ANAEROBIC Blood Culture adequate volume   Culture   Final    NO GROWTH 3 DAYS Performed at [facility], [address], [place], [place] [address]    Report Status PENDING  Incomplete     Discharge Instructions:   Discharge Instructions    Diet - low sodium heart healthy   Complete by: As directed    Discharge instructions   Complete by: As directed    Keep legs elevated when not wearing stockings   Increase activity slowly   Complete by: As directed      Allergies as of 06/15/2020      Reactions   Hydrochlorothiazide Other (See Comments)   Reaction not recalled by the patient      Medication List    TAKE these medications   acetaminophen 500 MG tablet Commonly known as: TYLENOL Take 1,000 mg by mouth every 6 (six) hours as needed for mild pain or fever.   apixaban 5 MG Tabs tablet Commonly known as: ELIQUIS Take 1 tablet (5 mg total) by mouth 2 (two) times daily.   cephALEXin 500 MG capsule Commonly known as: KEFLEX Take 1 capsule (500 mg total) by mouth every 12 (twelve) hours.   diltiazem 120 MG 24 hr capsule Commonly known as: CARDIZEM CD Take 1 capsule (120 mg total) by mouth daily.   furosemide 20 MG tablet Commonly known as: LASIX Take 1 tablet (20 mg total) by mouth daily.   hydrocerin Crea Apply 1 application topically 2 (two) times daily.   lisinopril 20 MG tablet Commonly known as: ZESTRIL TAKE 1 TABLET BY MOUTH  DAILY   metoprolol 200 MG 24 hr tablet Commonly known as: TOPROL-XL TAKE 1 TABLET BY MOUTH  DAILY   omeprazole 20 MG tablet Commonly known as: PRILOSEC OTC Take 20 mg by mouth daily before breakfast.       Follow-up Information    Plotnikov, Georgina Quint, MD Follow up in 1 week(s).   Specialty: Internal Medicine Contact information: [address] [place] [address] [phone]        Lewayne Bunting, MD .     Specialty: Cardiology Contact information: [address] [place] [address] [phone]                Time coordinating discharge: 35 min  Signed:  Vihan Art DO  [facility] 06/15/2020, 10:19 AM

## 2020-06-15 NOTE — Progress Notes (Signed)
Pt spoke to CM regarding eliquis  Pt IV and telemetry removed by NT Pt has all belongings including out of work letter Pt discharge education provided at bedside Pt discharged via wheelchair with NT

## 2020-06-15 NOTE — Care Management (Signed)
Spoke with Mr. Cornwall at bedside about trouble affording Eliquis. He states his copay is almost 1200 dollars for a 3 month supply. States he still has money on his Eliquis copay card. However, he reports his insurance company is not allowing him to use the copay card with mail order pharmacy. States insurance company will not allow him to [employer] designer for [employer] refills.  Mr. Bogan reports he has about a week supply of Eliquis at home. Encouraged him to contact his Cardiologist office on Monday to discuss further options. He is agreeable. Mr. Sulkowski states he has tried to get copay assistance thru the pharmaceutical company in the past but was denied.   Also provided telephone number for Eliquis assistance which is Monday- Friday. Mr. Buening is comfortable with this plan. Discussed above with Dr. Benjamine Mola and update sent to patient's nurse.    Raiford Noble, MSN, RN,BSN [facility] Case Manager [phone]

## 2020-06-16 LAB — CULTURE, BLOOD (ROUTINE X 2)
Culture: NO GROWTH
Culture: NO GROWTH
Special Requests: ADEQUATE
Special Requests: ADEQUATE

## 2020-07-23 ENCOUNTER — Encounter: Payer: Self-pay | Admitting: Internal Medicine

## 2020-07-23 ENCOUNTER — Ambulatory Visit (INDEPENDENT_AMBULATORY_CARE_PROVIDER_SITE_OTHER): Payer: BC Managed Care – PPO | Admitting: Internal Medicine

## 2020-07-23 ENCOUNTER — Other Ambulatory Visit: Payer: Self-pay

## 2020-07-23 VITALS — BP 140/72 | HR 104 | Temp 97.3°F | Ht 70.0 in | Wt 373.2 lb

## 2020-07-23 DIAGNOSIS — G4733 Obstructive sleep apnea (adult) (pediatric): Secondary | ICD-10-CM

## 2020-07-23 NOTE — Progress Notes (Signed)
HPI Chronic Venous Insufficiency, Permanent AFib/ Eliquis, HTN, CM/CHF, DM, Morbid Obesity NPSG 05/06/20- AHI 22.4/ hr, desaturation to 67%,  CPAP to 12, body weight 366 lbs  ======================================================  04/17/20- 54 YO M never smoker for sleep evaluation courtesy of Dr Jens Som. Medical problem list includes Chronic Venous Insufficiency, Permanent AFib/ Eliquis, HTN, CM/CHF, DM, Morbid Obesity NPSG 02/05/2011:  AHI 38/hr with desat to 74% (Dr Shelle Iron) [facility] 7/11-7/13/21- SOB/ Fever attributed to possible BLE cellulitis, Rx'd Keflex  CTa unremarkable. Body weight today 370 lbs Epworth score 9 He had used CPAP originally, but dropped off due to frequent bathroom trips for nocturia, at same time then-wife dropped her own CPAP. Now admits loud snoring, daytime sleepiness watching TV. Works first/ second Software engineer. No sleep medicines. Sweet tea.  Currently nocturia 2-3 times. Weight gain 20-25 lbs 2 years. Denies ENT surgery  07/23/20-       Chronic Venous Insufficiency, Permanent AFib/ Eliquis, HTN, CM/CHF, DM, Morbid Obesity NPSG 05/06/20- AHI 22.4/ hr, desaturation to 67%,  CPAP to 12, body weight 366 lbs Hosp in September for cellulitis of R leg Body weight today- 373 lbs Covid  vax -2 Phizer Flu vax- declines We reviewed sleep study results and discussed CPAP as best initial option for him. He remains in permanent AFib. Says leg has healed. CTa chest 04/14/20- IMPRESSION: 1. No acute abnormality. 2. Technically borderline study without definite pulmonary emboli. 3. Cardiomegaly.  ROS-see HPI   + = positive Constitutional:    weight loss, night sweats, fevers, chills, fatigue, lassitude. HEENT:    headaches, difficulty swallowing, tooth/dental problems, sore throat,       sneezing, itching, ear ache, nasal congestion, post nasal drip, snoring CV:    chest pain, orthopnea, PND, +swelling in lower extremities, anasarca,                                   dizziness, +palpitations Resp:   +shortness of breath with exertion or at rest.                productive cough,   non-productive cough, coughing up of blood.              change in color of mucus.  wheezing.   Skin:    rash or lesions. GI:  No-   heartburn, indigestion, abdominal pain, nausea, vomiting, diarrhea,                 change in bowel habits, loss of appetite GU: dysuria, change in color of urine, no urgency or frequency.   flank pain. MS:   joint pain, stiffness, decreased range of motion, back pain. Neuro-     nothing unusual Psych:  change in mood or affect.  depression or anxiety.   memory loss.  OBJ- Physical Exam General- Alert, Oriented, Affect-appropriate, Distress- none acute,  + morbidly obese Skin- rash-none, lesions- none, excoriation- none Lymphadenopathy- none Head- atraumatic            Eyes- Gross vision intact, PERRLA, conjunctivae and secretions clear            Ears- Hearing, canals-normal            Nose- Clear, no-Septal dev, mucus, polyps, erosion, perforation             Throat- Mallampati III_IV , mucosa clear , drainage- none, tonsils- atrophic,  + teeth Neck- flexible , trachea midline, no  stridor , thyroid nl, carotid no bruit Chest - symmetrical excursion , unlabored           Heart/CV- +IRR/ AFib , no murmur , no gallop  , no rub, nl s1 s2                           - JVD+1 , edema+tight 4+, stasis changes- none, varices- none           Lung- clear to P&A, wheeze- none, cough- none , dullness-none, rub- none           Chest wall-  Abd-  Br/ Gen/ Rectal- Not done, not indicated Extrem- cyanosis- none, clubbing, none, atrophy- none, strength- nl Neuro- grossly intact to observation

## 2020-07-23 NOTE — Patient Instructions (Signed)
Order- new DME, new CPAP auto 5-15, mask of choice, humidifier, supplies, AirView/ card  Please call us or the home care company if you have any problems

## 2020-07-23 NOTE — Assessment & Plan Note (Signed)
Severe OSA. Appropriate discussion Plan- CPAP auto 5-15

## 2020-07-23 NOTE — Assessment & Plan Note (Signed)
This is controlling his life. 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  Consider if re-visit bariatric surgeon would help.

## 2020-07-30 ENCOUNTER — Encounter: Payer: Self-pay | Admitting: Internal Medicine

## 2020-07-30 ENCOUNTER — Other Ambulatory Visit: Payer: Self-pay

## 2020-07-30 ENCOUNTER — Ambulatory Visit (INDEPENDENT_AMBULATORY_CARE_PROVIDER_SITE_OTHER): Payer: BC Managed Care – PPO | Admitting: Internal Medicine

## 2020-07-30 DIAGNOSIS — I420 Dilated cardiomyopathy: Secondary | ICD-10-CM

## 2020-07-30 DIAGNOSIS — Z23 Encounter for immunization: Secondary | ICD-10-CM | POA: Diagnosis not present

## 2020-07-30 DIAGNOSIS — E119 Type 2 diabetes mellitus without complications: Secondary | ICD-10-CM

## 2020-07-30 DIAGNOSIS — E559 Vitamin D deficiency, unspecified: Secondary | ICD-10-CM

## 2020-07-30 DIAGNOSIS — E538 Deficiency of other specified B group vitamins: Secondary | ICD-10-CM

## 2020-07-30 DIAGNOSIS — L03115 Cellulitis of right lower limb: Secondary | ICD-10-CM

## 2020-07-30 MED ORDER — TRIAMCINOLONE ACETONIDE 0.1 % EX CREA: 1.0000 "application " | TOPICAL_CREAM | Freq: Two times a day (BID) | CUTANEOUS | 3 refills | Status: DC

## 2020-07-30 MED ORDER — CEPHALEXIN 500 MG PO CAPS: 500.0000 mg | ORAL_CAPSULE | Freq: Four times a day (QID) | ORAL | 0 refills | Status: DC

## 2020-07-30 NOTE — Progress Notes (Signed)
Subjective:  Patient ID: Dakota Hernandez, male    DOB: Aug 19, 1966  Age: 54 y.o. MRN: [id number]  CC: Follow-up (3 month F/U- Flu shot)   HPI Dakota Hernandez presents for recurrent cellulitis, A fib, CHF   Outpatient Medications Prior to Visit  Medication Sig Dispense Refill  . acetaminophen (TYLENOL) 500 MG tablet Take 1,000 mg by mouth every 6 (six) hours as needed for mild pain or fever.    [name] apixaban (ELIQUIS) 5 MG TABS tablet Take 1 tablet (5 mg total) by mouth 2 (two) times daily. 60 tablet 6  . furosemide (LASIX) 20 MG tablet Take 1 tablet (20 mg total) by mouth daily. 90 tablet 3  . lisinopril (ZESTRIL) 20 MG tablet TAKE 1 TABLET BY MOUTH  DAILY (Patient taking differently: Take 20 mg by mouth daily. ) 90 tablet 3  . metoprolol (TOPROL-XL) 200 MG 24 hr tablet TAKE 1 TABLET BY MOUTH  DAILY (Patient taking differently: Take 200 mg by mouth daily. ) 90 tablet 3  . omeprazole (PRILOSEC OTC) 20 MG tablet Take 20 mg by mouth daily before breakfast.     . diltiazem (CARDIZEM CD) 120 MG 24 hr capsule Take 1 capsule (120 mg total) by mouth daily. 90 capsule 3  . hydrocerin (EUCERIN) CREA Apply 1 application topically 2 (two) times daily. (Patient not taking: Reported on 07/30/2020)  0   No facility-administered medications prior to visit.    ROS: Review of Systems  Constitutional: Positive for fatigue. Negative for appetite change and unexpected weight change.  HENT: Negative for congestion, nosebleeds, sneezing, sore throat and trouble swallowing.   Eyes: Negative for itching and visual disturbance.  Respiratory: Negative for cough.   Cardiovascular: Negative for chest pain, palpitations and leg swelling.  Gastrointestinal: Negative for abdominal distention, blood in stool, diarrhea and nausea.  Genitourinary: Negative for frequency and hematuria.  Musculoskeletal: Positive for arthralgias. Negative for back pain, gait problem, joint swelling and neck pain.  Skin: Positive  for color change and rash.  Neurological: Negative for dizziness, tremors, speech difficulty and weakness.  Psychiatric/Behavioral: Negative for agitation, dysphoric mood and sleep disturbance. The patient is not nervous/anxious.     Objective:  BP 130/90 (BP Location: Left Arm)   Pulse 93   Temp 98.1 F (36.7 C) (Oral)   Wt (!) 371 lb 12.8 oz (168.6 kg)   SpO2 96%   BMI 53.35 kg/m   BP Readings from Last 3 Encounters:  07/30/20 130/90  07/23/20 140/72  06/15/20 136/90    Wt Readings from Last 3 Encounters:  07/30/20 (!) 371 lb 12.8 oz (168.6 kg)  07/23/20 (!) 373 lb 3.2 oz (169.3 kg)  06/11/20 (!) 362 lb (164.2 kg)    Physical Exam Constitutional:      General: He is not in acute distress.    Appearance: He is well-developed. He is obese.     Comments: NAD  Eyes:     Conjunctiva/sclera: Conjunctivae normal.     Pupils: Pupils are equal, round, and reactive to light.  Neck:     Thyroid: No thyromegaly.     Vascular: No JVD.  Cardiovascular:     Rate and Rhythm: Normal rate and regular rhythm.     Heart sounds: Normal heart sounds. No murmur heard.  No friction rub. No gallop.   Pulmonary:     Effort: Pulmonary effort is normal. No respiratory distress.     Breath sounds: Normal breath sounds. No wheezing or rales.  Chest:  Chest wall: No tenderness.  Abdominal:     General: Bowel sounds are normal. There is no distension.     Palpations: Abdomen is soft. There is no mass.     Tenderness: There is no abdominal tenderness. There is no guarding or rebound.  Musculoskeletal:        General: No tenderness. Normal range of motion.     Cervical back: Normal range of motion.     Right lower leg: Edema present.     Left lower leg: Edema present.  Lymphadenopathy:     Cervical: No cervical adenopathy.  Skin:    General: Skin is warm and dry.     Findings: Rash present.  Neurological:     Mental Status: He is alert and oriented to person, place, and time.      Cranial Nerves: No cranial nerve deficit.     Motor: No abnormal muscle tone.     Coordination: Coordination normal.     Gait: Gait abnormal.     Deep Tendon Reflexes: Reflexes are normal and symmetric.  Psychiatric:        Behavior: Behavior normal.        Thought Content: Thought content normal.        Judgment: Judgment normal.    Stasis dermatitis B LEs Lab Results  Component Value Date   WBC 4.3 06/13/2020   HGB 12.7 (L) 06/13/2020   HCT 38.2 (L) 06/13/2020   PLT 117 (L) 06/13/2020   GLUCOSE 108 (H) 06/13/2020   CHOL 137 06/14/2008   TRIG 69 06/14/2008   HDL 34.5 (L) 06/14/2008   LDLCALC 89 06/14/2008   ALT 21 06/11/2020   AST 22 06/11/2020   NA 138 06/13/2020   K 3.8 06/13/2020   CL 107 06/13/2020   CREATININE 1.16 06/13/2020   BUN 13 06/13/2020   CO2 22 06/13/2020   TSH 0.88 04/29/2020   INR 1.2 04/14/2020   HGBA1C 5.7 (H) 04/15/2020    No results found.  Assessment & Plan:   There are no diagnoses linked to this encounter.   No orders of the defined types were placed in this encounter.    Follow-up: No follow-ups on file.  Sonda Primes, MD

## 2020-07-30 NOTE — Assessment & Plan Note (Signed)
Labs

## 2020-07-30 NOTE — Assessment & Plan Note (Signed)
Vit D 

## 2020-07-30 NOTE — Assessment & Plan Note (Signed)
  On diet  

## 2020-07-30 NOTE — Assessment & Plan Note (Signed)
Recurrent Treat dermatitis - venous stasis Keflex Rx

## 2020-07-30 NOTE — Assessment & Plan Note (Signed)
Vit B12 

## 2020-07-30 NOTE — Assessment & Plan Note (Signed)
Lasix Diltiazem, Toprol Eliquis

## 2020-09-10 ENCOUNTER — Other Ambulatory Visit: Payer: Self-pay | Admitting: Cardiology

## 2020-09-10 DIAGNOSIS — I4819 Other persistent atrial fibrillation: Secondary | ICD-10-CM

## 2020-09-10 DIAGNOSIS — G4733 Obstructive sleep apnea (adult) (pediatric): Secondary | ICD-10-CM

## 2020-10-22 NOTE — Progress Notes (Signed)
 HPI Never smoker followed for OSA, complicated by Chronic Venous Insufficiency, Permanent AFib/ Eliquis , HTN, CM/CHF, DM, Morbid Obesity NPSG 05/06/20- AHI 22.4/ hr, desaturation to 67%,  CPAP to 12, body weight 366 lbs  ======================================================   07/23/20-       Chronic Venous Insufficiency, Permanent AFib/ Eliquis , HTN, CM/CHF, DM, Morbid Obesity NPSG 05/06/20- AHI 22.4/ hr, desaturation to 67%,  CPAP to 12, body weight 366 lbs Hosp in September for cellulitis of R leg Body weight today- 373 lbs Covid  vax -2 Phizer Flu vax- declines We reviewed sleep study results and discussed CPAP as best initial option for him. He remains in permanent AFib. Says leg has healed. CTa chest 04/14/20- IMPRESSION: 1. No acute abnormality. 2. Technically borderline study without definite pulmonary emboli. 3. Cardiomegaly.  10/23/20-  Patient arrived but still doesn't have machine and denies other issues. We will contact [employer] and reschedule this visit.   3 yoM never smoker followed for OSA, complicated by Chronic Venous Insufficiency, Permanent AFib/ Eliquis , HTN, CM/CHF, DM, Morbid Obesity, Cellulitis R leg,  CPAP 5-15/ [employer]           -new order 07/23/20 Download- Body weight today- Covid vax- Flu vax   ROS-see HPI   + = positive Constitutional:    weight loss, night sweats, fevers, chills, fatigue, lassitude. HEENT:    headaches, difficulty swallowing, tooth/dental problems, sore throat,       sneezing, itching, ear ache, nasal congestion, post nasal drip, snoring CV:    chest pain, orthopnea, PND, +swelling in lower extremities, anasarca,                                  dizziness, +palpitations Resp:   +shortness of breath with exertion or at rest.                productive cough,   non-productive cough, coughing up of blood.              change in color of mucus.  wheezing.   Skin:    rash or lesions. GI:  No-   heartburn, indigestion, abdominal pain, nausea,  vomiting, diarrhea,                 change in bowel habits, loss of appetite GU: dysuria, change in color of urine, no urgency or frequency.   flank pain. MS:   joint pain, stiffness, decreased range of motion, back pain. Neuro-     nothing unusual Psych:  change in mood or affect.  depression or anxiety.   memory loss.  OBJ- Physical Exam General- Alert, Oriented, Affect-appropriate, Distress- none acute,  + morbidly obese Skin- rash-none, lesions- none, excoriation- none Lymphadenopathy- none Head- atraumatic            Eyes- Gross vision intact, PERRLA, conjunctivae and secretions clear            Ears- Hearing, canals-normal            Nose- Clear, no-Septal dev, mucus, polyps, erosion, perforation             Throat- Mallampati III_IV , mucosa clear , drainage- none, tonsils- atrophic,  + teeth Neck- flexible , trachea midline, no stridor , thyroid  nl, carotid no bruit Chest - symmetrical excursion , unlabored           Heart/CV- +IRR/ AFib , no murmur , no gallop  , no rub, nl  s1 s2                           - JVD+1 , edema+tight 4+, stasis changes- none, varices- none           Lung- clear to P&A, wheeze- none, cough- none , dullness-none, rub- none           Chest wall-  Abd-  Br/ Gen/ Rectal- Not done, not indicated Extrem- cyanosis- none, clubbing, none, atrophy- none, strength- nl Neuro- grossly intact to observation

## 2020-10-24 ENCOUNTER — Encounter: Payer: BC Managed Care – PPO | Admitting: Internal Medicine

## 2020-10-24 ENCOUNTER — Telehealth: Payer: Self-pay | Admitting: Internal Medicine

## 2020-10-24 ENCOUNTER — Other Ambulatory Visit: Payer: Self-pay

## 2020-10-24 NOTE — Telephone Encounter (Signed)
Patient had a 3 month f/u with Dr. Maple Hudson today to review his CPAP compliance. Patient has yet to receive his machine despite the order being placed 3 months ago. Patient stated that he has called [employer] for updates but was told that they did not have any machines in stock and was not given a time table on when the machine would be available. I offered to call [employer] for him, he agreed.   He has been rescheduled for 01/22/21 at 1030 with Dr. Maple Hudson.   Called [facility] at [phone] but was on hold for over 10 minutes. Called back and left a detailed message for Nida Boatman to give our office a call. Will leave this encounter open for follow up.

## 2020-10-29 ENCOUNTER — Other Ambulatory Visit: Payer: Self-pay

## 2020-10-30 ENCOUNTER — Encounter: Payer: Self-pay | Admitting: Internal Medicine

## 2020-10-30 ENCOUNTER — Ambulatory Visit (INDEPENDENT_AMBULATORY_CARE_PROVIDER_SITE_OTHER): Payer: BC Managed Care – PPO | Admitting: Internal Medicine

## 2020-10-30 DIAGNOSIS — I420 Dilated cardiomyopathy: Secondary | ICD-10-CM

## 2020-10-30 DIAGNOSIS — E119 Type 2 diabetes mellitus without complications: Secondary | ICD-10-CM | POA: Diagnosis not present

## 2020-10-30 DIAGNOSIS — I4821 Permanent atrial fibrillation: Secondary | ICD-10-CM

## 2020-10-30 DIAGNOSIS — I10 Essential (primary) hypertension: Secondary | ICD-10-CM | POA: Diagnosis not present

## 2020-10-30 DIAGNOSIS — E559 Vitamin D deficiency, unspecified: Secondary | ICD-10-CM

## 2020-10-30 LAB — CBC WITH DIFFERENTIAL/PLATELET
Basophils Absolute: 0 10*3/uL (ref 0.0–0.1)
Basophils Relative: 0.7 % (ref 0.0–3.0)
Eosinophils Absolute: 0.1 10*3/uL (ref 0.0–0.7)
Eosinophils Relative: 1.6 % (ref 0.0–5.0)
HCT: 42.1 % (ref 39.0–52.0)
Hemoglobin: 14.2 g/dL (ref 13.0–17.0)
Lymphocytes Relative: 14.4 % (ref 12.0–46.0)
Lymphs Abs: 0.8 10*3/uL (ref 0.7–4.0)
MCHC: 33.8 g/dL (ref 30.0–36.0)
MCV: 87.2 fl (ref 78.0–100.0)
Monocytes Absolute: 0.5 10*3/uL (ref 0.1–1.0)
Monocytes Relative: 10.2 % (ref 3.0–12.0)
Neutro Abs: 3.8 10*3/uL (ref 1.4–7.7)
Neutrophils Relative %: 73.1 % (ref 43.0–77.0)
Platelets: 171 10*3/uL (ref 150.0–400.0)
RBC: 4.82 Mil/uL (ref 4.22–5.81)
RDW: 13.7 % (ref 11.5–15.5)
WBC: 5.3 10*3/uL (ref 4.0–10.5)

## 2020-10-30 LAB — HEMOGLOBIN A1C: Hgb A1c MFr Bld: 6 % (ref 4.6–6.5)

## 2020-10-30 LAB — COMPREHENSIVE METABOLIC PANEL
ALT: 15 U/L (ref 0–53)
AST: 17 U/L (ref 0–37)
Albumin: 4 g/dL (ref 3.5–5.2)
Alkaline Phosphatase: 70 U/L (ref 39–117)
BUN: 16 mg/dL (ref 6–23)
CO2: 30 mEq/L (ref 19–32)
Calcium: 9 mg/dL (ref 8.4–10.5)
Chloride: 105 mEq/L (ref 96–112)
Creatinine, Ser: 1.07 mg/dL (ref 0.40–1.50)
GFR: 79.29 mL/min (ref >60.00)
Glucose, Bld: 96 mg/dL (ref 70–99)
Potassium: 4 mEq/L (ref 3.5–5.1)
Sodium: 142 mEq/L (ref 135–145)
Total Bilirubin: 0.8 mg/dL (ref 0.2–1.2)
Total Protein: 7.1 g/dL (ref 6.0–8.3)

## 2020-10-30 LAB — TSH: TSH: 1.49 u[IU]/mL (ref 0.35–4.50)

## 2020-10-30 NOTE — Assessment & Plan Note (Signed)
Check A1c. 

## 2020-10-30 NOTE — Assessment & Plan Note (Signed)
Cont w/Lasix, Diltiazem, Toprol, Eliquis

## 2020-10-30 NOTE — Assessment & Plan Note (Signed)
Vit D 

## 2020-10-30 NOTE — Assessment & Plan Note (Signed)
Cont w/Diltiazem, Toprol, Eliquis

## 2020-10-30 NOTE — Progress Notes (Signed)
Subjective:  Patient ID: Dakota Hernandez, male    DOB: [birth year]  Age: 55 y.o. MRN: [id number]  CC: Follow-up (3 month f/u)   HPI Luella Cook Heward presents for B12 def, anticoagulation, HTN, A fib  Outpatient Medications Prior to Visit  Medication Sig Dispense Refill  . acetaminophen (TYLENOL) 500 MG tablet Take 1,000 mg by mouth every 6 (six) hours as needed for mild pain or fever.    [name] apixaban (ELIQUIS) 5 MG TABS tablet Take 1 tablet (5 mg total) by mouth 2 (two) times daily. 60 tablet 6  . lisinopril (ZESTRIL) 20 MG tablet TAKE 1 TABLET BY MOUTH  DAILY 90 tablet 3  . metoprolol (TOPROL-XL) 200 MG 24 hr tablet TAKE 1 TABLET BY MOUTH  DAILY 90 tablet 3  . omeprazole (PRILOSEC OTC) 20 MG tablet Take 20 mg by mouth daily before breakfast.     . triamcinolone cream (KENALOG) 0.1 % Apply 1 application topically 2 (two) times daily. 160 g 3  . diltiazem (CARDIZEM CD) 120 MG 24 hr capsule Take 1 capsule (120 mg total) by mouth daily. 90 capsule 3  . furosemide (LASIX) 20 MG tablet Take 1 tablet (20 mg total) by mouth daily. 90 tablet 3  . cephALEXin (KEFLEX) 500 MG capsule Take 1 capsule (500 mg total) by mouth 4 (four) times daily. (Patient not taking: Reported on 10/30/2020) 40 capsule 0   No facility-administered medications prior to visit.    ROS: Review of Systems  Constitutional: Positive for fatigue and unexpected weight change. Negative for appetite change.  HENT: Negative for congestion, nosebleeds, sneezing, sore throat and trouble swallowing.   Eyes: Negative for itching and visual disturbance.  Respiratory: Positive for shortness of breath. Negative for cough.   Cardiovascular: Positive for leg swelling. Negative for chest pain and palpitations.  Gastrointestinal: Negative for abdominal distention, blood in stool, diarrhea and nausea.  Genitourinary: Negative for frequency and hematuria.  Musculoskeletal: Positive for back pain. Negative for gait problem, joint  swelling and neck pain.  Skin: Positive for color change. Negative for rash.  Neurological: Negative for dizziness, tremors, speech difficulty and weakness.  Psychiatric/Behavioral: Negative for agitation, dysphoric mood and sleep disturbance. The patient is not nervous/anxious.     Objective:  BP 130/82 (BP Location: Left Arm)   Pulse 77   Temp 97.9 F (36.6 C) (Oral)   Ht 5\' 10"  (1.778 m)   Wt (!) 379 lb (171.9 kg)   SpO2 95%   BMI 54.38 kg/m   BP Readings from Last 3 Encounters:  10/30/20 130/82  07/30/20 130/90  07/23/20 140/72    Wt Readings from Last 3 Encounters:  10/30/20 (!) 379 lb (171.9 kg)  10/24/20 (!) 379 lb 6.4 oz (172.1 kg)  07/30/20 (!) 371 lb 12.8 oz (168.6 kg)    Physical Exam Constitutional:      General: He is not in acute distress.    Appearance: He is well-developed. He is obese.     Comments: NAD  HENT:     Mouth/Throat:     Mouth: Oropharynx is clear and moist.  Eyes:     Conjunctiva/sclera: Conjunctivae normal.     Pupils: Pupils are equal, round, and reactive to light.  Neck:     Thyroid: No thyromegaly.     Vascular: No JVD.  Cardiovascular:     Rate and Rhythm: Normal rate and regular rhythm.     Pulses: Intact distal pulses.     Heart sounds: Normal heart  sounds. No murmur heard. No friction rub. No gallop.   Pulmonary:     Effort: Pulmonary effort is normal. No respiratory distress.     Breath sounds: Normal breath sounds. No wheezing or rales.  Chest:     Chest wall: No tenderness.  Abdominal:     General: Bowel sounds are normal. There is no distension.     Palpations: Abdomen is soft. There is no mass.     Tenderness: There is no abdominal tenderness. There is no guarding or rebound.  Musculoskeletal:        General: No tenderness. Normal range of motion.     Cervical back: Normal range of motion.     Right lower leg: Edema present.     Left lower leg: Edema present.  Lymphadenopathy:     Cervical: No cervical  adenopathy.  Skin:    General: Skin is warm and dry.     Findings: No rash.  Neurological:     Mental Status: He is alert and oriented to person, place, and time.     Cranial Nerves: No cranial nerve deficit.     Motor: No abnormal muscle tone.     Coordination: He displays a negative Romberg sign. Coordination normal.     Gait: Gait normal.     Deep Tendon Reflexes: Reflexes are normal and symmetric.  Psychiatric:        Mood and Affect: Mood and affect normal.        Behavior: Behavior normal.        Thought Content: Thought content normal.        Judgment: Judgment normal.   purple ankles, trace edema B  Lab Results  Component Value Date   WBC 4.3 06/13/2020   HGB 12.7 (L) 06/13/2020   HCT 38.2 (L) 06/13/2020   PLT 117 (L) 06/13/2020   GLUCOSE 108 (H) 06/13/2020   CHOL 137 06/14/2008   TRIG 69 06/14/2008   HDL 34.5 (L) 06/14/2008   LDLCALC 89 06/14/2008   ALT 21 06/11/2020   AST 22 06/11/2020   NA 138 06/13/2020   K 3.8 06/13/2020   CL 107 06/13/2020   CREATININE 1.16 06/13/2020   BUN 13 06/13/2020   CO2 22 06/13/2020   TSH 0.88 04/29/2020   INR 1.2 04/14/2020   HGBA1C 5.7 (H) 04/15/2020    No results found.  Assessment & Plan:    Sonda Primes, MD

## 2020-10-30 NOTE — Assessment & Plan Note (Signed)
Cont w/Lasix, Diltiazem, Toprol

## 2020-10-30 NOTE — Assessment & Plan Note (Addendum)
S/p gastric sleeve procedure - 8/13 BMI 54 S/p gastric sleeve procedure - 8/13 [facility] referral

## 2020-12-02 ENCOUNTER — Other Ambulatory Visit: Payer: Self-pay | Admitting: Cardiology

## 2020-12-02 DIAGNOSIS — I4891 Unspecified atrial fibrillation: Secondary | ICD-10-CM

## 2021-01-15 ENCOUNTER — Other Ambulatory Visit: Payer: Self-pay | Admitting: Cardiology

## 2021-01-15 DIAGNOSIS — R601 Generalized edema: Secondary | ICD-10-CM

## 2021-01-22 ENCOUNTER — Ambulatory Visit: Payer: BC Managed Care – PPO | Admitting: Internal Medicine

## 2021-01-28 ENCOUNTER — Encounter (INDEPENDENT_AMBULATORY_CARE_PROVIDER_SITE_OTHER): Payer: Self-pay | Admitting: Family Medicine

## 2021-01-28 ENCOUNTER — Ambulatory Visit (INDEPENDENT_AMBULATORY_CARE_PROVIDER_SITE_OTHER): Payer: BC Managed Care – PPO | Admitting: Family Medicine

## 2021-01-28 ENCOUNTER — Other Ambulatory Visit: Payer: Self-pay

## 2021-01-28 VITALS — BP 134/86 | HR 67 | Temp 97.1°F | Ht 69.0 in | Wt 382.0 lb

## 2021-01-28 DIAGNOSIS — I4821 Permanent atrial fibrillation: Secondary | ICD-10-CM

## 2021-01-28 DIAGNOSIS — E559 Vitamin D deficiency, unspecified: Secondary | ICD-10-CM

## 2021-01-28 DIAGNOSIS — Z9189 Other specified personal risk factors, not elsewhere classified: Secondary | ICD-10-CM

## 2021-01-28 DIAGNOSIS — R7303 Prediabetes: Secondary | ICD-10-CM | POA: Diagnosis not present

## 2021-01-28 DIAGNOSIS — Z6841 Body Mass Index (BMI) 40.0 and over, adult: Secondary | ICD-10-CM

## 2021-01-28 DIAGNOSIS — Z1331 Encounter for screening for depression: Secondary | ICD-10-CM | POA: Diagnosis not present

## 2021-01-28 DIAGNOSIS — R0602 Shortness of breath: Secondary | ICD-10-CM

## 2021-01-28 DIAGNOSIS — Z0289 Encounter for other administrative examinations: Secondary | ICD-10-CM

## 2021-01-28 DIAGNOSIS — R5383 Other fatigue: Secondary | ICD-10-CM

## 2021-01-28 DIAGNOSIS — G4733 Obstructive sleep apnea (adult) (pediatric): Secondary | ICD-10-CM

## 2021-01-28 DIAGNOSIS — E538 Deficiency of other specified B group vitamins: Secondary | ICD-10-CM

## 2021-01-28 DIAGNOSIS — Z9884 Bariatric surgery status: Secondary | ICD-10-CM

## 2021-01-28 NOTE — Progress Notes (Signed)
Dear Dr. Alain Marion,   Thank you for referring Dakota Hernandez to our clinic. The following note includes my evaluation and treatment recommendations.  Chief Complaint:   OBESITY Chester Sibert (MR# [id number]) is a 55 y.o. male who presents for evaluation and treatment of obesity and related comorbidities. Current BMI is Body mass index is 56.41 kg/m. Dakota Hernandez has been struggling with his weight for many years and has been unsuccessful in either losing weight, maintaining weight loss, or reaching his healthy weight goal.  Dakota Hernandez is currently in the action stage of change and ready to dedicate time achieving and maintaining a healthier weight. Dakota Hernandez is interested in becoming our patient and working on intensive lifestyle modifications including (but not limited to) diet and exercise for weight loss.  Dakota Hernandez was referred by Dr. Alain Marion. History of sleeve gastrectomy 5-10 years ago. Eating out 9-12 times a week [employer] in AM chicken, eggs, hash brown, sweet tea (eat all and drink sweet tea throughout the day); snack- 2.5-3 hour later bag of crackers + 10 oz Hawaiian punch (sometimes hungry); Lunch- 2-3 PM sandwich from home bologna (1 slice) 2 slices cheese, 2 slice ham + handful of chips (satisfied); 5-5:15 PM water + chocolate (desire to eat); Dinner [employer] #2 double cheeseburger (eats half) + tots (eats all); After dinner- grazing- twinkies or chocolate.    [name]'s habits were reviewed today and are as follows: his desired weight loss is 167 lbs, he has been heavy most of his life, he started gaining weight when his wife left 6 years ago, his heaviest weight ever was 382 pounds, he is a picky eater and doesn't like to eat healthier foods, he has significant food cravings issues, he snacks frequently in the evenings, he is frequently drinking liquids with calories, he frequently makes poor food choices and he struggles with emotional eating.  Depression Screen [name]'s Food and  Mood (modified PHQ-9) score was 8.  Depression screen PHQ 2/9 01/28/2021  Decreased Interest 1  Down, Depressed, Hopeless 1  PHQ - 2 Score 2  Altered sleeping 1  Tired, decreased energy 2  Change in appetite 2  Feeling bad or failure about yourself  0  Trouble concentrating 1  Moving slowly or fidgety/restless 0  Suicidal thoughts 0  PHQ-9 Score 8  Difficult doing work/chores Not difficult at all   Subjective:   1. Other fatigue Dakota Hernandez admits to daytime somnolence and admits to waking up still tired. Patent has a history of symptoms of daytime fatigue and morning fatigue. Dakota Hernandez generally gets 5 or 6 hours of sleep per night, and states that he has poor sleep quality. Snoring is present. Apneic episodes are present. Epworth Sleepiness Score is 8. Atrial Fibrillation (irreg irreg) rate 106.  2. SOB (shortness of breath) on exertion Dakota Hernandez notes increasing shortness of breath with exercising and seems to be worsening over time with weight gain. He notes getting out of breath sooner with activity than he used to. This has gotten worse recently. Dakota Hernandez denies shortness of breath at rest or orthopnea. Atrial Fibrillation (irreg irreg) rate 106.  3. Atrial fibrillation EKG showing A Fib. [name] was diagnosed ~10-12 years ago. Cardioverted 4-5 times in the past. He is on diltiazem and metoprolol.  4. Pre-diabetes Dakota Hernandez has an A1c of 6.0. He is not on medication.  5. Vitamin D deficiency disease Dakota Hernandez's last Vit D level was 14. He is not on a Vit D supplement. Pt reports fatigue.  6. Vitamin B12 deficiency  Dakota Hernandez's last Vit B12 level was within normal limits. He reports fatigue.  7. OSA (obstructive sleep apnea) [name] had a sleep test August 2021. He met with Dr. Annamaria Boots since that time. He is waiting for CPAP (supply issues).  8. H/O bariatric surgery Dakota Hernandez had sleeve gastrectomy 5-10 years ago. He lost down to 260 over 7-8 months.   9. At risk for diabetes mellitus Dakota Hernandez is at  higher than average risk for developing diabetes due to obesity.   Assessment/Plan:   1. Other fatigue Dakota Hernandez does feel that his weight is causing his energy to be lower than it should be. Fatigue may be related to obesity, depression or many other causes. Labs will be ordered, and in the meanwhile, Dakota Hernandez will focus on self care including making healthy food choices, increasing physical activity and focusing on stress reduction. Check labs today.  - TSH - Folate - T3 - T4 - EKG 12-Lead  2. SOB (shortness of breath) on exertion Dakota Hernandez does feel that he gets out of breath more easily that he used to when he exercises. Dakota Hernandez's shortness of breath appears to be obesity related and exercise induced. He has agreed to work on weight loss and gradually increase exercise to treat his exercise induced shortness of breath. Will continue to monitor closely. Check labs today.  - Lipid Panel With LDL/HDL Ratio - CBC With Differential  3. Atrial fibrillation Follow up with Dr. Stanford Breed.  4. Pre-diabetes Dakota Hernandez will continue to work on weight loss, exercise, and decreasing simple carbohydrates to help decrease the risk of diabetes. Check labs today.  - Insulin, random - Comprehensive metabolic panel - Hemoglobin A1c  5. Vitamin D deficiency disease Low Vitamin D level contributes to fatigue and are associated with obesity, breast, and colon cancer. He agrees to follow-up for routine testing of Vitamin D, at least 2-3 times per year to avoid over-replacement. Check labs today.  - VITAMIN D 25 Hydroxy (Vit-D Deficiency, Fractures)  6. Vitamin B12 deficiency The diagnosis was reviewed with the patient. Counseling provided today, see below. We will continue to monitor. Orders and follow up as documented in patient record. Check labs today.  Counseling . The body needs vitamin B12: to make red blood cells; to make DNA; and to help the nerves work properly so they can carry messages from the brain  to the body.  . The main causes of vitamin B12 deficiency include dietary deficiency, digestive diseases, pernicious anemia, and having a surgery in which part of the stomach or small intestine is removed.  . Certain medicines can make it harder for the body to absorb vitamin B12. These medicines include: heartburn medications; some antibiotics; some medications used to treat diabetes, gout, and high cholesterol.  . In some cases, there are no symptoms of this condition. If the condition leads to anemia or nerve damage, various symptoms can occur, such as weakness or fatigue, shortness of breath, and numbness or tingling in your hands and feet.   . Treatment:  o May include taking vitamin B12 supplements.  o Avoid alcohol.  o Eat lots of healthy foods that contain vitamin B12: - Beef, pork, chicken, [place], and organ meats, such as liver.  - Seafood: This includes clams, rainbow trout, salmon, tuna, and haddock. Eggs.  - Cereal and dairy products that are fortified: This means that vitamin B12 has been added to the food.  - Vitamin B12  7. OSA (obstructive sleep apnea) Intensive lifestyle modifications are the first line treatment for  this issue. We discussed several lifestyle modifications today and he will continue to work on diet, exercise and weight loss efforts. We will continue to monitor. Orders and follow up as documented in patient record. Follow up at next appointment.  8. H/O bariatric surgery Check labs today.  - Prealbumin  9. Screening for depression Zamarian had a positive depression screening. Depression is commonly associated with obesity and often results in emotional eating behaviors. We will monitor this closely and work on CBT to help improve the non-hunger eating patterns. Referral to Psychology may be required if no improvement is seen as he continues in our clinic.  10. At risk for diabetes mellitus Colden was given approximately 15 minutes of diabetes education and  counseling today. We discussed intensive lifestyle modifications today with an emphasis on weight loss as well as increasing exercise and decreasing simple carbohydrates in his diet. We also reviewed medication options with an emphasis on risk versus benefit of those discussed.   Repetitive spaced learning was employed today to elicit superior memory formation and behavioral change.  11. Class 3 severe obesity due to excess calories with serious comorbidity and body mass index (BMI) of 50.0 to 59.9 in adult [facility]) Finneas is currently in the action stage of change and his goal is to continue with weight loss efforts. I recommend Ausencio begin the structured treatment plan as follows:  He has agreed to the Category 4 Plan and keeping a food journal and adhering to recommended goals of 600-800 calories and 55+ grams protein with supper.  Exercise goals: No exercise has been prescribed at this time.   Behavioral modification strategies: increasing lean protein intake, meal planning and cooking strategies, keeping healthy foods in the home and planning for success.  He was informed of the importance of frequent follow-up visits to maximize his success with intensive lifestyle modifications for his multiple health conditions. He was informed we would discuss his lab results at his next visit unless there is a critical issue that needs to be addressed sooner. Sherwood agreed to keep his next visit at the agreed upon time to discuss these results.  Objective:   Blood pressure 134/86, pulse 67, temperature (!) 97.1 F (36.2 C), height '5\' 9"'  (1.753 m), weight (!) 382 lb (173.3 kg), SpO2 96 %. Body mass index is 56.41 kg/m.  EKG: Atrial Fibrillation (irreg irreg) rate 106.  Indirect Calorimeter completed today shows a VO2 of 427 and a REE of 2973.  His calculated basal metabolic rate is 8916 thus his basal metabolic rate is better than expected.  General: Cooperative, alert, well developed, in no acute  distress. HEENT: Conjunctivae and lids unremarkable. Cardiovascular: Regular rhythm.  Lungs: Normal work of breathing. Neurologic: No focal deficits.   Lab Results  Component Value Date   CREATININE 1.07 10/30/2020   BUN 16 10/30/2020   NA 142 10/30/2020   K 4.0 10/30/2020   CL 105 10/30/2020   CO2 30 10/30/2020   Lab Results  Component Value Date   ALT 15 10/30/2020   AST 17 10/30/2020   ALKPHOS 70 10/30/2020   BILITOT 0.8 10/30/2020   Lab Results  Component Value Date   HGBA1C 6.0 10/30/2020   HGBA1C 5.7 (H) 04/15/2020   No results found for: INSULIN Lab Results  Component Value Date   TSH 1.49 10/30/2020   Lab Results  Component Value Date   CHOL 137 06/14/2008   HDL 34.5 (L) 06/14/2008   LDLCALC 89 06/14/2008   TRIG 69  06/14/2008   CHOLHDL 4.0 CALC 06/14/2008   Lab Results  Component Value Date   WBC 5.3 10/30/2020   HGB 14.2 10/30/2020   HCT 42.1 10/30/2020   MCV 87.2 10/30/2020   PLT 171.0 10/30/2020    Attestation Statements:   Reviewed by clinician on day of visit: allergies, medications, problem list, medical history, surgical history, family history, social history, and previous encounter notes.  Coral Ceo, am acting as transcriptionist for Coralie Common, MD.   This is the patient's first visit at [facility]. The patient's NEW PATIENT PACKET was reviewed at length. Included in the packet: current and past health history, medications, allergies, ROS, gynecologic history (women only), surgical history, family history, social history, weight history, weight loss surgery history (for those that have had weight loss surgery), nutritional evaluation, mood and food questionnaire, PHQ9, Epworth questionnaire, sleep habits questionnaire, patient life and health improvement goals questionnaire. These will all be scanned into the patient's chart under media.   During the visit, I independently reviewed the patient's EKG,  bioimpedance scale results, and indirect calorimeter results. I used this information to tailor a meal plan for the patient that will help him to lose weight and will improve his obesity-related conditions going forward. I performed a medically necessary appropriate examination and/or evaluation. I discussed the assessment and treatment plan with the patient. The patient was provided an opportunity to ask questions and all were answered. The patient agreed with the plan and demonstrated an understanding of the instructions. Labs were ordered at this visit and will be reviewed at the next visit unless more critical results need to be addressed immediately. Clinical information was updated and documented in the EMR.   Time spent on visit including pre-visit chart review and post-visit care was 45 minutes.   A separate 15 minutes was spent on risk counseling (see above).  I have reviewed the above documentation for accuracy and completeness, and I agree with the above. - Jinny Blossom, MD

## 2021-01-29 LAB — COMPREHENSIVE METABOLIC PANEL
ALT: 17 IU/L (ref 0–44)
AST: 20 IU/L (ref 0–40)
Albumin/Globulin Ratio: 2 (ref 1.2–2.2)
Albumin: 4.2 g/dL (ref 3.8–4.9)
Alkaline Phosphatase: 84 IU/L (ref 44–121)
BUN/Creatinine Ratio: 14 (ref 9–20)
BUN: 15 mg/dL (ref 6–24)
Bilirubin Total: 1 mg/dL (ref 0.0–1.2)
CO2: 23 mmol/L (ref 20–29)
Calcium: 8.7 mg/dL (ref 8.7–10.2)
Chloride: 105 mmol/L (ref 96–106)
Creatinine, Ser: 1.04 mg/dL (ref 0.76–1.27)
Globulin, Total: 2.1 g/dL (ref 1.5–4.5)
Glucose: 91 mg/dL (ref 65–99)
Potassium: 4.2 mmol/L (ref 3.5–5.2)
Sodium: 143 mmol/L (ref 134–144)
Total Protein: 6.3 g/dL (ref 6.0–8.5)
eGFR: 86 mL/min/{1.73_m2} (ref >59)

## 2021-01-29 LAB — CBC WITH DIFFERENTIAL
Basophils Absolute: 0 10*3/uL (ref 0.0–0.2)
Basos: 1 %
EOS (ABSOLUTE): 0.1 10*3/uL (ref 0.0–0.4)
Eos: 2 %
Hematocrit: 42 % (ref 37.5–51.0)
Hemoglobin: 13.8 g/dL (ref 13.0–17.7)
Immature Grans (Abs): 0 10*3/uL (ref 0.0–0.1)
Immature Granulocytes: 1 %
Lymphocytes Absolute: 0.9 10*3/uL (ref 0.7–3.1)
Lymphs: 17 %
MCH: 28.8 pg (ref 26.6–33.0)
MCHC: 32.9 g/dL (ref 31.5–35.7)
MCV: 88 fL (ref 79–97)
Monocytes Absolute: 0.5 10*3/uL (ref 0.1–0.9)
Monocytes: 9 %
Neutrophils Absolute: 3.6 10*3/uL (ref 1.4–7.0)
Neutrophils: 70 %
RBC: 4.8 x10E6/uL (ref 4.14–5.80)
RDW: 13.8 % (ref 11.6–15.4)
WBC: 5.1 10*3/uL (ref 3.4–10.8)

## 2021-01-29 LAB — LIPID PANEL WITH LDL/HDL RATIO
Cholesterol, Total: 190 mg/dL (ref 100–199)
HDL: 49 mg/dL (ref >39)
LDL Chol Calc (NIH): 128 mg/dL — ABNORMAL HIGH (ref 0–99)
LDL/HDL Ratio: 2.6 ratio (ref 0.0–3.6)
Triglycerides: 72 mg/dL (ref 0–149)
VLDL Cholesterol Cal: 13 mg/dL (ref 5–40)

## 2021-01-29 LAB — PREALBUMIN: PREALBUMIN: 21 mg/dL (ref 10–36)

## 2021-01-29 LAB — VITAMIN D 25 HYDROXY (VIT D DEFICIENCY, FRACTURES): Vit D, 25-Hydroxy: 11 ng/mL — ABNORMAL LOW (ref 30.0–100.0)

## 2021-01-29 LAB — T4: T4, Total: 7.3 ug/dL (ref 4.5–12.0)

## 2021-01-29 LAB — T3: T3, Total: 155 ng/dL (ref 71–180)

## 2021-01-29 LAB — VITAMIN B12: Vitamin B-12: 178 pg/mL — ABNORMAL LOW (ref 232–1245)

## 2021-01-29 LAB — INSULIN, RANDOM: INSULIN: 17.7 u[IU]/mL (ref 2.6–24.9)

## 2021-01-29 LAB — TSH: TSH: 0.875 u[IU]/mL (ref 0.450–4.500)

## 2021-01-29 LAB — FOLATE: Folate: 8.2 ng/mL (ref >3.0)

## 2021-01-29 LAB — HEMOGLOBIN A1C
Est. average glucose Bld gHb Est-mCnc: 131 mg/dL
Hgb A1c MFr Bld: 6.2 % — ABNORMAL HIGH (ref 4.8–5.6)

## 2021-02-11 ENCOUNTER — Other Ambulatory Visit: Payer: Self-pay

## 2021-02-11 ENCOUNTER — Encounter (INDEPENDENT_AMBULATORY_CARE_PROVIDER_SITE_OTHER): Payer: Self-pay | Admitting: Family Medicine

## 2021-02-11 ENCOUNTER — Ambulatory Visit (INDEPENDENT_AMBULATORY_CARE_PROVIDER_SITE_OTHER): Payer: BC Managed Care – PPO | Admitting: Family Medicine

## 2021-02-11 VITALS — BP 113/73 | HR 94 | Temp 97.7°F | Ht 69.0 in | Wt 364.0 lb

## 2021-02-11 DIAGNOSIS — E538 Deficiency of other specified B group vitamins: Secondary | ICD-10-CM

## 2021-02-11 DIAGNOSIS — Z9189 Other specified personal risk factors, not elsewhere classified: Secondary | ICD-10-CM

## 2021-02-11 DIAGNOSIS — E559 Vitamin D deficiency, unspecified: Secondary | ICD-10-CM | POA: Diagnosis not present

## 2021-02-11 DIAGNOSIS — Z6841 Body Mass Index (BMI) 40.0 and over, adult: Secondary | ICD-10-CM

## 2021-02-11 DIAGNOSIS — Z9884 Bariatric surgery status: Secondary | ICD-10-CM | POA: Diagnosis not present

## 2021-02-11 DIAGNOSIS — E782 Mixed hyperlipidemia: Secondary | ICD-10-CM

## 2021-02-11 DIAGNOSIS — R7303 Prediabetes: Secondary | ICD-10-CM

## 2021-02-11 MED ORDER — VITAMIN B-12 1000 MCG PO TABS: 1000.0000 ug | ORAL_TABLET | Freq: Every day | ORAL | 0 refills | Status: DC

## 2021-02-11 MED ORDER — VITAMIN D (ERGOCALCIFEROL) 1.25 MG (50000 UNIT) PO CAPS: 50000.0000 [IU] | ORAL_CAPSULE | ORAL | 0 refills | Status: DC

## 2021-02-12 NOTE — Progress Notes (Signed)
Chief Complaint:   OBESITY Quanell is here to discuss his progress with his obesity treatment plan along with follow-up of his obesity related diagnoses. Daryle is on the Category 4 Plan and keeping a food journal and adhering to recommended goals of 600-800 calories and 55 g protein with supper and states he is following his eating plan approximately 60% of the time. Nicklas states he is not currently exercising.  Today's visit was #: 2 Starting weight: 382 lbs Starting date: 01/28/2021 Today's weight: 364 lbs Today's date: 02/11/2021 Total lbs lost to date: 18 Total lbs lost since last in-office visit: 18  Interim History: Shashwat has been working and trying to follow the meal plan. He has not had breakfast out and has made breakfast prior to work. He ate grilled chicken sandwich 3 times in the past 2 weeks. Pt hasn't had any chocolate or candy. He has been finishing toastees crackers for snacks. He wants to substitute fruit. He reports he felt quantity was sufficient. Hunger had decreased over the last 2 weeks. He is getting between 8-10 oz at dinner. He still has drive to snack after dinner.  Subjective:   1. Vitamin D deficiency disease Wane denies nausea, vomiting, and muscle weakness but notes fatigue. He is not on a Vit D supplement. His last Vit D level was 11.0.  2. History of bariatric surgery Lyal reports he is still able to get all food in. He experiencing occasional hunger.  3. Mixed hyperlipidemia LDL 128, HDL 49, and triglycerides 72. Neil is not on statin therapy.  4. Vitamin B12 deficiency Isidor is not on a B12 supplement. His B12 level is 178. He has a hx of bariatric surgery.  5. Pre-diabetes A1c 6.2 (previously 6.0) and insulin level 17.7. Salvator is not on medication.  6. At risk for diabetes mellitus Roshawn is at higher than average risk for developing diabetes due to obesity.   Assessment/Plan:   1. Vitamin D deficiency disease Low Vitamin D  level contributes to fatigue and are associated with obesity, breast, and colon cancer. He agrees to start to take prescription Vitamin D @50 ,000 IU twice a week and will follow-up for routine testing of Vitamin D, at least 2-3 times per year to avoid over-replacement.  - Vitamin D, Ergocalciferol, (DRISDOL) 1.25 MG (50000 UNIT) CAPS capsule; Take 1 capsule (50,000 Units total) by mouth 2 (two) times a week.  Dispense: 8 capsule; Refill: 0  2. History of bariatric surgery Follow hunger and food intake inability at next appointment.  3. Mixed hyperlipidemia Cardiovascular risk and specific lipid/LDL goals reviewed.  We discussed several lifestyle modifications today and Jamarcus will continue to work on diet, exercise and weight loss efforts. Orders and follow up as documented in patient record. Repeat labs in 3 months.  Counseling Intensive lifestyle modifications are the first line treatment for this issue. . Dietary changes: Increase soluble fiber. Decrease simple carbohydrates. . Exercise changes: Moderate to vigorous-intensity aerobic activity 150 minutes per week if tolerated. . Lipid-lowering medications: see documented in medical record.  4. Vitamin B12 deficiency The diagnosis was reviewed with the patient. Counseling provided today, see below. We will continue to monitor. Orders and follow up as documented in patient record. Start B12 1000 mcg PO daily, as prescribed below.  Counseling . The body needs vitamin B12: to make red blood cells; to make DNA; and to help the nerves work properly so they can carry messages from the brain to the body.  Jomarie Longs  The main causes of vitamin B12 deficiency include dietary deficiency, digestive diseases, pernicious anemia, and having a surgery in which part of the stomach or small intestine is removed.  . Certain medicines can make it harder for the body to absorb vitamin B12. These medicines include: heartburn medications; some antibiotics; some medications  used to treat diabetes, gout, and high cholesterol.  . In some cases, there are no symptoms of this condition. If the condition leads to anemia or nerve damage, various symptoms can occur, such as weakness or fatigue, shortness of breath, and numbness or tingling in your hands and feet.   . Treatment:  o May include taking vitamin B12 supplements.  o Avoid alcohol.  o Eat lots of healthy foods that contain vitamin B12: - Beef, pork, chicken, [place], and organ meats, such as liver.  - Seafood: This includes clams, rainbow trout, salmon, tuna, and haddock. Eggs.  - Cereal and dairy products that are fortified: This means that vitamin B12 has been added to the food.  - vitamin B-12 (CYANOCOBALAMIN) 1000 MCG tablet; Take 1 tablet (1,000 mcg total) by mouth daily.  Dispense: 90 tablet; Refill: 0  5. Pre-diabetes Kincade will continue to work on weight loss, exercise, and decreasing simple carbohydrates to help decrease the risk of diabetes. Repeat labs in 3 months  6. At risk for diabetes mellitus Blanchard was given approximately 30 minutes of diabetes education and counseling today. We discussed intensive lifestyle modifications today with an emphasis on weight loss as well as increasing exercise and decreasing simple carbohydrates in his diet. We also reviewed medication options with an emphasis on risk versus benefit of those discussed.   Repetitive spaced learning was employed today to elicit superior memory formation and behavioral change.  7. Class 3 severe obesity due to excess calories with serious comorbidity and body mass index (BMI) of 50.0 to 59.9 in adult [facility])  June is currently in the action stage of change. As such, his goal is to continue with weight loss efforts. He has agreed to the Category 4 Plan.   Exercise goals: No exercise has been prescribed at this time.  Behavioral modification strategies: increasing lean protein intake, meal planning and cooking strategies, keeping  healthy foods in the home and planning for success.  Kwabena has agreed to follow-up with our clinic in 2-3 weeks. He was informed of the importance of frequent follow-up visits to maximize his success with intensive lifestyle modifications for his multiple health conditions.   Objective:   Blood pressure 113/73, pulse 94, temperature 97.7 F (36.5 C), height 5\' 9"  (1.753 m), weight (!) 364 lb (165.1 kg), SpO2 97 %. Body mass index is 53.75 kg/m.  General: Cooperative, alert, well developed, in no acute distress. HEENT: Conjunctivae and lids unremarkable. Cardiovascular: Regular rhythm.  Lungs: Normal work of breathing. Neurologic: No focal deficits.   Lab Results  Component Value Date   CREATININE 1.04 01/28/2021   BUN 15 01/28/2021   NA 143 01/28/2021   K 4.2 01/28/2021   CL 105 01/28/2021   CO2 23 01/28/2021   Lab Results  Component Value Date   ALT 17 01/28/2021   AST 20 01/28/2021   ALKPHOS 84 01/28/2021   BILITOT 1.0 01/28/2021   Lab Results  Component Value Date   HGBA1C 6.2 (H) 01/28/2021   HGBA1C 6.0 10/30/2020   HGBA1C 5.7 (H) 04/15/2020   Lab Results  Component Value Date   INSULIN 17.7 01/28/2021   Lab Results  Component Value Date  TSH 0.875 01/28/2021   Lab Results  Component Value Date   CHOL 190 01/28/2021   HDL 49 01/28/2021   LDLCALC 128 (H) 01/28/2021   TRIG 72 01/28/2021   CHOLHDL 4.0 CALC 06/14/2008   Lab Results  Component Value Date   WBC 5.1 01/28/2021   HGB 13.8 01/28/2021   HCT 42.0 01/28/2021   MCV 88 01/28/2021   PLT 171.0 10/30/2020    Attestation Statements:   Reviewed by clinician on day of visit: allergies, medications, problem list, medical history, surgical history, family history, social history, and previous encounter notes.  Edmund Hilda, CMA, am acting as transcriptionist for Reuben Likes, MD.   I have reviewed the above documentation for accuracy and completeness, and I agree with the above. -  Katherina Mires, MD

## 2021-02-21 ENCOUNTER — Telehealth: Payer: Self-pay | Admitting: Internal Medicine

## 2021-02-21 NOTE — Telephone Encounter (Signed)
Spoke with pt who states he will not receive C-Pap until 03/12/21. Pt made aware C-Pap f/u OV will need to be scheduled 31- 90 days after start of C-Pap use. Pt states understanding but is unable to schedule appointment at this time. When pt calls back please schedule for OV with Dr. Maple Hudson 31-90 days after start of C-Pap use.

## 2021-02-21 NOTE — Telephone Encounter (Signed)
ATC pt to see if he has gotten C-Pap yet. Pt's phone number has no VM. Will attempt to reach pt at later time.

## 2021-02-24 ENCOUNTER — Ambulatory Visit: Payer: BC Managed Care – PPO | Admitting: Internal Medicine

## 2021-02-24 ENCOUNTER — Other Ambulatory Visit: Payer: Self-pay | Admitting: Cardiology

## 2021-02-24 DIAGNOSIS — I4891 Unspecified atrial fibrillation: Secondary | ICD-10-CM

## 2021-02-27 ENCOUNTER — Ambulatory Visit: Payer: BC Managed Care – PPO | Admitting: Internal Medicine

## 2021-03-05 ENCOUNTER — Other Ambulatory Visit: Payer: Self-pay

## 2021-03-05 ENCOUNTER — Ambulatory Visit (INDEPENDENT_AMBULATORY_CARE_PROVIDER_SITE_OTHER): Payer: BC Managed Care – PPO | Admitting: Family Medicine

## 2021-03-05 ENCOUNTER — Encounter (INDEPENDENT_AMBULATORY_CARE_PROVIDER_SITE_OTHER): Payer: Self-pay | Admitting: Family Medicine

## 2021-03-05 VITALS — BP 116/79 | HR 73 | Temp 97.6°F | Ht 69.0 in | Wt 352.0 lb

## 2021-03-05 DIAGNOSIS — E538 Deficiency of other specified B group vitamins: Secondary | ICD-10-CM | POA: Diagnosis not present

## 2021-03-05 DIAGNOSIS — Z6841 Body Mass Index (BMI) 40.0 and over, adult: Secondary | ICD-10-CM | POA: Diagnosis not present

## 2021-03-05 DIAGNOSIS — Z9189 Other specified personal risk factors, not elsewhere classified: Secondary | ICD-10-CM | POA: Diagnosis not present

## 2021-03-05 DIAGNOSIS — E559 Vitamin D deficiency, unspecified: Secondary | ICD-10-CM

## 2021-03-05 MED ORDER — VITAMIN D (ERGOCALCIFEROL) 1.25 MG (50000 UNIT) PO CAPS: 50000.0000 [IU] | ORAL_CAPSULE | ORAL | 0 refills | Status: DC

## 2021-03-07 ENCOUNTER — Other Ambulatory Visit: Payer: Self-pay

## 2021-03-07 ENCOUNTER — Encounter: Payer: Self-pay | Admitting: Internal Medicine

## 2021-03-07 ENCOUNTER — Ambulatory Visit (INDEPENDENT_AMBULATORY_CARE_PROVIDER_SITE_OTHER): Payer: BC Managed Care – PPO | Admitting: Internal Medicine

## 2021-03-07 VITALS — BP 118/80 | HR 100 | Temp 97.9°F | Ht 69.0 in | Wt 357.0 lb

## 2021-03-07 DIAGNOSIS — I10 Essential (primary) hypertension: Secondary | ICD-10-CM | POA: Diagnosis not present

## 2021-03-07 DIAGNOSIS — I4821 Permanent atrial fibrillation: Secondary | ICD-10-CM | POA: Diagnosis not present

## 2021-03-07 DIAGNOSIS — I420 Dilated cardiomyopathy: Secondary | ICD-10-CM | POA: Diagnosis not present

## 2021-03-07 DIAGNOSIS — E538 Deficiency of other specified B group vitamins: Secondary | ICD-10-CM

## 2021-03-07 DIAGNOSIS — E119 Type 2 diabetes mellitus without complications: Secondary | ICD-10-CM

## 2021-03-07 DIAGNOSIS — E559 Vitamin D deficiency, unspecified: Secondary | ICD-10-CM

## 2021-03-07 DIAGNOSIS — Z23 Encounter for immunization: Secondary | ICD-10-CM | POA: Diagnosis not present

## 2021-03-07 NOTE — Addendum Note (Signed)
Addended by: Argentina Ponder on: 03/07/2021 08:28 AM   Modules accepted: Orders

## 2021-03-07 NOTE — Assessment & Plan Note (Addendum)
Cont w/Diltiazem, Toprol, Lasix, Eliquis Cont w/wt loss Pneumovax advised Needs Prevnar, tDAP

## 2021-03-07 NOTE — Assessment & Plan Note (Signed)
Cont w/Diltiazem, Toprol, Lasix Cont w/wt loss

## 2021-03-07 NOTE — Assessment & Plan Note (Signed)
Cont w/wt loss 

## 2021-03-07 NOTE — Assessment & Plan Note (Addendum)
S/p gastric sleeve procedure - 8/13 Joe lost 30 lbs in 6 wks

## 2021-03-07 NOTE — Progress Notes (Signed)
Subjective:  Patient ID: Dakota Hernandez, male    DOB: 1965-10-23  Age: 55 y.o. MRN: [id number]  CC: Follow-up (4 month check)   HPI Luella Cook Copley presents for HTN, A fib, obesity, Vit D def f/u Joe lost 30 lbs in 6 wks  Outpatient Medications Prior to Visit  Medication Sig Dispense Refill  . acetaminophen (TYLENOL) 500 MG tablet Take 1,000 mg by mouth every 6 (six) hours as needed for mild pain or fever.    [name] apixaban (ELIQUIS) 5 MG TABS tablet Take 1 tablet (5 mg total) by mouth 2 (two) times daily. 60 tablet 6  . diltiazem (CARDIZEM CD) 120 MG 24 hr capsule TAKE 1 CAPSULE BY MOUTH  DAILY 90 capsule 3  . furosemide (LASIX) 20 MG tablet TAKE 1 TABLET BY MOUTH  DAILY 90 tablet 0  . lisinopril (ZESTRIL) 20 MG tablet TAKE 1 TABLET BY MOUTH  DAILY 90 tablet 3  . metoprolol (TOPROL-XL) 200 MG 24 hr tablet TAKE 1 TABLET BY MOUTH  DAILY 90 tablet 3  . omeprazole (PRILOSEC OTC) 20 MG tablet Take 20 mg by mouth daily before breakfast.     . triamcinolone cream (KENALOG) 0.1 % Apply 1 application topically 2 (two) times daily. 160 g 3  . vitamin B-12 (CYANOCOBALAMIN) 1000 MCG tablet Take 1 tablet (1,000 mcg total) by mouth daily. 90 tablet 0  . Vitamin D, Ergocalciferol, (DRISDOL) 1.25 MG (50000 UNIT) CAPS capsule Take 1 capsule (50,000 Units total) by mouth every 7 (seven) days. 8 capsule 0   No facility-administered medications prior to visit.    ROS: Review of Systems  Constitutional: Negative for appetite change, fatigue and unexpected weight change.  HENT: Negative for congestion, nosebleeds, sneezing, sore throat and trouble swallowing.   Eyes: Negative for itching and visual disturbance.  Respiratory: Positive for shortness of breath. Negative for cough.   Cardiovascular: Negative for chest pain, palpitations and leg swelling.  Gastrointestinal: Negative for abdominal distention, blood in stool, diarrhea and nausea.  Genitourinary: Negative for frequency and hematuria.   Musculoskeletal: Negative for back pain, gait problem, joint swelling and neck pain.  Skin: Negative for rash.  Neurological: Negative for dizziness, tremors, speech difficulty and weakness.  Psychiatric/Behavioral: Negative for agitation, dysphoric mood and sleep disturbance. The patient is not nervous/anxious.     Objective:  BP 118/80   Pulse 100   Temp 97.9 F (36.6 C) (Oral)   Ht 5\' 9"  (1.753 m)   Wt (!) 357 lb (161.9 kg)   SpO2 97%   BMI 52.72 kg/m   BP Readings from Last 3 Encounters:  03/07/21 118/80  03/05/21 116/79  02/11/21 113/73    Wt Readings from Last 3 Encounters:  03/07/21 (!) 357 lb (161.9 kg)  03/05/21 (!) 352 lb (159.7 kg)  02/11/21 (!) 364 lb (165.1 kg)    Physical Exam Constitutional:      General: He is not in acute distress.    Appearance: He is well-developed. He is obese.     Comments: NAD  Eyes:     Conjunctiva/sclera: Conjunctivae normal.     Pupils: Pupils are equal, round, and reactive to light.  Neck:     Thyroid: No thyromegaly.     Vascular: No JVD.  Cardiovascular:     Rate and Rhythm: Normal rate. Rhythm irregular.     Heart sounds: Normal heart sounds. No murmur heard. No friction rub. No gallop.   Pulmonary:     Effort: Pulmonary effort is  normal. No respiratory distress.     Breath sounds: Normal breath sounds. No wheezing or rales.  Chest:     Chest wall: No tenderness.  Abdominal:     General: Bowel sounds are normal. There is no distension.     Palpations: Abdomen is soft. There is no mass.     Tenderness: There is no abdominal tenderness. There is no guarding or rebound.  Musculoskeletal:        General: No tenderness. Normal range of motion.     Cervical back: Normal range of motion.  Lymphadenopathy:     Cervical: No cervical adenopathy.  Skin:    General: Skin is warm and dry.     Findings: No rash.  Neurological:     Mental Status: He is alert and oriented to person, place, and time.     Cranial Nerves: No  cranial nerve deficit.     Motor: No abnormal muscle tone.     Coordination: Coordination normal.     Gait: Gait normal.     Deep Tendon Reflexes: Reflexes are normal and symmetric.  Psychiatric:        Behavior: Behavior normal.        Thought Content: Thought content normal.        Judgment: Judgment normal.     Lab Results  Component Value Date   WBC 5.1 01/28/2021   HGB 13.8 01/28/2021   HCT 42.0 01/28/2021   PLT 171.0 10/30/2020   GLUCOSE 91 01/28/2021   CHOL 190 01/28/2021   TRIG 72 01/28/2021   HDL 49 01/28/2021   LDLCALC 128 (H) 01/28/2021   ALT 17 01/28/2021   AST 20 01/28/2021   NA 143 01/28/2021   K 4.2 01/28/2021   CL 105 01/28/2021   CREATININE 1.04 01/28/2021   BUN 15 01/28/2021   CO2 23 01/28/2021   TSH 0.875 01/28/2021   INR 1.2 04/14/2020   HGBA1C 6.2 (H) 01/28/2021    No results found.  Assessment & Plan:      No orders of the defined types were placed in this encounter.    Follow-up: No follow-ups on file.  Sonda Primes, MD

## 2021-03-07 NOTE — Assessment & Plan Note (Signed)
On Vit D 

## 2021-03-07 NOTE — Assessment & Plan Note (Signed)
Cont w/B12 

## 2021-03-07 NOTE — Assessment & Plan Note (Signed)
Cont w/Diltiazem, Toprol, Eliquis

## 2021-03-09 ENCOUNTER — Other Ambulatory Visit (INDEPENDENT_AMBULATORY_CARE_PROVIDER_SITE_OTHER): Payer: Self-pay | Admitting: Family Medicine

## 2021-03-09 DIAGNOSIS — E559 Vitamin D deficiency, unspecified: Secondary | ICD-10-CM

## 2021-03-10 ENCOUNTER — Other Ambulatory Visit (INDEPENDENT_AMBULATORY_CARE_PROVIDER_SITE_OTHER): Payer: Self-pay | Admitting: Family Medicine

## 2021-03-10 DIAGNOSIS — E559 Vitamin D deficiency, unspecified: Secondary | ICD-10-CM

## 2021-03-10 NOTE — Progress Notes (Signed)
Chief Complaint:   OBESITY Dakota Hernandez is here to discuss his progress with his obesity treatment plan along with follow-up of his obesity related diagnoses. Dakota Hernandez is on the Category 4 Plan and states he is following his eating plan approximately 70-75% of the time. Dakota Hernandez states he is not currently exercising.  Today's visit was #: 3 Starting weight: 382 lbs Starting date: 01/28/2021 Today's weight: 352 lbs Today's date: 03/05/2021 Total lbs lost to date: 30 Total lbs lost since last in-office visit: 12  Interim History: Dakota Hernandez voices that is still struggling at night with routine of eating. He does eat before bed and isn't necessarily hungry at that time. He just has work planned for the next few weeks. His biggest obstacle in the next few weeks is nighttime eating. He is starting to have cravings for hamburgers.  Subjective:   1. Vitamin D deficiency disease Dakota Hernandez denies nausea, vomiting, and muscle weakness but notes fatigue. Pt is on prescription Vit D.  2. Vitamin B12 deficiency Dakota Hernandez is on daily OTC B12. His last B12 level was 178.  3. At risk for osteoporosis Dakota Hernandez is at higher risk of osteopenia and osteoporosis due to Vitamin D deficiency.   Assessment/Plan:   1. Vitamin D deficiency disease Low Vitamin D level contributes to fatigue and are associated with obesity, breast, and colon cancer. He agrees to continue to take prescription Vitamin D @50 ,000 IU every week and will follow-up for routine testing of Vitamin D, at least 2-3 times per year to avoid over-replacement. - Vitamin D, Ergocalciferol, (DRISDOL) 1.25 MG (50000 UNIT) CAPS capsule; Take 1 capsule (50,000 Units total) by mouth every 7 (seven) days.  Dispense: 8 capsule; Refill: 0  2. Vitamin B12 deficiency The diagnosis was reviewed with the patient. Counseling provided today, see below. We will continue to monitor. Orders and follow up as documented in patient record. Continue OTC B12 with no change in  tx.  Counseling The body needs vitamin B12: to make red blood cells; to make DNA; and to help the nerves work properly so they can carry messages from the brain to the body.  The main causes of vitamin B12 deficiency include dietary deficiency, digestive diseases, pernicious anemia, and having a surgery in which part of the stomach or small intestine is removed.  Certain medicines can make it harder for the body to absorb vitamin B12. These medicines include: heartburn medications; some antibiotics; some medications used to treat diabetes, gout, and high cholesterol.  In some cases, there are no symptoms of this condition. If the condition leads to anemia or nerve damage, various symptoms can occur, such as weakness or fatigue, shortness of breath, and numbness or tingling in your hands and feet.   Treatment:  May include taking vitamin B12 supplements.  Avoid alcohol.  Eat lots of healthy foods that contain vitamin B12: Beef, pork, chicken, , and organ meats, such as liver.  Seafood: This includes clams, rainbow trout, salmon, tuna, and haddock. Eggs.  Cereal and dairy products that are fortified: This means that vitamin B12 has been added to the food.   3. At risk for osteoporosis Dakota Hernandez was given approximately 15 minutes of osteoporosis prevention counseling today. Dakota Hernandez is at risk for osteopenia and osteoporosis due to his Vitamin D deficiency. He was encouraged to take his Vitamin D and follow his higher calcium diet and increase strengthening exercise to help strengthen his bones and decrease his risk of osteopenia and osteoporosis.  Repetitive spaced learning was  employed today to elicit superior memory formation and behavioral change.  4. Class 3 severe obesity due to excess calories with serious comorbidity and body mass index (BMI) of 50.0 to 59.9 in adult [facility]) Dakota Hernandez is currently in the action stage of change. As such, his goal is to continue with weight loss efforts. He has  agreed to the Category 4 Plan.   Exercise goals: No exercise has been prescribed at this time.  Behavioral modification strategies: increasing lean protein intake, meal planning and cooking strategies, keeping healthy foods in the home and planning for success.  Dakota Hernandez has agreed to follow-up with our clinic in 3 weeks. He was informed of the importance of frequent follow-up visits to maximize his success with intensive lifestyle modifications for his multiple health conditions.   Objective:   Blood pressure 116/79, pulse 73, temperature 97.6 F (36.4 C), height 5\' 9"  (1.753 m), weight (!) 352 lb (159.7 kg), SpO2 98 %. Body mass index is 51.98 kg/m.  General: Cooperative, alert, well developed, in no acute distress. HEENT: Conjunctivae and lids unremarkable. Cardiovascular: Regular rhythm.  Lungs: Normal work of breathing. Neurologic: No focal deficits.   Lab Results  Component Value Date   CREATININE 1.04 01/28/2021   BUN 15 01/28/2021   NA 143 01/28/2021   K 4.2 01/28/2021   CL 105 01/28/2021   CO2 23 01/28/2021   Lab Results  Component Value Date   ALT 17 01/28/2021   AST 20 01/28/2021   ALKPHOS 84 01/28/2021   BILITOT 1.0 01/28/2021   Lab Results  Component Value Date   HGBA1C 6.2 (H) 01/28/2021   HGBA1C 6.0 10/30/2020   HGBA1C 5.7 (H) 04/15/2020   Lab Results  Component Value Date   INSULIN 17.7 01/28/2021   Lab Results  Component Value Date   TSH 0.875 01/28/2021   Lab Results  Component Value Date   CHOL 190 01/28/2021   HDL 49 01/28/2021   LDLCALC 128 (H) 01/28/2021   TRIG 72 01/28/2021   CHOLHDL 4.0 CALC 06/14/2008   Lab Results  Component Value Date   WBC 5.1 01/28/2021   HGB 13.8 01/28/2021   HCT 42.0 01/28/2021   MCV 88 01/28/2021   PLT 171.0 10/30/2020   No results found for: IRON, TIBC, FERRITIN   Attestation Statements:   Reviewed by clinician on day of visit: allergies, medications, problem list, medical history, surgical  history, family history, social history, and previous encounter notes.  11/01/2020, CMA, am acting as transcriptionist for Edmund Hilda, MD.  I have reviewed the above documentation for accuracy and completeness, and I agree with the above. - Reuben Likes, MD

## 2021-03-10 NOTE — Telephone Encounter (Signed)
DR Ukleja 

## 2021-03-12 DIAGNOSIS — G4733 Obstructive sleep apnea (adult) (pediatric): Secondary | ICD-10-CM | POA: Diagnosis not present

## 2021-03-13 DIAGNOSIS — G4733 Obstructive sleep apnea (adult) (pediatric): Secondary | ICD-10-CM | POA: Diagnosis not present

## 2021-03-27 ENCOUNTER — Encounter (INDEPENDENT_AMBULATORY_CARE_PROVIDER_SITE_OTHER): Payer: Self-pay | Admitting: Family Medicine

## 2021-03-27 ENCOUNTER — Other Ambulatory Visit: Payer: Self-pay

## 2021-03-27 ENCOUNTER — Ambulatory Visit (INDEPENDENT_AMBULATORY_CARE_PROVIDER_SITE_OTHER): Payer: BC Managed Care – PPO | Admitting: Family Medicine

## 2021-03-27 VITALS — BP 131/84 | HR 66 | Temp 97.8°F | Ht 69.0 in | Wt 343.0 lb

## 2021-03-27 DIAGNOSIS — E538 Deficiency of other specified B group vitamins: Secondary | ICD-10-CM

## 2021-03-27 DIAGNOSIS — Z9189 Other specified personal risk factors, not elsewhere classified: Secondary | ICD-10-CM | POA: Diagnosis not present

## 2021-03-27 DIAGNOSIS — E559 Vitamin D deficiency, unspecified: Secondary | ICD-10-CM

## 2021-03-27 DIAGNOSIS — Z6841 Body Mass Index (BMI) 40.0 and over, adult: Secondary | ICD-10-CM | POA: Diagnosis not present

## 2021-03-27 MED ORDER — VITAMIN D (ERGOCALCIFEROL) 1.25 MG (50000 UNIT) PO CAPS: 50000.0000 [IU] | ORAL_CAPSULE | ORAL | 0 refills | Status: DC

## 2021-03-27 MED ORDER — VITAMIN B-12 1000 MCG PO TABS: 1000.0000 ug | ORAL_TABLET | Freq: Every day | ORAL | 0 refills | Status: AC

## 2021-04-01 NOTE — Progress Notes (Signed)
Chief Complaint:   OBESITY Dakota Hernandez is here to discuss his progress with his obesity treatment plan along with follow-up of his obesity related diagnoses. Dakota Hernandez is on the Category 4 Plan and states he is following his eating plan approximately 75-80% of the time. [name] states he is not currently exercising.  Today's visit was #: 4 Starting weight: 382 lbs Starting date: 01/28/2021 Today's weight: 343 lbs Today's date: 03/27/2021 Total lbs lost to date: 39 lbs Total lbs lost since last in-office visit: 9  Interim History: Dakota Hernandez has been mostly working- air conditioning is not working currently. He has been eating on plan most of the time. He cut out snacks at work. She is still grazing around 9:30-10 PM at night. Doing crackers for snack and eating fruit in evening. For the next few weeks, pt doesn't have plans outside of norm. No obstacles.  Subjective:   1. Vitamin D deficiency disease Dakota Hernandez denies nausea, vomiting, and muscle weakness but notes fatigue. Pt is on prescription Vit D. His last Vit D level is 11.0.  2. Vitamin B12 deficiency Dakota Hernandez is on prescription Vit B12. He reports fatigue. His last Vit B12 was 178.  3. At increased risk of exposure to COVID-19 virus Dakota Hernandez is at increased risk for exposure to COVID-19 virus due to increased COVID exposure at work due to increase in cases.  Assessment/Plan:   1. Vitamin D deficiency disease Low Vitamin D level contributes to fatigue and are associated with obesity, breast, and colon cancer. He agrees to continue to take prescription Vitamin D @50 ,000 IU every week and will follow-up for routine testing of Vitamin D, at least 2-3 times per year to avoid over-replacement.  Refill- Vitamin D, Ergocalciferol, (DRISDOL) 1.25 MG (50000 UNIT) CAPS capsule; Take 1 capsule (50,000 Units total) by mouth every 7 (seven) days.  Dispense: 8 capsule; Refill: 0  2. Vitamin B12 deficiency The diagnosis was reviewed with the patient.  Counseling provided today, see below. We will continue to monitor. Orders and follow up as documented in patient record.  Counseling The body needs vitamin B12: to make red blood cells; to make DNA; and to help the nerves work properly so they can carry messages from the brain to the body.  The main causes of vitamin B12 deficiency include dietary deficiency, digestive diseases, pernicious anemia, and having a surgery in which part of the stomach or small intestine is removed.  Certain medicines can make it harder for the body to absorb vitamin B12. These medicines include: heartburn medications; some antibiotics; some medications used to treat diabetes, gout, and high cholesterol.  In some cases, there are no symptoms of this condition. If the condition leads to anemia or nerve damage, various symptoms can occur, such as weakness or fatigue, shortness of breath, and numbness or tingling in your hands and feet.   Treatment:  May include taking vitamin B12 supplements.  Avoid alcohol.  Eat lots of healthy foods that contain vitamin B12: Beef, pork, chicken, , and organ meats, such as liver.  Seafood: This includes clams, rainbow trout, salmon, tuna, and haddock. Eggs.  Cereal and dairy products that are fortified: This means that vitamin B12 has been added to the food.   Refill- vitamin B-12 (CYANOCOBALAMIN) 1000 MCG tablet; Take 1 tablet (1,000 mcg total) by mouth daily.  Dispense: 90 tablet; Refill: 0  3. At increased risk of exposure to COVID-19 virus Dakota Hernandez was given approximately 15 minutes of COVID prevention counseling today Counseling COVID-19  is a respiratory infection that is caused by a virus. It can cause serious infections, such as pneumonia, acute respiratory distress syndrome, acute respiratory failure, or sepsis. You are more likely to develop a serious illness if you are 54 years of age or older, have a weak immune system, live in a nursing home, have chronic disease, or  have obesity. Get vaccinated as soon as they are available to you.  For our most current information, please visit [email or web address] Wash your hands often with soap and water for 20 seconds. If soap and water are not available, use alcohol-based hand sanitizer. Wear a face mask. Make sure your mask covers your nose and mouth. Maintain at least 6 feet distance from others when in public.  Get help right away if You have trouble breathing, chest pain, confusion, or other concerning symptoms.  Repetitive spaced learning was employed today to elicit superior memory formation and behavioral change.  4. Class 3 severe obesity due to excess calories with serious comorbidity and body mass index (BMI) of 50.0 to 59.9 in adult [facility])  Raji is currently in the action stage of change. As such, his goal is to continue with weight loss efforts. He has agreed to the Category 4 Plan.   Exercise goals: All adults should avoid inactivity. Some physical activity is better than none, and adults who participate in any amount of physical activity gain some health benefits.  Behavioral modification strategies: increasing lean protein intake, meal planning and cooking strategies, keeping healthy foods in the home, and planning for success.  Dakota Hernandez has agreed to follow-up with our clinic in 3 weeks. He was informed of the importance of frequent follow-up visits to maximize his success with intensive lifestyle modifications for his multiple health conditions.   Objective:   Blood pressure 131/84, pulse 66, temperature 97.8 F (36.6 C), height 5\' 9"  (1.753 m), weight (!) 343 lb (155.6 kg), SpO2 96 %. Body mass index is 50.65 kg/m.  General: Cooperative, alert, well developed, in no acute distress. HEENT: Conjunctivae and lids unremarkable. Cardiovascular: Regular rhythm.  Lungs: Normal work of breathing. Neurologic: No focal deficits.   Lab Results  Component Value Date   CREATININE 1.04  01/28/2021   BUN 15 01/28/2021   NA 143 01/28/2021   K 4.2 01/28/2021   CL 105 01/28/2021   CO2 23 01/28/2021   Lab Results  Component Value Date   ALT 17 01/28/2021   AST 20 01/28/2021   ALKPHOS 84 01/28/2021   BILITOT 1.0 01/28/2021   Lab Results  Component Value Date   HGBA1C 6.2 (H) 01/28/2021   HGBA1C 6.0 10/30/2020   HGBA1C 5.7 (H) 04/15/2020   Lab Results  Component Value Date   INSULIN 17.7 01/28/2021   Lab Results  Component Value Date   TSH 0.875 01/28/2021   Lab Results  Component Value Date   CHOL 190 01/28/2021   HDL 49 01/28/2021   LDLCALC 128 (H) 01/28/2021   TRIG 72 01/28/2021   CHOLHDL 4.0 CALC 06/14/2008   Lab Results  Component Value Date   WBC 5.1 01/28/2021   HGB 13.8 01/28/2021   HCT 42.0 01/28/2021   MCV 88 01/28/2021   PLT 171.0 10/30/2020    Attestation Statements:   Reviewed by clinician on day of visit: allergies, medications, problem list, medical history, surgical history, family history, social history, and previous encounter notes.  11/01/2020, CMA, am acting as transcriptionist for Edmund Hilda, MD.  I have reviewed the above documentation  for accuracy and completeness, and I agree with the above. - Katherina Mires, MD

## 2021-04-11 DIAGNOSIS — G4733 Obstructive sleep apnea (adult) (pediatric): Secondary | ICD-10-CM | POA: Diagnosis not present

## 2021-04-16 ENCOUNTER — Other Ambulatory Visit: Payer: Self-pay | Admitting: Cardiology

## 2021-04-16 DIAGNOSIS — R601 Generalized edema: Secondary | ICD-10-CM

## 2021-04-17 ENCOUNTER — Encounter (INDEPENDENT_AMBULATORY_CARE_PROVIDER_SITE_OTHER): Payer: Self-pay | Admitting: Family Medicine

## 2021-04-17 ENCOUNTER — Ambulatory Visit (INDEPENDENT_AMBULATORY_CARE_PROVIDER_SITE_OTHER): Payer: BC Managed Care – PPO | Admitting: Family Medicine

## 2021-04-17 ENCOUNTER — Other Ambulatory Visit: Payer: Self-pay

## 2021-04-17 VITALS — BP 120/78 | HR 86 | Temp 97.8°F | Ht 69.0 in | Wt 336.0 lb

## 2021-04-17 DIAGNOSIS — Z6841 Body Mass Index (BMI) 40.0 and over, adult: Secondary | ICD-10-CM | POA: Diagnosis not present

## 2021-04-17 DIAGNOSIS — E559 Vitamin D deficiency, unspecified: Secondary | ICD-10-CM | POA: Diagnosis not present

## 2021-04-17 DIAGNOSIS — E538 Deficiency of other specified B group vitamins: Secondary | ICD-10-CM | POA: Diagnosis not present

## 2021-04-23 NOTE — Progress Notes (Signed)
HPI Never smoker followed for OSA, complicated by Chronic Venous Insufficiency, Permanent AFib/ Eliquis, HTN, CM/CHF, DM, Morbid Obesity NPSG 05/06/20- AHI 22.4/ hr, desaturation to 67%,  CPAP to 12, body weight 366 lbs  ======================================================   07/23/20-       Chronic Venous Insufficiency, Permanent AFib/ Eliquis, HTN, CM/CHF, DM, Morbid Obesity NPSG 05/06/20- AHI 22.4/ hr, desaturation to 67%,  CPAP to 12, body weight 366 lbs Hosp in September for cellulitis of R leg Body weight today- 373 lbs Covid  vax -2 Phizer Flu vax- declines We reviewed sleep study results and discussed CPAP as best initial option for him. He remains in permanent AFib. Says leg has healed. CTa chest 04/14/20- IMPRESSION: 1. No acute abnormality. 2. Technically borderline study without definite pulmonary emboli. 3. Cardiomegaly.  10/23/20-  Patient arrived but still doesn't have machine and denies other issues. We will contact [facility] and reschedule this visit.   04/24/21- Dakota Hernandez never smoker followed for OSA, complicated by Chronic Venous Insufficiency, Permanent AFib/ Eliquis, HTN, CM/CHF, DM, Morbid Obesity, Cellulitis R leg,  CPAP 5-15/ [facility]           -new order 07/23/20    AirSense 11 AutoSet Download- compliance 100%, AHI 4.3/ hr Body weight today- 338 lbs Covid vax- -----Reports sleeping better with CPAP use Sleeping better, better rested with CPAP.Occasional restless night. We discussed trial of otc sleep aid initially if needed.  ROS-see HPI   + = positive Constitutional:    weight loss, night sweats, fevers, chills, fatigue, lassitude. HEENT:    headaches, difficulty swallowing, tooth/dental problems, sore throat,       sneezing, itching, ear ache, nasal congestion, post nasal drip, snoring CV:    chest pain, orthopnea, PND, +swelling in lower extremities, anasarca,                                  dizziness, +palpitations Resp:   +shortness of breath with exertion or at  rest.                productive cough,   non-productive cough, coughing up of blood.              change in color of mucus.  wheezing.   Skin:    rash or lesions. GI:  No-   heartburn, indigestion, abdominal pain, nausea, vomiting, diarrhea,                 change in bowel habits, loss of appetite GU: dysuria, change in color of urine, no urgency or frequency.   flank pain. MS:   joint pain, stiffness, decreased range of motion, back pain. Neuro-     nothing unusual Psych:  change in mood or affect.  depression or anxiety.   memory loss.  OBJ- Physical Exam General- Alert, Oriented, Affect-appropriate, Distress- none acute,  + morbidly obese Skin- rash-none, lesions- none, excoriation- none Lymphadenopathy- none Head- atraumatic            Eyes- Gross vision intact, PERRLA, conjunctivae and secretions clear            Ears- Hearing, canals-normal            Nose- Clear, no-Septal dev, mucus, polyps, erosion, perforation             Throat- Mallampati III_IV , mucosa clear , drainage- none, tonsils- atrophic,  + teeth Neck- flexible , trachea midline, no stridor ,  thyroid nl, carotid no bruit Chest - symmetrical excursion , unlabored           Heart/CV- +IRR/ AFib , no murmur , no gallop  , no rub, nl s1 s2                           - JVD+1 , edema+tight 4+, stasis changes- none, varices- none           Lung- clear to P&A, wheeze- none, cough- none , dullness-none, rub- none           Chest wall-  Abd-  Br/ Gen/ Rectal- Not done, not indicated Extrem- cyanosis- none, clubbing, none, atrophy- none, strength- nl Neuro- grossly intact to observation

## 2021-04-24 ENCOUNTER — Other Ambulatory Visit: Payer: Self-pay

## 2021-04-24 ENCOUNTER — Encounter: Payer: Self-pay | Admitting: Internal Medicine

## 2021-04-24 ENCOUNTER — Ambulatory Visit (INDEPENDENT_AMBULATORY_CARE_PROVIDER_SITE_OTHER): Payer: BC Managed Care – PPO | Admitting: Internal Medicine

## 2021-04-24 DIAGNOSIS — G4733 Obstructive sleep apnea (adult) (pediatric): Secondary | ICD-10-CM | POA: Diagnosis not present

## 2021-04-24 DIAGNOSIS — I4821 Permanent atrial fibrillation: Secondary | ICD-10-CM

## 2021-04-24 NOTE — Progress Notes (Signed)
Chief Complaint:   OBESITY Dakota Hernandez is here to discuss his progress with his obesity treatment plan along with follow-up of his obesity related diagnoses. Dakota Hernandez is on the Category 4 Plan and states he is following his eating plan approximately 70-75% of the time. Dakota Hernandez states he is not currently exercising.  Today's visit was #: 5 Starting weight: 382 lbs Starting date: 01/28/2021 Today's weight: 336 lbs Today's date: 04/17/2021 Total lbs lost to date: 46 Total lbs lost since last in-office visit: 7  Interim History: Dakota Hernandez has changed some vegetables up over the last few weeks. He has cut down on snacking and is now staying within 400 snack calories. He denies hunger. He is getting in 8 oz or more of protein at dinner. For lunch, he is doing chicken sandwich. He's doing eggs, ham, and cheese.  Subjective:   1. Vitamin D deficiency disease Pt denies nausea, vomiting, and muscle weakness but notes fatigue. Pt is on prescription Vit D.  2. Vitamin B12 deficiency Dakota Hernandez is on OTC Vit B12. He reports fatigue.  Assessment/Plan:   1. Vitamin D deficiency disease Low Vitamin D level contributes to fatigue and are associated with obesity, breast, and colon cancer. He agrees to continue to take prescription Vitamin D @50 ,000 IU every week and will follow-up for routine testing of Vitamin D, at least 2-3 times per year to avoid over-replacement.  2. Vitamin B12 deficiency Continue OTC B12 supplement. The diagnosis was reviewed with the patient. Counseling provided today, see below. We will continue to monitor. Orders and follow up as documented in patient record.  Counseling The body needs vitamin B12: to make red blood cells; to make DNA; and to help the nerves work properly so they can carry messages from the brain to the body.  The main causes of vitamin B12 deficiency include dietary deficiency, digestive diseases, pernicious anemia, and having a surgery in which part of the stomach  or small intestine is removed.  Certain medicines can make it harder for the body to absorb vitamin B12. These medicines include: heartburn medications; some antibiotics; some medications used to treat diabetes, gout, and high cholesterol.  In some cases, there are no symptoms of this condition. If the condition leads to anemia or nerve damage, various symptoms can occur, such as weakness or fatigue, shortness of breath, and numbness or tingling in your hands and feet.   Treatment:  May include taking vitamin B12 supplements.  Avoid alcohol.  Eat lots of healthy foods that contain vitamin B12: Beef, pork, chicken, , and organ meats, such as liver.  Seafood: This includes clams, rainbow trout, salmon, tuna, and haddock. Eggs.  Cereal and dairy products that are fortified: This means that vitamin B12 has been added to the food.   3. Class 3 severe obesity due to excess calories with serious comorbidity and body mass index (BMI) of 50.0 to 59.9 in adult Dakota Hernandez)  Dakota Hernandez is currently in the action stage of change. As such, his goal is to continue with weight loss efforts. He has agreed to the Category 4 Plan.   Exercise goals:  exercise 10-15 minutes 2-3 times a week.  Behavioral modification strategies: increasing lean protein intake, meal planning and cooking strategies, keeping healthy foods in the home, and planning for success.  Dakota Hernandez has agreed to follow-up with our clinic in 2-3 weeks. He was informed of the importance of frequent follow-up visits to maximize his success with intensive lifestyle modifications for his multiple health conditions.  Objective:   Blood pressure 120/78, pulse 86, temperature 97.8 F (36.6 C), height 5\' 9"  (1.753 m), weight (!) 336 lb (152.4 kg), SpO2 98 %. Body mass index is 49.62 kg/m.  General: Cooperative, alert, well developed, in no acute distress. HEENT: Conjunctivae and lids unremarkable. Cardiovascular: Regular rhythm.  Lungs: Normal work of  breathing. Neurologic: No focal deficits.   Lab Results  Component Value Date   CREATININE 1.04 01/28/2021   BUN 15 01/28/2021   NA 143 01/28/2021   K 4.2 01/28/2021   CL 105 01/28/2021   CO2 23 01/28/2021   Lab Results  Component Value Date   ALT 17 01/28/2021   AST 20 01/28/2021   ALKPHOS 84 01/28/2021   BILITOT 1.0 01/28/2021   Lab Results  Component Value Date   HGBA1C 6.2 (H) 01/28/2021   HGBA1C 6.0 10/30/2020   HGBA1C 5.7 (H) 04/15/2020   Lab Results  Component Value Date   INSULIN 17.7 01/28/2021   Lab Results  Component Value Date   TSH 0.875 01/28/2021   Lab Results  Component Value Date   CHOL 190 01/28/2021   HDL 49 01/28/2021   LDLCALC 128 (H) 01/28/2021   TRIG 72 01/28/2021   CHOLHDL 4.0 CALC 06/14/2008   Lab Results  Component Value Date   VD25OH 11.0 (L) 01/28/2021   VD25OH 14 (L) 04/29/2020   Lab Results  Component Value Date   WBC 5.1 01/28/2021   HGB 13.8 01/28/2021   HCT 42.0 01/28/2021   MCV 88 01/28/2021   PLT 171.0 10/30/2020   No results found for: IRON, TIBC, FERRITIN  Attestation Statements:   Reviewed by clinician on day of visit: allergies, medications, problem list, medical history, surgical history, family history, social history, and previous encounter notes.  Time spent on visit including pre-visit chart review and post-visit care and charting was 12 minutes.   11/01/2020, CMA, am acting as transcriptionist for Edmund Hilda, MD.  I have reviewed the above documentation for accuracy and completeness, and I agree with the above. - Reuben Likes, MD

## 2021-04-24 NOTE — Assessment & Plan Note (Signed)
Continue efforts at diet and exercise. Failed Bariatric surgery.

## 2021-04-24 NOTE — Patient Instructions (Addendum)
We can continue CPAP auto 5-15  Please let us know if you have problems so we can help. Call [facility] for any help you need from them.

## 2021-04-24 NOTE — Assessment & Plan Note (Signed)
Pulse almost regular at this exam. RSR w PACs vs very good rate control.

## 2021-04-24 NOTE — Assessment & Plan Note (Signed)
Benefits from CPAP. Discussed  Comfort expectations. Plan- continue auto 5-15

## 2021-04-29 ENCOUNTER — Other Ambulatory Visit (INDEPENDENT_AMBULATORY_CARE_PROVIDER_SITE_OTHER): Payer: Self-pay | Admitting: Family Medicine

## 2021-04-29 DIAGNOSIS — E559 Vitamin D deficiency, unspecified: Secondary | ICD-10-CM

## 2021-05-06 ENCOUNTER — Encounter (INDEPENDENT_AMBULATORY_CARE_PROVIDER_SITE_OTHER): Payer: Self-pay | Admitting: Family Medicine

## 2021-05-06 ENCOUNTER — Ambulatory Visit (INDEPENDENT_AMBULATORY_CARE_PROVIDER_SITE_OTHER): Payer: BC Managed Care – PPO | Admitting: Family Medicine

## 2021-05-06 ENCOUNTER — Other Ambulatory Visit: Payer: Self-pay

## 2021-05-06 VITALS — BP 117/76 | HR 91 | Temp 97.8°F | Ht 69.0 in | Wt 328.0 lb

## 2021-05-06 DIAGNOSIS — G4733 Obstructive sleep apnea (adult) (pediatric): Secondary | ICD-10-CM | POA: Diagnosis not present

## 2021-05-06 DIAGNOSIS — I4821 Permanent atrial fibrillation: Secondary | ICD-10-CM

## 2021-05-06 DIAGNOSIS — Z6841 Body Mass Index (BMI) 40.0 and over, adult: Secondary | ICD-10-CM

## 2021-05-07 NOTE — Progress Notes (Signed)
Chief Complaint:   OBESITY Dakota Hernandez is here to discuss his progress with his obesity treatment plan along with follow-up of his obesity related diagnoses. Dakota Hernandez is on the Category 4 Plan and states he is following his eating plan approximately 75-80% of the time. Herrick states he is walking 10 minutes 2-3 times per week.  Today's visit was #: 6 Starting weight: 382 lbs Starting date: 01/28/2021 Today's weight: 328 lbs Today's date: 05/06/2021 Total lbs lost to date: 54 Total lbs lost since last in-office visit: 8  Interim History: Dakota Hernandez is doing well on meal plan. He has had 1-2 times of indulgent eating and occasionally not getting all food in. He is doing eggs with ham at breakfast. He is doing sandwich with fruit cup and water . Dinner is 8 oz meat and vegetables. Snacks are at night. He is doing bread at lunch but not 45 calorie bread. Snacks are fruit. He will be on vacation the week of the 14th. He wants to be mindful of snack quantity at night.  Subjective:   1. OSA (obstructive sleep apnea) Dewaun finally got CPAP about 3 months ago. His sleep amount has increased and incident # ~3 times per hour.  2. Permanent atrial fibrillation with RVR (HCC) Yancy denies SOB. He denies palpitations or feelings of fluttering. Compliance with medications is superb.  Assessment/Plan:   1. OSA (obstructive sleep apnea) Intensive lifestyle modifications are the first line treatment for this issue. We discussed several lifestyle modifications today and he will continue to work on diet, exercise and weight loss efforts. We will continue to monitor. Orders and follow up as documented in patient record. Follow up with Dr. Maple Hudson.  2. Permanent atrial fibrillation with RVR (HCC) Follow up with Dr. Jens Som.  3. Obesity with current BMI of 48.6  Dakota Hernandez is currently in the action stage of change. As such, his goal is to continue with weight loss efforts. He has agreed to the Category 4 Plan.    Exercise goals: All adults should avoid inactivity. Some physical activity is better than none, and adults who participate in any amount of physical activity gain some health benefits.  Behavioral modification strategies: increasing lean protein intake, meal planning and cooking strategies, keeping healthy foods in the home, and planning for success.  Dakota Hernandez has agreed to follow-up with our clinic in 3 weeks. He was informed of the importance of frequent follow-up visits to maximize his success with intensive lifestyle modifications for his multiple health conditions.   Objective:   Blood pressure 117/76, pulse 91, temperature 97.8 F (36.6 C), height 5\' 9"  (1.753 m), weight (!) 328 lb (148.8 kg), SpO2 97 %. Body mass index is 48.44 kg/m.  General: Cooperative, alert, well developed, in no acute distress. HEENT: Conjunctivae and lids unremarkable. Cardiovascular: Regular rhythm.  Lungs: Normal work of breathing. Neurologic: No focal deficits.   Lab Results  Component Value Date   CREATININE 1.04 01/28/2021   BUN 15 01/28/2021   NA 143 01/28/2021   K 4.2 01/28/2021   CL 105 01/28/2021   CO2 23 01/28/2021   Lab Results  Component Value Date   ALT 17 01/28/2021   AST 20 01/28/2021   ALKPHOS 84 01/28/2021   BILITOT 1.0 01/28/2021   Lab Results  Component Value Date   HGBA1C 6.2 (H) 01/28/2021   HGBA1C 6.0 10/30/2020   HGBA1C 5.7 (H) 04/15/2020   Lab Results  Component Value Date   INSULIN 17.7 01/28/2021   Lab  Results  Component Value Date   TSH 0.875 01/28/2021   Lab Results  Component Value Date   CHOL 190 01/28/2021   HDL 49 01/28/2021   LDLCALC 128 (H) 01/28/2021   TRIG 72 01/28/2021   CHOLHDL 4.0 CALC 06/14/2008   Lab Results  Component Value Date   VD25OH 11.0 (L) 01/28/2021   VD25OH 14 (L) 04/29/2020   Lab Results  Component Value Date   WBC 5.1 01/28/2021   HGB 13.8 01/28/2021   HCT 42.0 01/28/2021   MCV 88 01/28/2021   PLT 171.0 10/30/2020     Attestation Statements:   Reviewed by clinician on day of visit: allergies, medications, problem list, medical history, surgical history, family history, social history, and previous encounter notes.  Time spent on visit including pre-visit chart review and post-visit care and charting was 13 minutes.   Edmund Hilda, CMA, am acting as transcriptionist for Reuben Likes, MD.   I have reviewed the above documentation for accuracy and completeness, and I agree with the above. - Reuben Likes, MD

## 2021-05-12 DIAGNOSIS — G4733 Obstructive sleep apnea (adult) (pediatric): Secondary | ICD-10-CM | POA: Diagnosis not present

## 2021-05-20 ENCOUNTER — Other Ambulatory Visit: Payer: Self-pay | Admitting: Internal Medicine

## 2021-05-20 ENCOUNTER — Telehealth: Payer: Self-pay

## 2021-05-20 MED ORDER — DOXYCYCLINE HYCLATE 100 MG PO TABS: 100.0000 mg | ORAL_TABLET | Freq: Two times a day (BID) | ORAL | 0 refills | Status: DC

## 2021-05-20 NOTE — Telephone Encounter (Signed)
Patient requesting an antibiotic for cellulitis. No available appointments this week. Patient says the cellulitis is an ongoing issue.

## 2021-05-20 NOTE — Telephone Encounter (Signed)
I emailed a prescription for doxycycline.  Office visit if not better.  Please keep your return office visit.  Thanks

## 2021-05-20 NOTE — Addendum Note (Signed)
Addended by: Tresa Garter on: 05/20/2021 11:00 PM   Modules accepted: Orders

## 2021-05-21 NOTE — Telephone Encounter (Signed)
Notified pt w/MD response.../lmb 

## 2021-06-04 ENCOUNTER — Ambulatory Visit (INDEPENDENT_AMBULATORY_CARE_PROVIDER_SITE_OTHER): Payer: BC Managed Care – PPO | Admitting: Family Medicine

## 2021-06-04 ENCOUNTER — Other Ambulatory Visit: Payer: Self-pay

## 2021-06-04 ENCOUNTER — Encounter (INDEPENDENT_AMBULATORY_CARE_PROVIDER_SITE_OTHER): Payer: Self-pay | Admitting: Family Medicine

## 2021-06-04 VITALS — BP 114/70 | HR 92 | Temp 97.5°F | Ht 69.0 in | Wt 322.0 lb

## 2021-06-04 DIAGNOSIS — Z6841 Body Mass Index (BMI) 40.0 and over, adult: Secondary | ICD-10-CM

## 2021-06-04 DIAGNOSIS — E538 Deficiency of other specified B group vitamins: Secondary | ICD-10-CM

## 2021-06-04 DIAGNOSIS — Z9189 Other specified personal risk factors, not elsewhere classified: Secondary | ICD-10-CM

## 2021-06-04 DIAGNOSIS — E559 Vitamin D deficiency, unspecified: Secondary | ICD-10-CM | POA: Diagnosis not present

## 2021-06-04 MED ORDER — VITAMIN D (ERGOCALCIFEROL) 1.25 MG (50000 UNIT) PO CAPS: 50000.0000 [IU] | ORAL_CAPSULE | ORAL | 0 refills | Status: DC

## 2021-06-04 NOTE — Progress Notes (Signed)
Chief Complaint:   OBESITY Dakota Hernandez is here to discuss his progress with his obesity treatment plan along with follow-up of his obesity related diagnoses. Dakota Hernandez is on the Category 4 Plan and states he is following his eating plan approximately 75% of the time. Dakota Hernandez states he is walking 10-15 minutes 2-3 times per week.  Today's visit was #: 7 Starting weight: 382 lbs Starting date: 01/28/2021 Today's weight: 322 lbs Today's date: 06/04/2021 Total lbs lost to date: 60 Total lbs lost since last in-office visit: 6  Interim History: Dakota Hernandez has been working and had a week vacation, which he hung out at home. Working Saturday and Monday of Labor Day weekend. Pt wants to continue meal plan and feels he occasionally grazes after dinner and is not always in calorie snack allotment.  Subjective:   1. Vitamin D deficiency disease Dakota Hernandez denies nausea, vomiting, and muscle weakness but notes fatigue. Pt is on prescription Vit D.  2. Vitamin B12 deficiency Change in diet after lab draw.  3. At risk for osteoporosis Dakota Hernandez is at higher risk of osteopenia and osteoporosis due to Vitamin D deficiency.   Assessment/Plan:   1. Vitamin D deficiency disease Low Vitamin D level contributes to fatigue and are associated with obesity, breast, and colon cancer. He agrees to continue to take prescription Vitamin D 50,000 IU every week and will follow-up for routine testing of Vitamin D, at least 2-3 times per year to avoid over-replacement.  Refill- Vitamin D, Ergocalciferol, (DRISDOL) 1.25 MG (50000 UNIT) CAPS capsule; Take 1 capsule (50,000 Units total) by mouth every 7 (seven) days.  Dispense: 8 capsule; Refill: 0  2. Vitamin B12 deficiency The diagnosis was reviewed with the patient. Counseling provided today, see below. We will continue to monitor. Orders and follow up as documented in patient record. Check B12 level at next lab draw.  Counseling The body needs vitamin B12: to make red blood  cells; to make DNA; and to help the nerves work properly so they can carry messages from the brain to the body.  The main causes of vitamin B12 deficiency include dietary deficiency, digestive diseases, pernicious anemia, and having a surgery in which part of the stomach or small intestine is removed.  Certain medicines can make it harder for the body to absorb vitamin B12. These medicines include: heartburn medications; some antibiotics; some medications used to treat diabetes, gout, and high cholesterol.  In some cases, there are no symptoms of this condition. If the condition leads to anemia or nerve damage, various symptoms can occur, such as weakness or fatigue, shortness of breath, and numbness or tingling in your hands and feet.   Treatment:  May include taking vitamin B12 supplements.  Avoid alcohol.  Eat lots of healthy foods that contain vitamin B12: Beef, pork, chicken, [place], and organ meats, such as liver.  Seafood: This includes clams, rainbow trout, salmon, tuna, and haddock. Eggs.  Cereal and dairy products that are fortified: This means that vitamin B12 has been added to the food.   3. At risk for osteoporosis Dakota Hernandez was given approximately 15 minutes of osteoporosis prevention counseling today. Dakota Hernandez is at risk for osteopenia and osteoporosis due to his Vitamin D deficiency. He was encouraged to take his Vitamin D and follow his higher calcium diet and increase strengthening exercise to help strengthen his bones and decrease his risk of osteopenia and osteoporosis.  Repetitive spaced learning was employed today to elicit superior memory formation and behavioral change.  4. Obesity with current BMI of 47.6  Dakota Hernandez is currently in the action stage of change. As such, his goal is to continue with weight loss efforts. He has agreed to the Category 4 Plan.   Pt is to be more mindful and in control of snacking.  Exercise goals: All adults should avoid inactivity. Some physical  activity is better than none, and adults who participate in any amount of physical activity gain some health benefits. Pt is to add in resistance training 10-15 minutes 2-3 times a week.  Behavioral modification strategies: increasing lean protein intake, meal planning and cooking strategies, keeping healthy foods in the home, and planning for success.  Dakota Hernandez has agreed to follow-up with our clinic in 3-4 weeks. He was informed of the importance of frequent follow-up visits to maximize his success with intensive lifestyle modifications for his multiple health conditions.   Objective:   Blood pressure 114/70, pulse 92, temperature (!) 97.5 F (36.4 C), height 5\' 9"  (1.753 m), weight (!) 322 lb (146.1 kg), SpO2 97 %. Body mass index is 47.55 kg/m.  General: Cooperative, alert, well developed, in no acute distress. HEENT: Conjunctivae and lids unremarkable. Cardiovascular: Regular rhythm.  Lungs: Normal work of breathing. Neurologic: No focal deficits.   Lab Results  Component Value Date   CREATININE 1.04 01/28/2021   BUN 15 01/28/2021   NA 143 01/28/2021   K 4.2 01/28/2021   CL 105 01/28/2021   CO2 23 01/28/2021   Lab Results  Component Value Date   ALT 17 01/28/2021   AST 20 01/28/2021   ALKPHOS 84 01/28/2021   BILITOT 1.0 01/28/2021   Lab Results  Component Value Date   HGBA1C 6.2 (H) 01/28/2021   HGBA1C 6.0 10/30/2020   HGBA1C 5.7 (H) 04/15/2020   Lab Results  Component Value Date   INSULIN 17.7 01/28/2021   Lab Results  Component Value Date   TSH 0.875 01/28/2021   Lab Results  Component Value Date   CHOL 190 01/28/2021   HDL 49 01/28/2021   LDLCALC 128 (H) 01/28/2021   TRIG 72 01/28/2021   CHOLHDL 4.0 CALC 06/14/2008   Lab Results  Component Value Date   VD25OH 11.0 (L) 01/28/2021   VD25OH 14 (L) 04/29/2020   Lab Results  Component Value Date   WBC 5.1 01/28/2021   HGB 13.8 01/28/2021   HCT 42.0 01/28/2021   MCV 88 01/28/2021   PLT 171.0  10/30/2020    Attestation Statements:   Reviewed by clinician on day of visit: allergies, medications, problem list, medical history, surgical history, family history, social history, and previous encounter notes.  11/01/2020, CMA, am acting as transcriptionist for Edmund Hilda, MD.   I have reviewed the above documentation for accuracy and completeness, and I agree with the above. - Reuben Likes, MD

## 2021-06-12 DIAGNOSIS — G4733 Obstructive sleep apnea (adult) (pediatric): Secondary | ICD-10-CM | POA: Diagnosis not present

## 2021-06-25 ENCOUNTER — Other Ambulatory Visit: Payer: Self-pay

## 2021-06-25 ENCOUNTER — Encounter (INDEPENDENT_AMBULATORY_CARE_PROVIDER_SITE_OTHER): Payer: Self-pay | Admitting: Family Medicine

## 2021-06-25 ENCOUNTER — Ambulatory Visit (INDEPENDENT_AMBULATORY_CARE_PROVIDER_SITE_OTHER): Payer: BC Managed Care – PPO | Admitting: Family Medicine

## 2021-06-25 VITALS — BP 101/70 | HR 106 | Temp 97.9°F | Ht 69.0 in | Wt 315.0 lb

## 2021-06-25 DIAGNOSIS — Z9189 Other specified personal risk factors, not elsewhere classified: Secondary | ICD-10-CM

## 2021-06-25 DIAGNOSIS — R7303 Prediabetes: Secondary | ICD-10-CM

## 2021-06-25 DIAGNOSIS — I10 Essential (primary) hypertension: Secondary | ICD-10-CM

## 2021-06-25 DIAGNOSIS — E538 Deficiency of other specified B group vitamins: Secondary | ICD-10-CM | POA: Diagnosis not present

## 2021-06-25 DIAGNOSIS — E782 Mixed hyperlipidemia: Secondary | ICD-10-CM | POA: Diagnosis not present

## 2021-06-25 DIAGNOSIS — E559 Vitamin D deficiency, unspecified: Secondary | ICD-10-CM

## 2021-06-25 DIAGNOSIS — Z6841 Body Mass Index (BMI) 40.0 and over, adult: Secondary | ICD-10-CM

## 2021-06-25 NOTE — Progress Notes (Signed)
Chief Complaint:   OBESITY Dakota Hernandez is here to discuss his progress with his obesity treatment plan along with follow-up of his obesity related diagnoses. Dakota Hernandez is on the Category 4 Plan and states he is following his eating plan approximately 60-65% of the time. Dakota Hernandez states he is doing resistance bands 10-15 minutes 2 times per week.  Today's visit was #: 8 Starting weight: 382 lbs Starting date: 01/28/2021 Today's weight: 315 lbs Today's date: 06/25/2021 Total lbs lost to date: 67 Total lbs lost since last in-office visit: 7  Interim History: Dakota Hernandez's biggest obstacle over the last few weeks was where he went over calories 3-4 times (not necessarily hungry but wanted to eat). Pt felt when he adds breakfast earlier, then he needs a mid morning snack around 10:30-11 AM. Her initial resting metabolism 2973.  Subjective:   1. Essential hypertension BP very well controlled- borderline low. He denies dizziness or lightheadedness.  2. Mixed hyperlipidemia His last LDL was 128, HDL 49, triglycerides 72, and total 190. He is not on statin therapy.  3. Vitamin D deficiency Pt denies nausea, vomiting, and muscle weakness but notes fatigue. He is on prescription Vit D.  4. Vitamin B12 deficiency Dakota Hernandez reports improving fatigue. He is taking OTC B12 supplement.  5. Pre-diabetes Pt's A1c is 6.2 and insulin level 17.7. He is not on medication.  6. At risk of diabetes mellitus Dakota Hernandez is at higher than average risk for developing diabetes due to obesity.   Assessment/Plan:   1. Essential hypertension Dakota Hernandez is working on healthy weight loss and exercise to improve blood pressure control. We will watch for signs of hypotension as he continues his lifestyle modifications. Pt is to reach out to Dr. Jens Som about decreasing BP meds. Check labs today.  - Comprehensive metabolic panel  2. Mixed hyperlipidemia Cardiovascular risk and specific lipid/LDL goals reviewed.  We discussed  several lifestyle modifications today and Dakota Hernandez will continue to work on diet, exercise and weight loss efforts. Orders and follow up as documented in patient record.   Counseling Intensive lifestyle modifications are the first line treatment for this issue. Dietary changes: Increase soluble fiber. Decrease simple carbohydrates. Exercise changes: Moderate to vigorous-intensity aerobic activity 150 minutes per week if tolerated. Lipid-lowering medications: see documented in medical record. Check labs today.  - Lipid Panel With LDL/HDL Ratio  3. Vitamin D deficiency Low Vitamin D level contributes to fatigue and are associated with obesity, breast, and colon cancer. He agrees to continue to take prescription Vitamin D 50,000 IU every week and will follow-up for routine testing of Vitamin D, at least 2-3 times per year to avoid over-replacement. Check labs today.  - VITAMIN D 25 Hydroxy (Vit-D Deficiency, Fractures)  4. Vitamin B12 deficiency The diagnosis was reviewed with the patient. Counseling provided today, see below. We will continue to monitor. Orders and follow up as documented in patient record.  Counseling The body needs vitamin B12: to make red blood cells; to make DNA; and to help the nerves work properly so they can carry messages from the brain to the body.  The main causes of vitamin B12 deficiency include dietary deficiency, digestive diseases, pernicious anemia, and having a surgery in which part of the stomach or small intestine is removed.  Certain medicines can make it harder for the body to absorb vitamin B12. These medicines include: heartburn medications; some antibiotics; some medications used to treat diabetes, gout, and high cholesterol.  In some cases, there are no symptoms  of this condition. If the condition leads to anemia or nerve damage, various symptoms can occur, such as weakness or fatigue, shortness of breath, and numbness or tingling in your hands and feet.    Treatment:  May include taking vitamin B12 supplements.  Avoid alcohol.  Eat lots of healthy foods that contain vitamin B12: Beef, pork, chicken, [place], and organ meats, such as liver.  Seafood: This includes clams, rainbow trout, salmon, tuna, and haddock. Eggs.  Cereal and dairy products that are fortified: This means that vitamin B12 has been added to the food.  Check labs today.  - Vitamin B12  5. Pre-diabetes Dakota Hernandez will continue to work on weight loss, exercise, and decreasing simple carbohydrates to help decrease the risk of diabetes.  Check labs today.  - Hemoglobin A1c - Insulin, random  6. At risk of diabetes mellitus Dakota Hernandez was given approximately 15 minutes of diabetes education and counseling today. We discussed intensive lifestyle modifications today with an emphasis on weight loss as well as increasing exercise and decreasing simple carbohydrates in his diet. We also reviewed medication options with an emphasis on risk versus benefit of those discussed.   Repetitive spaced learning was employed today to elicit superior memory formation and behavioral change.  7. Obesity with current BMI of 46.6  Dakota Hernandez is currently in the action stage of change. As such, his goal is to continue with weight loss efforts. He has agreed to the Category 4 Plan + 200 calories.   Exercise goals: All adults should avoid inactivity. Some physical activity is better than none, and adults who participate in any amount of physical activity gain some health benefits.  Behavioral modification strategies: increasing lean protein intake, meal planning and cooking strategies, and keeping healthy foods in the home.  Dakota Hernandez has agreed to follow-up with our clinic in 3-4 weeks. He was informed of the importance of frequent follow-up visits to maximize his success with intensive lifestyle modifications for his multiple health conditions.   Dakota Hernandez was informed we would discuss his lab results at his  next visit unless there is a critical issue that needs to be addressed sooner. Dakota Hernandez agreed to keep his next visit at the agreed upon time to discuss these results.  Objective:   Blood pressure 101/70, pulse (!) 106, temperature 97.9 F (36.6 C), height 5\' 9"  (1.753 m), weight (!) 315 lb (142.9 kg), SpO2 96 %. Body mass index is 46.52 kg/m.  General: Cooperative, alert, well developed, in no acute distress. HEENT: Conjunctivae and lids unremarkable. Cardiovascular: Regular rhythm.  Lungs: Normal work of breathing. Neurologic: No focal deficits.   Lab Results  Component Value Date   CREATININE 1.04 01/28/2021   BUN 15 01/28/2021   NA 143 01/28/2021   K 4.2 01/28/2021   CL 105 01/28/2021   CO2 23 01/28/2021   Lab Results  Component Value Date   ALT 17 01/28/2021   AST 20 01/28/2021   ALKPHOS 84 01/28/2021   BILITOT 1.0 01/28/2021   Lab Results  Component Value Date   HGBA1C 6.2 (H) 01/28/2021   HGBA1C 6.0 10/30/2020   HGBA1C 5.7 (H) 04/15/2020   Lab Results  Component Value Date   INSULIN 17.7 01/28/2021   Lab Results  Component Value Date   TSH 0.875 01/28/2021   Lab Results  Component Value Date   CHOL 190 01/28/2021   HDL 49 01/28/2021   LDLCALC 128 (H) 01/28/2021   TRIG 72 01/28/2021   CHOLHDL 4.0 CALC 06/14/2008  Lab Results  Component Value Date   VD25OH 11.0 (L) 01/28/2021   VD25OH 14 (L) 04/29/2020   Lab Results  Component Value Date   WBC 5.1 01/28/2021   HGB 13.8 01/28/2021   HCT 42.0 01/28/2021   MCV 88 01/28/2021   PLT 171.0 10/30/2020    Attestation Statements:   Reviewed by clinician on day of visit: allergies, medications, problem list, medical history, surgical history, family history, social history, and previous encounter notes.  Edmund Hilda, CMA, am acting as transcriptionist for Reuben Likes, MD.  I have reviewed the above documentation for accuracy and completeness, and I agree with the above. - Reuben Likes, MD

## 2021-06-26 LAB — COMPREHENSIVE METABOLIC PANEL
ALT: 13 IU/L (ref 0–44)
AST: 15 IU/L (ref 0–40)
Albumin/Globulin Ratio: 1.5 (ref 1.2–2.2)
Albumin: 4.1 g/dL (ref 3.8–4.9)
Alkaline Phosphatase: 74 IU/L (ref 44–121)
BUN/Creatinine Ratio: 16 (ref 9–20)
BUN: 19 mg/dL (ref 6–24)
Bilirubin Total: 1 mg/dL (ref 0.0–1.2)
CO2: 24 mmol/L (ref 20–29)
Calcium: 8.9 mg/dL (ref 8.7–10.2)
Chloride: 104 mmol/L (ref 96–106)
Creatinine, Ser: 1.22 mg/dL (ref 0.76–1.27)
Globulin, Total: 2.7 g/dL (ref 1.5–4.5)
Glucose: 89 mg/dL (ref 65–99)
Potassium: 4.6 mmol/L (ref 3.5–5.2)
Sodium: 142 mmol/L (ref 134–144)
Total Protein: 6.8 g/dL (ref 6.0–8.5)
eGFR: 70 mL/min/{1.73_m2} (ref >59)

## 2021-06-26 LAB — LIPID PANEL WITH LDL/HDL RATIO
Cholesterol, Total: 167 mg/dL (ref 100–199)
HDL: 39 mg/dL — ABNORMAL LOW (ref >39)
LDL Chol Calc (NIH): 114 mg/dL — ABNORMAL HIGH (ref 0–99)
LDL/HDL Ratio: 2.9 ratio (ref 0.0–3.6)
Triglycerides: 75 mg/dL (ref 0–149)
VLDL Cholesterol Cal: 14 mg/dL (ref 5–40)

## 2021-06-26 LAB — VITAMIN D 25 HYDROXY (VIT D DEFICIENCY, FRACTURES): Vit D, 25-Hydroxy: 55.6 ng/mL (ref 30.0–100.0)

## 2021-06-26 LAB — VITAMIN B12: Vitamin B-12: 139 pg/mL — ABNORMAL LOW (ref 232–1245)

## 2021-06-26 LAB — INSULIN, RANDOM: INSULIN: 8.4 u[IU]/mL (ref 2.6–24.9)

## 2021-06-26 LAB — HEMOGLOBIN A1C
Est. average glucose Bld gHb Est-mCnc: 114 mg/dL
Hgb A1c MFr Bld: 5.6 % (ref 4.8–5.6)

## 2021-07-07 ENCOUNTER — Other Ambulatory Visit (INDEPENDENT_AMBULATORY_CARE_PROVIDER_SITE_OTHER): Payer: Self-pay | Admitting: Family Medicine

## 2021-07-07 DIAGNOSIS — E559 Vitamin D deficiency, unspecified: Secondary | ICD-10-CM

## 2021-07-10 ENCOUNTER — Other Ambulatory Visit: Payer: Self-pay | Admitting: Cardiology

## 2021-07-10 DIAGNOSIS — R601 Generalized edema: Secondary | ICD-10-CM

## 2021-07-12 DIAGNOSIS — G4733 Obstructive sleep apnea (adult) (pediatric): Secondary | ICD-10-CM | POA: Diagnosis not present

## 2021-07-24 ENCOUNTER — Ambulatory Visit (INDEPENDENT_AMBULATORY_CARE_PROVIDER_SITE_OTHER): Payer: BC Managed Care – PPO | Admitting: Family Medicine

## 2021-08-07 ENCOUNTER — Other Ambulatory Visit: Payer: Self-pay

## 2021-08-07 ENCOUNTER — Encounter (INDEPENDENT_AMBULATORY_CARE_PROVIDER_SITE_OTHER): Payer: Self-pay | Admitting: Family Medicine

## 2021-08-07 ENCOUNTER — Ambulatory Visit (INDEPENDENT_AMBULATORY_CARE_PROVIDER_SITE_OTHER): Payer: BC Managed Care – PPO | Admitting: Family Medicine

## 2021-08-07 VITALS — BP 90/58 | HR 59 | Temp 97.6°F | Ht 69.0 in | Wt 310.0 lb

## 2021-08-07 DIAGNOSIS — E538 Deficiency of other specified B group vitamins: Secondary | ICD-10-CM | POA: Diagnosis not present

## 2021-08-07 DIAGNOSIS — R7303 Prediabetes: Secondary | ICD-10-CM

## 2021-08-07 DIAGNOSIS — E559 Vitamin D deficiency, unspecified: Secondary | ICD-10-CM | POA: Diagnosis not present

## 2021-08-07 DIAGNOSIS — Z6841 Body Mass Index (BMI) 40.0 and over, adult: Secondary | ICD-10-CM

## 2021-08-07 DIAGNOSIS — I10 Essential (primary) hypertension: Secondary | ICD-10-CM

## 2021-08-07 NOTE — Progress Notes (Signed)
Chief Complaint:   OBESITY Dakota Hernandez is here to discuss his progress with his obesity treatment plan along with follow-up of his obesity related diagnoses. Dakota Hernandez is on the Category 4 Plan + 200 calories and states he is following his eating plan approximately 60% of the time. Dakota Hernandez states he is walking 1 mile and resistance bands 10-15 minutes 2-3 times per week.  Today's visit was #: 9 Starting weight: 382 lbs Starting date: 01/28/2021 Today's weight: 310 lbs Today's date: 08/07/2021 Total lbs lost to date: 72 Total lbs lost since last in-office visit: 5  Interim History: Dakota Hernandez voices that work is gearing up for the busiest time of the year. He is eating breakfast at 6:15 AM and hungry at 10 AM and needing a morning snack. Lunch is fruit cup and a sandwich. Supper is chicken, green beans and vegetables.snack calories are fruit, crackers, and peanut butter. Pt thinks he is using all snack calories. He has no plans yet for the holidays.  Subjective:   1. Vitamin B12 deficiency Dakota Hernandez reports fatigue and hasn't started replacement. His last B12 level was 139.   2. Vitamin D deficiency Dakota Hernandez's Vit D level was 55. Pt denies nausea, vomiting, and muscle weakness but notes fatigue.   3. Essential hypertension BP low today. Pt is on quite a few meds. Pt denies chest pain/chest pressure/headache but reports lightheadedness/dizziness.  4. Pre-diabetes Dakota Hernandez's last A1c improved to 5.6. He is not on meds.  Assessment/Plan:   1. Vitamin B12 deficiency Dakota Hernandez was given [facility] list to start B12 replacement. The diagnosis was reviewed with the patient. Counseling provided today, see below. We will continue to monitor. Orders and follow up as documented in patient record.  Counseling The body needs vitamin B12: to make red blood cells; to make DNA; and to help the nerves work properly so they can carry messages from the brain to the body.  The main causes of vitamin B12  deficiency include dietary deficiency, digestive diseases, pernicious anemia, and having a surgery in which part of the stomach or small intestine is removed.  Certain medicines can make it harder for the body to absorb vitamin B12. These medicines include: heartburn medications; some antibiotics; some medications used to treat diabetes, gout, and high cholesterol.  In some cases, there are no symptoms of this condition. If the condition leads to anemia or nerve damage, various symptoms can occur, such as weakness or fatigue, shortness of breath, and numbness or tingling in your hands and feet.   Treatment:  May include taking vitamin B12 supplements.  Avoid alcohol.  Eat lots of healthy foods that contain vitamin B12: Beef, pork, chicken, [place], and organ meats, such as liver.  Seafood: This includes clams, rainbow trout, salmon, tuna, and haddock. Eggs.  Cereal and dairy products that are fortified: This means that vitamin B12 has been added to the food.   2. Vitamin D deficiency Low Vitamin D level contributes to fatigue and are associated with obesity, breast, and colon cancer. He agrees to discontinue prescription Vitamin D 50,000 IU every week and start OTC Vit D 5,000 IU daily. He will follow-up for routine testing of Vitamin D, at least 2-3 times per year to avoid over-replacement.  3. Essential hypertension Dakota Hernandez is working on healthy weight loss and exercise to improve blood pressure control. We will watch for signs of hypotension as he continues his lifestyle modifications. Pt is to reach out to Dr. Jens Som for further management.  4.  Pre-diabetes Dakota Hernandez will continue to work on weight loss, exercise, and decreasing simple carbohydrates to help decrease the risk of diabetes. At goal. Continue meal plan.  5. Obesity BMI today 45  Dakota Hernandez is currently in the action stage of change. As such, his goal is to continue with weight loss efforts. He has agreed to the Category 4 Plan + 200  calories.   Exercise goals:  As is  Behavioral modification strategies: increasing lean protein intake, meal planning and cooking strategies, keeping healthy foods in the home, and planning for success.  Dakota Hernandez has agreed to follow-up with our clinic in 3-4 weeks. He was informed of the importance of frequent follow-up visits to maximize his success with intensive lifestyle modifications for his multiple health conditions.   Objective:   Blood pressure (!) 90/58, pulse (!) 59, temperature 97.6 F (36.4 C), height 5\' 9"  (1.753 m), weight (!) 310 lb (140.6 kg), SpO2 97 %. Body mass index is 45.78 kg/m.  General: Cooperative, alert, well developed, in no acute distress. HEENT: Conjunctivae and lids unremarkable. Cardiovascular: Regular rhythm.  Lungs: Normal work of breathing. Neurologic: No focal deficits.   Lab Results  Component Value Date   CREATININE 1.22 06/25/2021   BUN 19 06/25/2021   NA 142 06/25/2021   K 4.6 06/25/2021   CL 104 06/25/2021   CO2 24 06/25/2021   Lab Results  Component Value Date   ALT 13 06/25/2021   AST 15 06/25/2021   ALKPHOS 74 06/25/2021   BILITOT 1.0 06/25/2021   Lab Results  Component Value Date   HGBA1C 5.6 06/25/2021   HGBA1C 6.2 (H) 01/28/2021   HGBA1C 6.0 10/30/2020   HGBA1C 5.7 (H) 04/15/2020   Lab Results  Component Value Date   INSULIN 8.4 06/25/2021   INSULIN 17.7 01/28/2021   Lab Results  Component Value Date   TSH 0.875 01/28/2021   Lab Results  Component Value Date   CHOL 167 06/25/2021   HDL 39 (L) 06/25/2021   LDLCALC 114 (H) 06/25/2021   TRIG 75 06/25/2021   CHOLHDL 4.0 CALC 06/14/2008   Lab Results  Component Value Date   VD25OH 55.6 06/25/2021   VD25OH 11.0 (L) 01/28/2021   VD25OH 14 (L) 04/29/2020   Lab Results  Component Value Date   WBC 5.1 01/28/2021   HGB 13.8 01/28/2021   HCT 42.0 01/28/2021   MCV 88 01/28/2021   PLT 171.0 10/30/2020    Attestation Statements:   Reviewed by clinician on day  of visit: allergies, medications, problem list, medical history, surgical history, family history, social history, and previous encounter notes.  11/01/2020, CMA, am acting as transcriptionist for Edmund Hilda, MD.  I have reviewed the above documentation for accuracy and completeness, and I agree with the above. - Reuben Likes, MD

## 2021-08-07 NOTE — Telephone Encounter (Signed)
Patient getting back to you about appointment

## 2021-08-07 NOTE — Telephone Encounter (Signed)
Been over 1 year since last seen.. requesting possible med changes

## 2021-08-11 NOTE — Progress Notes (Signed)
HPI: FU atrial fibrillation.  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  He has had previous cardioversions but did not hold sinus rhythm.  Pt is now treated with rate control and anticoagulation as he is asymptomatic. CTA 7/21 showed no pulmonary embolus. Last echocardiogram June 2021 showed normal LV function, moderate left atrial enlargement, mild RAE, moderate right ventricular enlargement, mild to moderate MR. Recently DCed following admission for possible cellulitis and respiratory failure. Since last seen, he has lost 70 pounds recently.  He denies dyspnea, chest pain, palpitations, syncope or bleeding.  Current Outpatient Medications  Medication Sig Dispense Refill   diltiazem (CARDIZEM CD) 120 MG 24 hr capsule TAKE 1 CAPSULE BY MOUTH  DAILY 90 capsule 3   furosemide (LASIX) 20 MG tablet TAKE 1 TABLET BY MOUTH  DAILY 30 tablet 0   metoprolol (TOPROL-XL) 200 MG 24 hr tablet TAKE 1 TABLET BY MOUTH  DAILY 90 tablet 3   omeprazole (PRILOSEC OTC) 20 MG tablet Take 20 mg by mouth daily before breakfast.      triamcinolone cream (KENALOG) 0.1 % Apply 1 application topically 2 (two) times daily. 160 g 3   vitamin B-12 (CYANOCOBALAMIN) 1000 MCG tablet Take 1 tablet (1,000 mcg total) by mouth daily. 90 tablet 0   Vitamin D, Ergocalciferol, (DRISDOL) 1.25 MG (50000 UNIT) CAPS capsule Take 1 capsule (50,000 Units total) by mouth every 7 (seven) days. 8 capsule 0   acetaminophen (TYLENOL) 500 MG tablet Take 1,000 mg by mouth every 6 (six) hours as needed for mild pain or fever. (Patient not taking: Reported on 08/13/2021)     apixaban (ELIQUIS) 5 MG TABS tablet Take 1 tablet (5 mg total) by mouth 2 (two) times daily. (Patient not taking: Reported on 08/13/2021) 60 tablet 6   doxycycline (VIBRA-TABS) 100 MG tablet Take 1 tablet (100 mg total) by mouth 2 (two) times daily. (Patient not taking: Reported on 08/13/2021) 20 tablet 0   No  current facility-administered medications for this visit.     Past Medical History:  Diagnosis Date   Atrial fibrillation (HCC)    a. s/p multiple DCCV's ---> now rate-controlled. On Eliquis for anticoagulation   Cardiomyopathy (HCC)    a. EF previously at 25-30% (Tachycardia-mediated?) --> at 55-60% by echo in 03/2016   Cellulitis and abscess of right leg    Diabetes mellitus without complication (HCC)    ED (erectile dysfunction)    Edema of knee    Hypertension    Hypogonadism male    Joint pain    Leg cramp    Muscle pain    Obesity    Obesity    OSA (obstructive sleep apnea)    a. on CPAP   Other fatigue    Pre-diabetes    SOB (shortness of breath)    SOB (shortness of breath) on exertion    Vitamin B12 deficiency    Vitamin D deficiency     Past Surgical History:  Procedure Laterality Date   APPENDECTOMY  1981   CARDIOVERSION N/A 03/24/2013   Procedure: CARDIOVERSION;  Surgeon: Lewayne Bunting, MD;  Location: [facility] ENDOSCOPY;  Service: Cardiovascular;  Laterality: N/A;   EYE SURGERY  in 1st grade   Left eye   LAPAROSCOPIC GASTRIC SLEEVE RESECTION  8/13    Social History   Socioeconomic History   Marital status: Divorced    Spouse name: [name]   Number of children: N  Years of education: Not on file   Highest education level: Not on file  Occupational History   Occupation: Chiropractor: [employer]    Employer: [employer]  Tobacco Use   Smoking status: Never   Smokeless tobacco: Never  Vaping Use   Vaping Use: Never used  Substance and Sexual Activity   Alcohol use: No   Drug use: No   Sexual activity: Yes  Other Topics Concern   Not on file  Social History Narrative   Got married 08/2010      Regular Exercise- yes         Social Determinants of Health   Financial Resource Strain: Not on file  Food Insecurity: Not on file  Transportation Needs: Not on file  Physical Activity: Not on file  Stress: Not on file  Social  Connections: Not on file  Intimate Partner Violence: Not on file    Family History  Problem Relation Age of Onset   Heart attack Father 23   Heart disease Father 45   Hypertension Father    Heart failure Father    Sudden death Father    Heart attack Other        cousin   Heart disease Other    Stroke Mother    Obesity Mother     ROS: no fevers or chills, productive cough, hemoptysis, dysphasia, odynophagia, melena, hematochezia, dysuria, hematuria, rash, seizure activity, orthopnea, PND, pedal edema, claudication. Remaining systems are negative.  Physical Exam: Well-developed well-nourished in no acute distress.  Skin is warm and dry.  HEENT is normal.  Neck is supple.  Chest is clear to auscultation with normal expansion.  Cardiovascular exam is irregular Abdominal exam nontender or distended. No masses palpated. Extremities show no edema. neuro grossly intact   A/P  1 permanent atrial fibrillation-we will continue Cardizem and Toprol at present dose for rate control.  Continue apixaban.  2 hypertension-patient's blood pressure is controlled.  His blood pressure was low recently and his lisinopril was discontinued for that reason.  3 cardiomyopathy-LV function has normalized on most recent echocardiogram.  Previous cardiomyopathy felt likely tachycardia mediated.    4 obstructive sleep apnea-continue CPAP.  5 morbid obesity-he has lost 70 pounds recently and continues efforts at further weight loss.  6 history of lower extremity edema-continue Lasix at present dose.  Kirk Ruths, MD

## 2021-08-12 DIAGNOSIS — G4733 Obstructive sleep apnea (adult) (pediatric): Secondary | ICD-10-CM | POA: Diagnosis not present

## 2021-08-13 ENCOUNTER — Other Ambulatory Visit: Payer: Self-pay | Admitting: Cardiology

## 2021-08-13 ENCOUNTER — Encounter: Payer: Self-pay | Admitting: Cardiology

## 2021-08-13 ENCOUNTER — Ambulatory Visit (INDEPENDENT_AMBULATORY_CARE_PROVIDER_SITE_OTHER): Payer: BC Managed Care – PPO | Admitting: Cardiology

## 2021-08-13 ENCOUNTER — Other Ambulatory Visit: Payer: Self-pay

## 2021-08-13 VITALS — BP 118/82 | HR 57 | Ht 69.0 in | Wt 313.8 lb

## 2021-08-13 DIAGNOSIS — I4819 Other persistent atrial fibrillation: Secondary | ICD-10-CM

## 2021-08-13 DIAGNOSIS — I420 Dilated cardiomyopathy: Secondary | ICD-10-CM

## 2021-08-13 DIAGNOSIS — G4733 Obstructive sleep apnea (adult) (pediatric): Secondary | ICD-10-CM

## 2021-08-13 NOTE — Patient Instructions (Signed)

## 2021-08-24 ENCOUNTER — Other Ambulatory Visit: Payer: Self-pay | Admitting: Cardiology

## 2021-08-24 DIAGNOSIS — R601 Generalized edema: Secondary | ICD-10-CM

## 2021-09-01 ENCOUNTER — Ambulatory Visit (INDEPENDENT_AMBULATORY_CARE_PROVIDER_SITE_OTHER): Payer: BC Managed Care – PPO | Admitting: Family Medicine

## 2021-09-01 ENCOUNTER — Encounter (INDEPENDENT_AMBULATORY_CARE_PROVIDER_SITE_OTHER): Payer: Self-pay | Admitting: Family Medicine

## 2021-09-01 ENCOUNTER — Other Ambulatory Visit: Payer: Self-pay

## 2021-09-01 VITALS — BP 107/74 | HR 70 | Temp 98.0°F | Ht 69.0 in | Wt 305.0 lb

## 2021-09-01 DIAGNOSIS — I10 Essential (primary) hypertension: Secondary | ICD-10-CM | POA: Diagnosis not present

## 2021-09-01 DIAGNOSIS — E538 Deficiency of other specified B group vitamins: Secondary | ICD-10-CM

## 2021-09-01 DIAGNOSIS — Z6841 Body Mass Index (BMI) 40.0 and over, adult: Secondary | ICD-10-CM

## 2021-09-01 NOTE — Progress Notes (Signed)
Chief Complaint:   OBESITY Dakota Hernandez is here to discuss his progress with his obesity treatment plan along with follow-up of his obesity related diagnoses. Dakota Hernandez is on the Category 4 Plan + 200 calories and states he is following his eating plan approximately 70-75% of the time. Dakota Hernandez states he is walking and resistance bands 5-15 minutes 2-3 times per week.  Today's visit was #: 10 Starting weight: 382 lbs Starting date: 01/28/2021 Today's weight: 305 lbs Today's date: 09/01/2021 Total lbs lost to date: 77 Total lbs lost since last in-office visit: 5  Interim History: Dakota Hernandez has had a busy last few weeks, work wise. He is following the plan fairly consistently and focusing on protein intake. He is getting in fruit for snack calories daily with occasional nabs. Over the next few weeks, pt only has work scheduled and then family Thanksgiving dinner in 5 days.  Subjective:   1. Essential hypertension BP is better today. Pt stopped taking lisinopril. He denies dizziness or lightheadedness.  2. Vitamin B12 deficiency Dakota Hernandez's last B12 was 139. He was previously encouraged to take an OTC B12 supplement. He reports fatigue.  Assessment/Plan:   1. Essential hypertension Dakota Hernandez is working on healthy weight loss and exercise to improve blood pressure control. We will watch for signs of hypotension as he continues his lifestyle modifications. Follow up on BP at next appt.  2. Vitamin B12 deficiency The diagnosis was reviewed with the patient. Counseling provided today, see below. We will continue to monitor. Orders and follow up as documented in patient record. Pt is to take an OTC B12 supplement daily.  Counseling The body needs vitamin B12: to make red blood cells; to make DNA; and to help the nerves work properly so they can carry messages from the brain to the body.  The main causes of vitamin B12 deficiency include dietary deficiency, digestive diseases, pernicious anemia, and  having a surgery in which part of the stomach or small intestine is removed.  Certain medicines can make it harder for the body to absorb vitamin B12. These medicines include: heartburn medications; some antibiotics; some medications used to treat diabetes, gout, and high cholesterol.  In some cases, there are no symptoms of this condition. If the condition leads to anemia or nerve damage, various symptoms can occur, such as weakness or fatigue, shortness of breath, and numbness or tingling in your hands and feet.   Treatment:  May include taking vitamin B12 supplements.  Avoid alcohol.  Eat lots of healthy foods that contain vitamin B12: Beef, pork, chicken, [place], and organ meats, such as liver.  Seafood: This includes clams, rainbow trout, salmon, tuna, and haddock. Eggs.  Cereal and dairy products that are fortified: This means that vitamin B12 has been added to the food.   3. Obesity with current BMI of 45.1  Dakota Hernandez is currently in the action stage of change. As such, his goal is to continue with weight loss efforts. He has agreed to the Category 4 Plan + 200 calories.   Exercise goals:  As is  Behavioral modification strategies: increasing lean protein intake, meal planning and cooking strategies, keeping healthy foods in the home, and planning for success.  Dakota Hernandez has agreed to follow-up with our clinic in 3 weeks. He was informed of the importance of frequent follow-up visits to maximize his success with intensive lifestyle modifications for his multiple health conditions.   Objective:   Blood pressure 107/74, pulse 70, temperature 98 F (36.7 C), height  5\' 9"  (1.753 m), weight (!) 305 lb (138.3 kg), SpO2 97 %. Body mass index is 45.04 kg/m.  General: Cooperative, alert, well developed, in no acute distress. HEENT: Conjunctivae and lids unremarkable. Cardiovascular: Regular rhythm.  Lungs: Normal work of breathing. Neurologic: No focal deficits.   Lab Results  Component  Value Date   CREATININE 1.22 06/25/2021   BUN 19 06/25/2021   NA 142 06/25/2021   K 4.6 06/25/2021   CL 104 06/25/2021   CO2 24 06/25/2021   Lab Results  Component Value Date   ALT 13 06/25/2021   AST 15 06/25/2021   ALKPHOS 74 06/25/2021   BILITOT 1.0 06/25/2021   Lab Results  Component Value Date   HGBA1C 5.6 06/25/2021   HGBA1C 6.2 (H) 01/28/2021   HGBA1C 6.0 10/30/2020   HGBA1C 5.7 (H) 04/15/2020   Lab Results  Component Value Date   INSULIN 8.4 06/25/2021   INSULIN 17.7 01/28/2021   Lab Results  Component Value Date   TSH 0.875 01/28/2021   Lab Results  Component Value Date   CHOL 167 06/25/2021   HDL 39 (L) 06/25/2021   LDLCALC 114 (H) 06/25/2021   TRIG 75 06/25/2021   CHOLHDL 4.0 CALC 06/14/2008   Lab Results  Component Value Date   VD25OH 55.6 06/25/2021   VD25OH 11.0 (L) 01/28/2021   VD25OH 14 (L) 04/29/2020   Lab Results  Component Value Date   WBC 5.1 01/28/2021   HGB 13.8 01/28/2021   HCT 42.0 01/28/2021   MCV 88 01/28/2021   PLT 171.0 10/30/2020    Attestation Statements:   Reviewed by clinician on day of visit: allergies, medications, problem list, medical history, surgical history, family history, social history, and previous encounter notes.  11/01/2020, CMA, am acting as transcriptionist for Edmund Hilda, MD.   I have reviewed the above documentation for accuracy and completeness, and I agree with the above. - Reuben Likes, MD

## 2021-09-08 ENCOUNTER — Ambulatory Visit (HOSPITAL_COMMUNITY): Payer: BC Managed Care – PPO | Attending: Cardiovascular Disease

## 2021-09-08 ENCOUNTER — Other Ambulatory Visit: Payer: Self-pay

## 2021-09-08 DIAGNOSIS — I420 Dilated cardiomyopathy: Secondary | ICD-10-CM | POA: Diagnosis not present

## 2021-09-08 LAB — ECHOCARDIOGRAM COMPLETE: Area-P 1/2: 3.63 cm2

## 2021-09-09 ENCOUNTER — Ambulatory Visit (INDEPENDENT_AMBULATORY_CARE_PROVIDER_SITE_OTHER): Payer: BC Managed Care – PPO | Admitting: Internal Medicine

## 2021-09-09 ENCOUNTER — Encounter: Payer: Self-pay | Admitting: Internal Medicine

## 2021-09-09 VITALS — BP 128/82 | HR 88 | Temp 98.2°F | Ht 69.0 in | Wt 313.0 lb

## 2021-09-09 DIAGNOSIS — I420 Dilated cardiomyopathy: Secondary | ICD-10-CM

## 2021-09-09 DIAGNOSIS — E538 Deficiency of other specified B group vitamins: Secondary | ICD-10-CM

## 2021-09-09 DIAGNOSIS — I4821 Permanent atrial fibrillation: Secondary | ICD-10-CM | POA: Diagnosis not present

## 2021-09-09 DIAGNOSIS — K4090 Unilateral inguinal hernia, without obstruction or gangrene, not specified as recurrent: Secondary | ICD-10-CM | POA: Diagnosis not present

## 2021-09-09 DIAGNOSIS — N50811 Right testicular pain: Secondary | ICD-10-CM | POA: Insufficient documentation

## 2021-09-09 DIAGNOSIS — I10 Essential (primary) hypertension: Secondary | ICD-10-CM | POA: Diagnosis not present

## 2021-09-09 LAB — COMPREHENSIVE METABOLIC PANEL
ALT: 55 U/L — ABNORMAL HIGH (ref 0–53)
AST: 58 U/L — ABNORMAL HIGH (ref 0–37)
Albumin: 3.9 g/dL (ref 3.5–5.2)
Alkaline Phosphatase: 95 U/L (ref 39–117)
BUN: 14 mg/dL (ref 6–23)
CO2: 28 mEq/L (ref 19–32)
Calcium: 9 mg/dL (ref 8.4–10.5)
Chloride: 107 mEq/L (ref 96–112)
Creatinine, Ser: 1.09 mg/dL (ref 0.40–1.50)
GFR: 77.08 mL/min (ref >60.00)
Glucose, Bld: 95 mg/dL (ref 70–99)
Potassium: 4.3 mEq/L (ref 3.5–5.1)
Sodium: 143 mEq/L (ref 135–145)
Total Bilirubin: 0.9 mg/dL (ref 0.2–1.2)
Total Protein: 6.9 g/dL (ref 6.0–8.3)

## 2021-09-09 LAB — VITAMIN B12: Vitamin B-12: 519 pg/mL (ref 211–911)

## 2021-09-09 NOTE — Assessment & Plan Note (Signed)
R scrotal/inguinal hernia - new Surgery ref Info given  No heavy lifting

## 2021-09-09 NOTE — Assessment & Plan Note (Signed)
On B12 

## 2021-09-09 NOTE — Patient Instructions (Addendum)
Inguinal Hernia, Adult ?An inguinal hernia develops when fat or the intestines push through a weak spot in a muscle where the leg meets the lower abdomen (groin). This creates a bulge. This kind of hernia could also be: ?In the scrotum, if you are male. ?In folds of skin around the vagina, if you are male. ?There are three types of inguinal hernias: ?Hernias that can be pushed back into the abdomen (are reducible). This type rarely causes pain. ?Hernias that are not reducible (are incarcerated). ?Hernias that are not reducible and lose their blood supply (are strangulated). This type of hernia requires emergency surgery. ?What are the causes? ?This condition is caused by having a weak spot in the muscles or tissues in your groin. This develops over time. The hernia may poke through the weak spot when you suddenly strain your lower abdominal muscles, such as when you: ?Lift a heavy object. ?Strain to have a bowel movement. Constipation can lead to straining. ?Cough. ?What increases the risk? ?This condition is more likely to develop in: ?Males. ?Pregnant females. ?People who: ?Are overweight. ?Work in jobs that require long periods of standing or heavy lifting. ?Have had an inguinal hernia before. ?Smoke or have lung disease. These factors can lead to long-term (chronic) coughing. ?What are the signs or symptoms? ?Symptoms may depend on the size of the hernia. Often, a small inguinal hernia has no symptoms. Symptoms of a larger hernia may include: ?A bulge in the groin area. This is easier to see when standing. It might not be visible when lying down. ?Pain or burning in the groin. This may get worse when lifting, straining, or coughing. ?A dull ache or a feeling of pressure in the groin. ?An unusual bulge in the scrotum, in males. ?Symptoms of a strangulated inguinal hernia may include: ?A bulge in your groin that is very painful and tender to the touch. ?A bulge that turns red or purple. ?Fever, nausea, and  vomiting. ?Inability to have a bowel movement or to pass gas. ?How is this diagnosed? ?This condition is diagnosed based on your symptoms, your medical history, and a physical exam. Your health care provider may feel your groin area and ask you to cough. ?How is this treated? ?Treatment depends on the size of your hernia and whether you have symptoms. If you do not have symptoms, your health care provider may have you watch your hernia carefully and have you come in for follow-up visits. If your hernia is large or if you have symptoms, you may need surgery to repair the hernia. ?Follow these instructions at home: ?Lifestyle ?Avoid lifting heavy objects. ?Avoid standing for long periods of time. ?Do not use any products that contain nicotine or tobacco. These products include cigarettes, chewing tobacco, and vaping devices, such as e-cigarettes. If you need help quitting, ask your health care provider. ?Maintain a healthy weight. ?Preventing constipation ?You may need to take these actions to prevent or treat constipation: ?Drink enough fluid to keep your urine pale yellow. ?Take over-the-counter or prescription medicines. ?Eat foods that are high in fiber, such as beans, whole grains, and fresh fruits and vegetables. ?Limit foods that are high in fat and processed sugars, such as fried or sweet foods. ?General instructions ?You may try to push the hernia back in place by very gently pressing on it while lying down. Do not try to force the bulge back in if it will not push in easily. ?Watch your hernia for any changes in shape, size, or   color. Get help right away if you notice any changes. ?Take over-the-counter and prescription medicines only as told by your health care provider. ?Keep all follow-up visits. This is important. ?Contact a health care provider if: ?You have a fever or chills. ?You develop new symptoms. ?Your symptoms get worse. ?Get help right away if: ?You have pain in your groin that suddenly gets  worse. ?You have a bulge in your groin that: ?Suddenly gets bigger and does not get smaller. ?Becomes red or purple or painful to the touch. ?You are a man and you have a sudden pain in your scrotum, or the size of your scrotum suddenly changes. ?You cannot push the hernia back in place by very gently pressing on it when you are lying down. ?You have nausea or vomiting that does not go away. ?You have a fast heartbeat. ?You cannot have a bowel movement or pass gas. ?These symptoms may represent a serious problem that is an emergency. Do not wait to see if the symptoms will go away. Get medical help right away. Call your local emergency services (911 in the [place] ?Summary ?An inguinal hernia develops when fat or the intestines push through a weak spot in a muscle where your leg meets your lower abdomen (groin). ?This condition is caused by having a weak spot in muscles or tissues in your groin. ?Symptoms may depend on the size of the hernia, and they may include pain or swelling in your groin. A small inguinal hernia often has no symptoms. ?Treatment may not be needed if you do not have symptoms. If you have symptoms or a large hernia, you may need surgery to repair the hernia. ?Avoid lifting heavy objects. Also, avoid standing for long periods of time. ?This information is not intended to replace advice given to you by your health care provider. Make sure you discuss any questions you have with your health care provider. ?Document Revised: 05/21/2020 Document Reviewed: 05/21/2020 ?Elsevier Patient Education ? 2022 Elsevier Inc. ? ?

## 2021-09-09 NOTE — Assessment & Plan Note (Addendum)
R scrotal/inguinal hernia - new Surgery ref

## 2021-09-09 NOTE — Assessment & Plan Note (Signed)
Cont w/Diltiazem, Toprol, Lasix, Eliquis

## 2021-09-09 NOTE — Assessment & Plan Note (Signed)
Cont w/Diltiazem, Toprol, Eliquis

## 2021-09-09 NOTE — Assessment & Plan Note (Signed)
Better w/wt lossCont w/Diltiazem, Toprol, Lasix

## 2021-09-09 NOTE — Progress Notes (Signed)
Continue with work on AP wonderful  Subjective:  Patient ID: Dakota Hernandez, male    DOB: December 17, 1965  Age: 55 y.o. MRN: [id number]  CC: No chief complaint on file.   HPI Vuong Musa Wengert presents for HTN, obesity, cardiomyopathy. B12 def f/u C/o R testis pain and swelling off and on x 2 months   Outpatient Medications Prior to Visit  Medication Sig Dispense Refill  . acetaminophen (TYLENOL) 500 MG tablet Take 1,000 mg by mouth every 6 (six) hours as needed for mild pain or fever.    [name] apixaban (ELIQUIS) 5 MG TABS tablet Take 1 tablet (5 mg total) by mouth 2 (two) times daily. 60 tablet 6  . diltiazem (CARDIZEM CD) 120 MG 24 hr capsule TAKE 1 CAPSULE BY MOUTH  DAILY 90 capsule 3  . doxycycline (VIBRA-TABS) 100 MG tablet Take 1 tablet (100 mg total) by mouth 2 (two) times daily. 20 tablet 0  . furosemide (LASIX) 20 MG tablet TAKE 1 TABLET BY MOUTH  DAILY 30 tablet 11  . metoprolol (TOPROL-XL) 200 MG 24 hr tablet TAKE 1 TABLET BY MOUTH  DAILY 90 tablet 3  . omeprazole (PRILOSEC OTC) 20 MG tablet Take 20 mg by mouth daily before breakfast.     . triamcinolone cream (KENALOG) 0.1 % Apply 1 application topically 2 (two) times daily. 160 g 3  . vitamin B-12 (CYANOCOBALAMIN) 1000 MCG tablet Take 1 tablet (1,000 mcg total) by mouth daily. 90 tablet 0  . Vitamin D, Ergocalciferol, (DRISDOL) 1.25 MG (50000 UNIT) CAPS capsule Take 1 capsule (50,000 Units total) by mouth every 7 (seven) days. 8 capsule 0   No facility-administered medications prior to visit.    ROS: Review of Systems  Constitutional:  Negative for appetite change, fatigue and unexpected weight change.  HENT:  Negative for congestion, nosebleeds, sneezing, sore throat and trouble swallowing.   Eyes:  Negative for itching and visual disturbance.  Respiratory:  Negative for cough.   Cardiovascular:  Negative for chest pain, palpitations and leg swelling.  Gastrointestinal:  Negative for abdominal distention, abdominal pain,  blood in stool, diarrhea, nausea and rectal pain.  Genitourinary:  Positive for scrotal swelling and testicular pain. Negative for frequency, genital sores and hematuria.  Musculoskeletal:  Negative for back pain, gait problem, joint swelling and neck pain.  Skin:  Negative for rash.  Neurological:  Negative for dizziness, tremors, speech difficulty and weakness.  Psychiatric/Behavioral:  Negative for agitation, dysphoric mood and sleep disturbance. The patient is not nervous/anxious.    Objective:  BP 128/82 (BP Location: Right Arm, Patient Position: Sitting, Cuff Size: Large)   Pulse 88   Temp 98.2 F (36.8 C) (Oral)   Ht 5\' 9"  (1.753 m)   Wt (!) 313 lb (142 kg)   SpO2 98%   BMI 46.22 kg/m   BP Readings from Last 3 Encounters:  09/09/21 128/82  09/01/21 107/74  08/13/21 118/82    Wt Readings from Last 3 Encounters:  09/09/21 (!) 313 lb (142 kg)  09/01/21 (!) 305 lb (138.3 kg)  08/13/21 (!) 313 lb 12.8 oz (142.3 kg)    Physical Exam Constitutional:      General: He is not in acute distress.    Appearance: He is well-developed. He is obese.     Comments: NAD  Eyes:     Conjunctiva/sclera: Conjunctivae normal.     Pupils: Pupils are equal, round, and reactive to light.  Neck:     Thyroid: No thyromegaly.  Vascular: No JVD.  Cardiovascular:     Rate and Rhythm: Normal rate and regular rhythm.     Heart sounds: Normal heart sounds. No murmur heard.   No friction rub. No gallop.  Pulmonary:     Effort: Pulmonary effort is normal. No respiratory distress.     Breath sounds: Normal breath sounds. No wheezing or rales.  Chest:     Chest wall: No tenderness.  Abdominal:     General: Bowel sounds are normal. There is no distension.     Palpations: Abdomen is soft. There is no mass.     Tenderness: There is no abdominal tenderness. There is no guarding or rebound.  Musculoskeletal:        General: No tenderness. Normal range of motion.     Cervical back: Normal range  of motion.  Lymphadenopathy:     Cervical: No cervical adenopathy.  Skin:    General: Skin is warm and dry.     Findings: No rash.  Neurological:     Mental Status: He is alert and oriented to person, place, and time.     Cranial Nerves: No cranial nerve deficit.     Motor: No abnormal muscle tone.     Coordination: Coordination normal.     Gait: Gait normal.     Deep Tendon Reflexes: Reflexes are normal and symmetric.  Psychiatric:        Behavior: Behavior normal.        Thought Content: Thought content normal.        Judgment: Judgment normal.   R scrotal/inguinal hernia - NT mass, reducible Testes - no mass B    A total time of 45 minutes was spent preparing to see the patient, reviewing tests, x-rays, operative reports and outside records - s/p sleeve procedure.  Also, obtaining history and performing comprehensive physical exam.  Additionally, counseling the patient regarding the new scrotal mass/hernia.   Finally, documenting clinical information in the health records, coordination of care, educating the patient.   Lab Results  Component Value Date   WBC 5.1 01/28/2021   HGB 13.8 01/28/2021   HCT 42.0 01/28/2021   PLT 171.0 10/30/2020   GLUCOSE 89 06/25/2021   CHOL 167 06/25/2021   TRIG 75 06/25/2021   HDL 39 (L) 06/25/2021   LDLCALC 114 (H) 06/25/2021   ALT 13 06/25/2021   AST 15 06/25/2021   NA 142 06/25/2021   K 4.6 06/25/2021   CL 104 06/25/2021   CREATININE 1.22 06/25/2021   BUN 19 06/25/2021   CO2 24 06/25/2021   TSH 0.875 01/28/2021   INR 1.2 04/14/2020   HGBA1C 5.6 06/25/2021    No results found.  Assessment & Plan:   Problem List Items Addressed This Visit     Congestive dilated cardiomyopathy ([place])    Cont w/Diltiazem, Toprol, Lasix, Eliquis      Essential hypertension    Better w/wt lossCont w/Diltiazem, Toprol, Lasix       OBESITY, MORBID    BMI 46 - better. Lost 80 lbs      Permanent atrial fibrillation with RVR (HCC)    Cont  w/Diltiazem, Toprol, Eliquis      Testicular pain, right    R scrotal/inguinal hernia - new Surgery ref      Relevant Orders   Comprehensive metabolic panel   Unilateral inguinal hernia without obstruction or gangrene - Primary    R scrotal/inguinal hernia - new Surgery ref Info given  No heavy lifting  Relevant Orders   Ambulatory referral to General Surgery   Vitamin B12 deficiency    On B12      Relevant Orders   Vitamin B12   Comprehensive metabolic panel      No orders of the defined types were placed in this encounter.     Follow-up: Return in about 3 months (around 12/08/2021) for a follow-up visit.  Walker Kehr, MD Blood pressure AP clinic lab still normal.  Good

## 2021-09-09 NOTE — Assessment & Plan Note (Signed)
BMI 46 - better. Lost 80 lbs

## 2021-09-11 ENCOUNTER — Encounter: Payer: Self-pay | Admitting: *Deleted

## 2021-09-11 DIAGNOSIS — G4733 Obstructive sleep apnea (adult) (pediatric): Secondary | ICD-10-CM | POA: Diagnosis not present

## 2021-09-22 ENCOUNTER — Ambulatory Visit (INDEPENDENT_AMBULATORY_CARE_PROVIDER_SITE_OTHER): Payer: BC Managed Care – PPO | Admitting: Family Medicine

## 2021-09-22 ENCOUNTER — Encounter (INDEPENDENT_AMBULATORY_CARE_PROVIDER_SITE_OTHER): Payer: Self-pay | Admitting: Family Medicine

## 2021-09-22 ENCOUNTER — Other Ambulatory Visit: Payer: Self-pay

## 2021-09-22 VITALS — BP 136/90 | HR 71 | Temp 97.8°F | Ht 69.0 in | Wt 306.0 lb

## 2021-09-22 DIAGNOSIS — R7401 Elevation of levels of liver transaminase levels: Secondary | ICD-10-CM

## 2021-09-22 DIAGNOSIS — E538 Deficiency of other specified B group vitamins: Secondary | ICD-10-CM

## 2021-09-22 DIAGNOSIS — Z6841 Body Mass Index (BMI) 40.0 and over, adult: Secondary | ICD-10-CM

## 2021-09-22 DIAGNOSIS — K4090 Unilateral inguinal hernia, without obstruction or gangrene, not specified as recurrent: Secondary | ICD-10-CM | POA: Diagnosis not present

## 2021-09-22 NOTE — Progress Notes (Signed)
Chief Complaint:   OBESITY Dakota Hernandez is here to discuss his progress with his obesity treatment plan along with follow-up of his obesity related diagnoses. Dakota Hernandez is on the Category 4 Plan + 200 calories and states he is following his eating plan approximately 50% of the time. Dakota Hernandez states he is walking and resistance bands 5-10 minutes 1-2 times per week.  Today's visit was #: 11 Starting weight: 382 lbs Starting date: 01/28/2021 Today's weight: 306 lbs Today's date: 09/22/2021 Total lbs lost to date: 76 Total lbs lost since last in-office visit: 0  Interim History: Dakota Hernandez had a hard few weeks with extended hours at work. He has been stopping to get food on the way home. He goes back to regular hours after January 1st. Pt may be planning to have surgery for hernia next year.  Subjective:   1. Vitamin B12 deficiency Lio's labs were within normal limits on 09/08/2021. He is on a B12 supplement.  2. Unilateral inguinal hernia without obstruction or gangrene, recurrence not specified Referral to [facility]. Pt is hoping to have surgery in 2023.  3. Transaminitis AST and ALT were elevated ib 09/08/2021. No imaging showing steatosis. Pt reports Hep C test that was done years ago was negative.  Assessment/Plan:   1. Vitamin B12 deficiency The diagnosis was reviewed with the patient. Counseling provided today, see below. We will continue to monitor. Orders and follow up as documented in patient record. Continue current treatment plan.  Counseling The body needs vitamin B12: to make red blood cells; to make DNA; and to help the nerves work properly so they can carry messages from the brain to the body.  The main causes of vitamin B12 deficiency include dietary deficiency, digestive diseases, pernicious anemia, and having a surgery in which part of the stomach or small intestine is removed.  Certain medicines can make it harder for the body to absorb vitamin B12. These medicines  include: heartburn medications; some antibiotics; some medications used to treat diabetes, gout, and high cholesterol.  In some cases, there are no symptoms of this condition. If the condition leads to anemia or nerve damage, various symptoms can occur, such as weakness or fatigue, shortness of breath, and numbness or tingling in your hands and feet.   Treatment:  May include taking vitamin B12 supplements.  Avoid alcohol.  Eat lots of healthy foods that contain vitamin B12: Beef, pork, chicken, [place], and organ meats, such as liver.  Seafood: This includes clams, rainbow trout, salmon, tuna, and haddock. Eggs.  Cereal and dairy products that are fortified: This means that vitamin B12 has been added to the food.   2. Unilateral inguinal hernia without obstruction or gangrene, recurrence not specified [facility]  3. Transaminitis Follow up CMP in February 2023.  4. Obesity with current BMI of 45.2  [name] is currently in the action stage of change. As such, his goal is to continue with weight loss efforts. He has agreed to the Category 4 Plan + 200 calories.   Exercise goals: All adults should avoid inactivity. Some physical activity is better than none, and adults who participate in any amount of physical activity gain some health benefits.  Behavioral modification strategies: increasing lean protein intake.  Dakota Hernandez has agreed to follow-up with our clinic in 3-4 weeks. He was informed of the importance of frequent follow-up visits to maximize his success with intensive lifestyle modifications for his multiple  health conditions.   Objective:   Blood pressure 136/90, pulse 71, temperature 97.8 F (36.6 C), height 5\' 9"  (1.753 m), weight (!) 306 lb (138.8 kg), SpO2 98 %. Body mass index is 45.19 kg/m.  General: Cooperative, alert, well developed, in no acute distress. HEENT: Conjunctivae and lids  unremarkable. Cardiovascular: Regular rhythm.  Lungs: Normal work of breathing. Neurologic: No focal deficits.   Lab Results  Component Value Date   CREATININE 1.09 09/09/2021   BUN 14 09/09/2021   NA 143 09/09/2021   K 4.3 09/09/2021   CL 107 09/09/2021   CO2 28 09/09/2021   Lab Results  Component Value Date   ALT 55 (H) 09/09/2021   AST 58 (H) 09/09/2021   ALKPHOS 95 09/09/2021   BILITOT 0.9 09/09/2021   Lab Results  Component Value Date   HGBA1C 5.6 06/25/2021   HGBA1C 6.2 (H) 01/28/2021   HGBA1C 6.0 10/30/2020   HGBA1C 5.7 (H) 04/15/2020   Lab Results  Component Value Date   INSULIN 8.4 06/25/2021   INSULIN 17.7 01/28/2021   Lab Results  Component Value Date   TSH 0.875 01/28/2021   Lab Results  Component Value Date   CHOL 167 06/25/2021   HDL 39 (L) 06/25/2021   LDLCALC 114 (H) 06/25/2021   TRIG 75 06/25/2021   CHOLHDL 4.0 CALC 06/14/2008   Lab Results  Component Value Date   VD25OH 55.6 06/25/2021   VD25OH 11.0 (L) 01/28/2021   VD25OH 14 (L) 04/29/2020   Lab Results  Component Value Date   WBC 5.1 01/28/2021   HGB 13.8 01/28/2021   HCT 42.0 01/28/2021   MCV 88 01/28/2021   PLT 171.0 10/30/2020    Attestation Statements:   Reviewed by clinician on day of visit: allergies, medications, problem list, medical history, surgical history, family history, social history, and previous encounter notes.  11/01/2020, CMA, am acting as transcriptionist for Dakota Hilda, MD.  I have reviewed the above documentation for accuracy and completeness, and I agree with the above. - Reuben Likes, MD

## 2021-09-24 ENCOUNTER — Encounter (HOSPITAL_COMMUNITY): Payer: Self-pay

## 2021-09-24 ENCOUNTER — Emergency Department (HOSPITAL_COMMUNITY): Payer: No Typology Code available for payment source

## 2021-09-24 ENCOUNTER — Emergency Department (HOSPITAL_COMMUNITY)
Admission: EM | Admit: 2021-09-24 | Discharge: 2021-09-24 | Disposition: A | Discharge status: Home / Self Care | Payer: No Typology Code available for payment source | Attending: Emergency Medicine | Admitting: Emergency Medicine

## 2021-09-24 DIAGNOSIS — M25552 Pain in left hip: Secondary | ICD-10-CM | POA: Diagnosis not present

## 2021-09-24 DIAGNOSIS — Z79899 Other long term (current) drug therapy: Secondary | ICD-10-CM | POA: Insufficient documentation

## 2021-09-24 DIAGNOSIS — Z23 Encounter for immunization: Secondary | ICD-10-CM | POA: Insufficient documentation

## 2021-09-24 DIAGNOSIS — M25562 Pain in left knee: Secondary | ICD-10-CM

## 2021-09-24 DIAGNOSIS — S8992XA Unspecified injury of left lower leg, initial encounter: Secondary | ICD-10-CM | POA: Insufficient documentation

## 2021-09-24 DIAGNOSIS — Y990 Civilian activity done for income or pay: Secondary | ICD-10-CM | POA: Insufficient documentation

## 2021-09-24 DIAGNOSIS — E119 Type 2 diabetes mellitus without complications: Secondary | ICD-10-CM | POA: Diagnosis not present

## 2021-09-24 DIAGNOSIS — S50811A Abrasion of right forearm, initial encounter: Secondary | ICD-10-CM | POA: Diagnosis not present

## 2021-09-24 DIAGNOSIS — S50311A Abrasion of right elbow, initial encounter: Secondary | ICD-10-CM | POA: Insufficient documentation

## 2021-09-24 DIAGNOSIS — W1789XA Other fall from one level to another, initial encounter: Secondary | ICD-10-CM | POA: Diagnosis not present

## 2021-09-24 DIAGNOSIS — S80919A Unspecified superficial injury of unspecified knee, initial encounter: Secondary | ICD-10-CM | POA: Diagnosis not present

## 2021-09-24 DIAGNOSIS — W19XXXA Unspecified fall, initial encounter: Secondary | ICD-10-CM | POA: Diagnosis not present

## 2021-09-24 DIAGNOSIS — I10 Essential (primary) hypertension: Secondary | ICD-10-CM | POA: Diagnosis not present

## 2021-09-24 DIAGNOSIS — M25762 Osteophyte, left knee: Secondary | ICD-10-CM | POA: Diagnosis not present

## 2021-09-24 MED ORDER — TETANUS-DIPHTH-ACELL PERTUSSIS 5-2.5-18.5 LF-MCG/0.5 IM SUSY
0.5000 mL | PREFILLED_SYRINGE | Freq: Once | INTRAMUSCULAR | Status: AC
  Administered 2021-09-24: 16:00:00 0.5 mL via INTRAMUSCULAR
  Filled 2021-09-24: qty 0.5

## 2021-09-24 MED ORDER — ACETAMINOPHEN 500 MG PO TABS
1000.0000 mg | ORAL_TABLET | Freq: Once | ORAL | Status: AC
  Administered 2021-09-24: 16:00:00 1000 mg via ORAL
  Filled 2021-09-24: qty 2

## 2021-09-24 MED ORDER — METHOCARBAMOL 500 MG PO TABS
500.0000 mg | ORAL_TABLET | Freq: Once | ORAL | Status: AC
  Administered 2021-09-24: 16:00:00 500 mg via ORAL
  Filled 2021-09-24: qty 1

## 2021-09-24 NOTE — Discharge Instructions (Addendum)
[facility] [place] the [facility] today to be evaluated for your injuries after suffering a fall.  The x-ray imaging of your left hip showed no broken bones or dislocations.  The CT scan of your left knee showed no broken bones or dislocations.  Due to your continued pain with standing and walking you are placed in a knee immobilizer and given crutches to remain nonweightbearing.  Please continue to use knee immobilizer and crutches until he can follow-up with orthopedic provider for further evaluation.  Please take Ibuprofen (Advil, motrin) and Tylenol (acetaminophen) to relieve your pain.    You may take up to 600 MG (3 pills) of normal strength ibuprofen every 8 hours as needed.   You make take tylenol, up to 1,000 mg (two extra strength pills) every 8 hours as needed.   It is safe to take ibuprofen and tylenol at the same time as they work differently.   Do not take more than 3,000 mg tylenol in a 24 hour period (not more than one dose every 8 hours.  Please check all medication labels as many medications such as pain and cold medications may contain tylenol.  Do not drink alcohol while taking these medications.  Do not take other NSAID'S while taking ibuprofen (such as aleve or naproxen).  Please take ibuprofen with food to decrease stomach upset.   Get help right away if: Your knee swells, and the swelling gets worse. You cannot move your knee. You have very bad knee pain that does not get better with pain medicine. Numbness/weakness to your arms or legs Bowel or bladder incontinence Syncopal event

## 2021-09-24 NOTE — ED Provider Notes (Signed)
[facility] DEPT Provider Note   CSN: [id number] Arrival date & time: 09/24/21  1452     History No chief complaint on file.   Dakota Hernandez is a 55 y.o. male with a history of atrial fibrillation , diabetes mellitus, hypertension.  Presents the emergency department with complaint of left hip and left knee pain after suffering a mechanical fall.  Patient states that fall occurred today at approximately 1230.  Patient states that he was at work when a pallet shifted.  Patient attempted to move out of the way due to falling palate which caused him to lose his balance and fall into a wall with his right arm.  Patient then states that he twisted his lower body and landed on his left hip.  Patient denies hitting his head or any loss of consciousness.  Patient complains of pain to left hip and left knee when he attempts to weight-bear or ambulate.  Patient has no pain when at rest.  When standing or ambulating patient rates pain 6/10 on the pain scale.  Patient has not attempted any modalities to alleviate his symptoms.  Patient reports superficial abrasion to right forearm and elbow.  Patient is unsure when his last tetanus shot was.  Patient complains of associated burning sensation to his superficial abrasions.  Patient denies any neck pain, back pain, numbness, weakness, saddle anesthesia, bowel/bladder incontinence, facial asymmetry, dysarthria.    Patient reports that he is not on Eliquis.  States that he has not been on this medication for approximately 1 year.  States that this is due to problems with cost.  States that his cardiologist is aware.  HPI     Past Medical History:  Diagnosis Date   Atrial fibrillation ([place])    a. s/p multiple DCCV's ---> now rate-controlled. On Eliquis for anticoagulation   Cardiomyopathy ([place])    a. EF previously at 25-30% (Tachycardia-mediated?) --> at 55-60% by echo in 03/2016   Cellulitis and abscess of right leg     Diabetes mellitus without complication [facility])    ED (erectile dysfunction)    Edema of knee    Hypertension    Hypogonadism male    Joint pain    Leg cramp    Muscle pain    Obesity    Obesity    OSA (obstructive sleep apnea)    a. on CPAP   Other fatigue    Pre-diabetes    SOB (shortness of breath)    SOB (shortness of breath) on exertion    Vitamin B12 deficiency    Vitamin D deficiency     Patient Active Problem List   Diagnosis Date Noted   Testicular pain, right 09/09/2021   Unilateral inguinal hernia without obstruction or gangrene 09/09/2021   Cellulitis 06/12/2020   Cellulitis and abscess of right leg 06/11/2020   Febrile illness 04/14/2020   Dyspnea 04/14/2020   Congestive dilated cardiomyopathy ([place]) 02/14/2013   Vitamin B12 deficiency 01/17/2013   Diet-controlled diabetes mellitus ([place]) 07/15/2012   Permanent atrial fibrillation with RVR ([place]) 07/15/2012   Onychomycosis 01/28/2012   Bladder neck obstruction 01/28/2012   [place]-induced atrial fibrillation 10/01/2011   Hypogonadism male 03/27/2011   Vitamin D deficiency disease 03/27/2011   OSA (obstructive sleep apnea) 03/13/2011   HYPERGLYCEMIA 09/22/2010   VENOUS INSUFFICIENCY, CHRONIC 03/24/2010   FATIGUE 03/24/2010   Edema 03/24/2010   OBESITY, MORBID 02/08/2008   Essential hypertension 02/08/2008    Past Surgical History:  Procedure Laterality Date  APPENDECTOMY  1981   CARDIOVERSION N/A 03/24/2013   Procedure: CARDIOVERSION;  Surgeon: Lelon Perla, MD;  Location: [facility] ENDOSCOPY;  Service: Cardiovascular;  Laterality: N/A;   EYE SURGERY  in 1st grade   Left eye   LAPAROSCOPIC GASTRIC SLEEVE RESECTION  8/13       Family History  Problem Relation Age of Onset   Heart attack Father 36   Heart disease Father 50   Hypertension Father    Heart failure Father    Sudden death Father    Heart attack Other        cousin   Heart disease Other    Stroke Mother    Obesity Mother     Social  History   Tobacco Use   Smoking status: Never   Smokeless tobacco: Never  Vaping Use   Vaping Use: Never used  Substance Use Topics   Alcohol use: No   Drug use: No    Home Medications Prior to Admission medications   Medication Sig Start Date End Date Taking? Authorizing Provider  acetaminophen (TYLENOL) 500 MG tablet Take 1,000 mg by mouth every 6 (six) hours as needed for mild pain or fever.    [provider]  apixaban (ELIQUIS) 5 MG TABS tablet Take 1 tablet (5 mg total) by mouth 2 (two) times daily. 02/01/20   Lelon Perla, MD  cholecalciferol (VITAMIN D3) 25 MCG (1000 UNIT) tablet Take 1,000 Units by mouth daily.    [provider]  diltiazem (CARDIZEM CD) 120 MG 24 hr capsule TAKE 1 CAPSULE BY MOUTH  DAILY 02/25/21   Lelon Perla, MD  doxycycline (VIBRA-TABS) 100 MG tablet Take 1 tablet (100 mg total) by mouth 2 (two) times daily. 05/20/21   Plotnikov, Evie Lacks, MD  furosemide (LASIX) 20 MG tablet TAKE 1 TABLET BY MOUTH  DAILY 08/25/21   Lelon Perla, MD  metoprolol (TOPROL-XL) 200 MG 24 hr tablet TAKE 1 TABLET BY MOUTH  DAILY 08/14/21   Lelon Perla, MD  omeprazole (PRILOSEC OTC) 20 MG tablet Take 20 mg by mouth daily before breakfast.     [provider]  triamcinolone cream (KENALOG) 0.1 % Apply 1 application topically 2 (two) times daily. 07/30/20   Plotnikov, Evie Lacks, MD  vitamin B-12 (CYANOCOBALAMIN) 1000 MCG tablet Take 1 tablet (1,000 mcg total) by mouth daily. 03/27/21   Laqueta Linden, MD  Vitamin D, Ergocalciferol, (DRISDOL) 1.25 MG (50000 UNIT) CAPS capsule Take 1 capsule (50,000 Units total) by mouth every 7 (seven) days. Patient not taking: Reported on 09/22/2021 06/04/21   Laqueta Linden, MD    Allergies    Hydrochlorothiazide  Review of Systems   Review of Systems  Constitutional:  Negative for chills and fever.  HENT:  Negative for facial swelling.   Eyes:  Negative for visual disturbance.   Respiratory:  Negative for shortness of breath.   Cardiovascular:  Negative for chest pain.  Gastrointestinal:  Negative for abdominal pain, nausea and vomiting.  Musculoskeletal:  Positive for arthralgias and gait problem. Negative for back pain and neck pain.  Skin:  Positive for wound. Negative for color change, pallor and rash.  Neurological:  Negative for dizziness, syncope, light-headedness and headaches.  Psychiatric/Behavioral:  Negative for confusion.    Physical Exam Updated Vital Signs BP (!) 119/92 (BP Location: Left Arm)    Pulse 90    Temp 98 F (36.7 C) (Oral)    Resp 18    SpO2  97%   Physical Exam Vitals and nursing note reviewed.  Constitutional:      General: He is not in acute distress.    Appearance: He is not ill-appearing, toxic-appearing or diaphoretic.  HENT:     Head: Normocephalic and atraumatic. No raccoon eyes, Battle's sign, abrasion, contusion, right periorbital erythema, left periorbital erythema or laceration.  Eyes:     General: No scleral icterus.       Right eye: No discharge.        Left eye: No discharge.     Extraocular Movements: Extraocular movements intact.     Conjunctiva/sclera: Conjunctivae normal.     Pupils: Pupils are equal, round, and reactive to light.  Cardiovascular:     Rate and Rhythm: Normal rate.  Pulmonary:     Effort: Pulmonary effort is normal.  Chest:     Chest wall: No mass, lacerations, deformity, swelling, tenderness or crepitus.     Comments: No ecchymosis to abdomen Abdominal:     General: Abdomen is protuberant. There is no distension. There are no signs of injury.     Palpations: Abdomen is soft. There is no mass or pulsatile mass.     Tenderness: There is no abdominal tenderness. There is no guarding or rebound.     Comments: No ecchymosis to abdomen  Musculoskeletal:     Cervical back: No swelling, edema, deformity, erythema, signs of trauma, lacerations, rigidity, spasms, torticollis, tenderness, bony  tenderness or crepitus. No pain with movement. Normal range of motion.     Thoracic back: No swelling, edema, deformity, signs of trauma, lacerations, spasms, tenderness or bony tenderness.     Lumbar back: No swelling, edema, deformity, signs of trauma, lacerations, spasms, tenderness or bony tenderness.     Right hip: No deformity, lacerations, tenderness, bony tenderness or crepitus. Normal range of motion.     Left hip: No deformity, lacerations, tenderness, bony tenderness or crepitus.     Right upper leg: Normal.     Left upper leg: Normal.     Right knee: No swelling, deformity, effusion, erythema, ecchymosis, lacerations, bony tenderness or crepitus. Normal range of motion. No tenderness. Normal alignment.     Left knee: No swelling, deformity, effusion, erythema, ecchymosis, lacerations, bony tenderness or crepitus. Normal range of motion. No tenderness. Normal alignment.     Right lower leg: Normal.     Left lower leg: Normal.     Right ankle: No swelling, deformity, ecchymosis or lacerations. No tenderness. Normal range of motion.     Left ankle: No swelling, deformity, ecchymosis or lacerations. No tenderness. Normal range of motion.     Comments: No midline tenderness or deformity to cervical, thoracic, or lumbar spine.  No tenderness, bony tenderness, or deformity to bilateral upper or lower extremities.    Patient complains of pain to left hip and left knee wound standing and bearing weight on affected limb.  Superficial rations to right forearm and right elbow.  Patient has full range of motion to right elbow.    Skin:    General: Skin is warm and dry.  Neurological:     General: No focal deficit present.     Mental Status: He is alert.     GCS: GCS eye subscore is 4. GCS verbal subscore is 5. GCS motor subscore is 6.     Cranial Nerves: No cranial nerve deficit, dysarthria or facial asymmetry.     Comments: Sensation to light touch intact to bilateral upper and lower  extremities  Psychiatric:        Behavior: Behavior is cooperative.    ED Results / Procedures / Treatments   Labs (all labs ordered are listed, but only abnormal results are displayed) Labs Reviewed - No data to display  EKG None  Radiology CT Knee Left Wo Contrast  Result Date: 09/24/2021 CLINICAL DATA:  Osteophyte versus fracture of the lateral tibial plateau EXAM: CT OF THE LEFT KNEE WITHOUT CONTRAST TECHNIQUE: Multidetector CT imaging of the LEFT knee was performed according to the standard protocol. Multiplanar CT image reconstructions were also generated. COMPARISON:  Radiograph performed earlier on the same date FINDINGS: Bones/Joint/Cartilage Advanced tricompartmental osteoarthritis. There is bone-on-bone articulation with subchondral sclerosis in the medial tibiofemoral compartment with prominent osteophytes. There also prominent osteophytes in the lateral tibiofemoral compartment as well as in the patellofemoral compartment with subchondral sclerosis. There are intra-articular loose bodies about the anterior aspect of the knee joint. Ligaments Suboptimally assessed by CT. Muscles and Tendons Muscles are normal in bulk without evidence of intramuscular hematoma. No evidence of tendon tear. Soft tissues Nonspecific prepatellar soft tissue edema.  No hematoma or seroma. IMPRESSION: 1. Advanced tricompartmental osteoarthritis as detailed above. 2. No evidence of fracture or dislocation. No appreciable joint effusion. Electronically Signed   By: Keane Police D.O.   On: 09/24/2021 17:27   DG Knee Complete 4 Views Left  Result Date: 09/24/2021 CLINICAL DATA:  A 54 year old male presents with mechanical fall and pain to leg and hip. EXAM: LEFT KNEE - COMPLETE 4+ VIEW COMPARISON:  None FINDINGS: Moderate-to-marked tricompartmental osteoarthritic changes greatest in the medial and patellofemoral compartments. No substantial joint effusion. Marginal osteophytes along the lateral joint line.  Subtle lucency about these osteophytes raising the question of mildly displaced fracture along the lateral joint line involving the lateral aspect of the tibial plateau. IMPRESSION: Irregular osteophytes versus subtle nondisplaced or minimally displaced fracture along the lateral tibial plateau at the margin of the joint. No substantial overlying soft tissue swelling or joint effusion. Correlate with point tenderness in this area. Moderate-to-marked tricompartmental osteoarthritic changes greatest in the medial and patellofemoral compartments. Electronically Signed   By: Zetta Bills M.D.   On: 09/24/2021 16:38   DG Hip Unilat W or Wo Pelvis 2-3 Views Left  Result Date: 09/24/2021 CLINICAL DATA:  Fall, hip pain EXAM: DG HIP (WITH OR WITHOUT PELVIS) 2-3V LEFT COMPARISON:  None. FINDINGS: There is no evidence of hip fracture or dislocation. There is no evidence of arthropathy or other focal bone abnormality. IMPRESSION: No acute osseous abnormality identified. Electronically Signed   By: Ofilia Neas M.D.   On: 09/24/2021 16:36    Procedures Procedures   Medications Ordered in ED Medications  acetaminophen (TYLENOL) tablet 1,000 mg (1,000 mg Oral Given 09/24/21 1539)  methocarbamol (ROBAXIN) tablet 500 mg (500 mg Oral Given 09/24/21 1545)  Tdap (BOOSTRIX) injection 0.5 mL (0.5 mLs Intramuscular Given 09/24/21 1540)    ED Course  I have reviewed the triage vital signs and the nursing notes.  Pertinent labs & imaging results that were available during my care of the patient were reviewed by me and considered in my medical decision making (see chart for details).    MDM Rules/Calculators/A&P                          Alert 55 year old male no acute stress, nontoxic-appearing.  Presents emergency department with a chief complaint of left hip and left knee pain after  suffering a mechanical fall.  Patient reports that pain is only present with weightbearing and ambulation.  Patient has no  decreased range of motion to left knee or left hip.  No tenderness, bony tenderness, or deformity to bilateral upper or lower extremities.  Pulse, motor, and sensation intact distally to left leg.  Patient complains of pain when he stands and bears weight on affected leg.  Will obtain x-ray imaging of left hip and left knee.  Patient given Tylenol and Robaxin for pain management.  No midline tenderness deformity to cervical, thoracic, lumbar spine.  Head is atraumatic.  Patient denies any numbness, weakness, saddle anesthesia, bowel/bladder incontinence.  Patient is not on any blood thinners.  Low suspicion for intracranial hemorrhage or spinal injury at this time.  Patient is superficial abrasions to right forearm and elbow.  Patient unsure when his last tetanus shot was.  We will have RN clean wounds.  Will update patient's tetanus shot.  Inform patient that is very important that he be restarted on his Eliquis medication.  Advised him to contact his primary care provider and cardiologist without trouble obtaining this medication.  Due to patient not being on medication x1 year we will hold on reordering patient's Eliquis at this time.  X-ray imaging of left hip shows no acute osseous abnormality X-ray imaging of left knee shows possible nondisplaced or minimally displaced fracture along the lateral tibial plateau.  Will obtain CT imaging to further evaluate for possible tibial plateau fracture.  CT imaging shows no acute osseous abnormality of the left knee.  Patient reports improvement in pain after receiving medications however continues to have marked pain with standing and ambulation.  Due to reports of twisting action with his fall concern for possible ligamentous injury.  Will place patient in knee immobilizer and make nonweightbearing with crutches.  We will have patient follow-up orthopedic provider.  Discussed results, findings, treatment and follow up. Patient advised of return  precautions. Patient verbalized understanding and agreed with plan.      Final Clinical Impression(s) / ED Diagnoses Final diagnoses:  None    Rx / DC Orders ED Discharge Orders     None        Berneice Heinrich 09/24/21 1757    Mancel Bale, MD 09/25/21 [phone]

## 2021-09-24 NOTE — ED Triage Notes (Signed)
Pt arrived via EMS, from work, was unloading boxes from truck, mechanical fall, pain to left arm and left leg/hip

## 2021-10-09 DIAGNOSIS — M25562 Pain in left knee: Secondary | ICD-10-CM | POA: Diagnosis not present

## 2021-10-12 DIAGNOSIS — G4733 Obstructive sleep apnea (adult) (pediatric): Secondary | ICD-10-CM | POA: Diagnosis not present

## 2021-10-17 ENCOUNTER — Encounter (INDEPENDENT_AMBULATORY_CARE_PROVIDER_SITE_OTHER): Payer: Self-pay | Admitting: Family Medicine

## 2021-10-20 ENCOUNTER — Ambulatory Visit (INDEPENDENT_AMBULATORY_CARE_PROVIDER_SITE_OTHER): Payer: BC Managed Care – PPO | Admitting: Family Medicine

## 2021-10-23 ENCOUNTER — Ambulatory Visit (INDEPENDENT_AMBULATORY_CARE_PROVIDER_SITE_OTHER): Payer: BC Managed Care – PPO | Admitting: Family Medicine

## 2021-10-23 ENCOUNTER — Encounter (INDEPENDENT_AMBULATORY_CARE_PROVIDER_SITE_OTHER): Payer: Self-pay | Admitting: Family Medicine

## 2021-10-23 ENCOUNTER — Other Ambulatory Visit: Payer: Self-pay

## 2021-10-23 VITALS — BP 126/70 | HR 99 | Temp 97.4°F | Ht 69.0 in | Wt 302.0 lb

## 2021-10-23 DIAGNOSIS — K4090 Unilateral inguinal hernia, without obstruction or gangrene, not specified as recurrent: Secondary | ICD-10-CM | POA: Diagnosis not present

## 2021-10-23 DIAGNOSIS — Z6841 Body Mass Index (BMI) 40.0 and over, adult: Secondary | ICD-10-CM | POA: Diagnosis not present

## 2021-10-23 DIAGNOSIS — E669 Obesity, unspecified: Secondary | ICD-10-CM

## 2021-10-23 DIAGNOSIS — E559 Vitamin D deficiency, unspecified: Secondary | ICD-10-CM

## 2021-10-23 NOTE — Progress Notes (Signed)
Chief Complaint:   OBESITY Dakota Hernandez is here to discuss his progress with his obesity treatment plan along with follow-up of his obesity related diagnoses. Dakota Hernandez is on the Category 4 Plan and states he is following his eating plan approximately 55-60% of the time. Dakota Hernandez states he is not currently exercising.  Today's visit was #: 12 Starting weight: 382 lbs Starting date: 01/28/2021 Today's weight: 302 lbs Today's date: 10/23/2021 Total lbs lost to date: 80 Total lbs lost since last in-office visit: 4  Interim History: Pt had an accident at work where a pallet fell on him and he was out of work and on crutches for 2 weeks. He is still experiencing some knee soreness but overall better. When he went back to work, more structured. Pt wants to get more on plan over the next few weeks. He has food in the house for plan.  Subjective:   1. Vitamin D deficiency Pt denies nausea, vomiting, and muscle weakness but notes fatigue. He is on OTC Vit D.  2. Unilateral inguinal hernia without obstruction or gangrene, recurrence not specified Pt got a referral to surgery and his appt is 6 days from now.  Assessment/Plan:   1. Vitamin D deficiency Low Vitamin D level contributes to fatigue and are associated with obesity, breast, and colon cancer. He agrees to continue to take OTC Vitamin D daily and will follow-up for routine testing of Vitamin D, at least 2-3 times per year to avoid over-replacement.  2. Unilateral inguinal hernia without obstruction or gangrene, recurrence not specified Follow up on surgical recommendation at next appt.  3. Obesity with current BMI of 44.6  Dakota Hernandez is currently in the action stage of change. As such, his goal is to continue with weight loss efforts. He has agreed to the Category 4 Plan.   Exercise goals: All adults should avoid inactivity. Some physical activity is better than none, and adults who participate in any amount of physical activity gain some  health benefits. As tolerated with recent injuries.  Behavioral modification strategies: increasing lean protein intake.  Dakota Hernandez has agreed to follow-up with our clinic in 3-4 weeks. He was informed of the importance of frequent follow-up visits to maximize his success with intensive lifestyle modifications for his multiple health conditions.   Objective:   Blood pressure 126/70, pulse 99, temperature (!) 97.4 F (36.3 C), height 5\' 9"  (1.753 m), weight (!) 302 lb (137 kg), SpO2 97 %. Body mass index is 44.6 kg/m.  General: Cooperative, alert, well developed, in no acute distress. HEENT: Conjunctivae and lids unremarkable. Cardiovascular: Regular rhythm.  Lungs: Normal work of breathing. Neurologic: No focal deficits.   Lab Results  Component Value Date   CREATININE 1.09 09/09/2021   BUN 14 09/09/2021   NA 143 09/09/2021   K 4.3 09/09/2021   CL 107 09/09/2021   CO2 28 09/09/2021   Lab Results  Component Value Date   ALT 55 (H) 09/09/2021   AST 58 (H) 09/09/2021   ALKPHOS 95 09/09/2021   BILITOT 0.9 09/09/2021   Lab Results  Component Value Date   HGBA1C 5.6 06/25/2021   HGBA1C 6.2 (H) 01/28/2021   HGBA1C 6.0 10/30/2020   HGBA1C 5.7 (H) 04/15/2020   Lab Results  Component Value Date   INSULIN 8.4 06/25/2021   INSULIN 17.7 01/28/2021   Lab Results  Component Value Date   TSH 0.875 01/28/2021   Lab Results  Component Value Date   CHOL 167 06/25/2021  HDL 39 (L) 06/25/2021   LDLCALC 114 (H) 06/25/2021   TRIG 75 06/25/2021   CHOLHDL 4.0 CALC 06/14/2008   Lab Results  Component Value Date   VD25OH 55.6 06/25/2021   VD25OH 11.0 (L) 01/28/2021   VD25OH 14 (L) 04/29/2020   Lab Results  Component Value Date   WBC 5.1 01/28/2021   HGB 13.8 01/28/2021   HCT 42.0 01/28/2021   MCV 88 01/28/2021   PLT 171.0 10/30/2020   Attestation Statements:   Reviewed by clinician on day of visit: allergies, medications, problem list, medical history, surgical history,  family history, social history, and previous encounter notes.  Edmund Hilda, CMA, am acting as transcriptionist for Reuben Likes, MD.   I have reviewed the above documentation for accuracy and completeness, and I agree with the above. - Reuben Likes, MD

## 2021-10-29 ENCOUNTER — Telehealth: Payer: Self-pay

## 2021-10-29 DIAGNOSIS — I4821 Permanent atrial fibrillation: Secondary | ICD-10-CM | POA: Diagnosis not present

## 2021-10-29 DIAGNOSIS — G4733 Obstructive sleep apnea (adult) (pediatric): Secondary | ICD-10-CM | POA: Diagnosis not present

## 2021-10-29 DIAGNOSIS — K4090 Unilateral inguinal hernia, without obstruction or gangrene, not specified as recurrent: Secondary | ICD-10-CM | POA: Diagnosis not present

## 2021-10-29 DIAGNOSIS — E559 Vitamin D deficiency, unspecified: Secondary | ICD-10-CM | POA: Diagnosis not present

## 2021-10-29 NOTE — Telephone Encounter (Signed)
Attempted to call patient regarding pre op clearance that was received that states that patient is not currently taking Eliquis due to insurance issues. Per surgical clearance form he is trying to get insurance straightened out so he can take it.   Unable to reach patient or leave message to inquire more about this issue and if there is anything we can do or provide samples. Will try again latter and will forward to Dr. Ludwig Clarks nurse.

## 2021-10-29 NOTE — Telephone Encounter (Signed)
° °  Pre-operative Risk Assessment    Patient Name: Dakota Hernandez  DOB: 30-Jun-1966 MRN: [id number]      Request for Surgical Clearance    Procedure:   Lap Inguinal Hernia Repair   Date of Surgery:  Clearance TBD                                Surgeon:   Surgeon's Group or Practice Name:  [facility]  Phone number:  [phone] Fax number:  [phone]   Type of Clearance Requested:   - Medical  - Pharmacy:  Hold Apixaban (Eliquis)     Type of Anesthesia:  General    Additional requests/questions:      Signed, Eileen Stanford B Harvard Zeiss   10/29/2021, 5:22 PM

## 2021-10-31 NOTE — Telephone Encounter (Signed)
Unable to reach pt or leave a message  

## 2021-11-03 NOTE — Telephone Encounter (Signed)
Patient with diagnosis of afib on Eliquis for anticoagulation.    Procedure: lap inguinal hernia repair Date of procedure: TBD  CHA2DS2-VASc Score = 2  This indicates a 2.2% annual risk of stroke. The patient's score is based upon: CHF History: 1 HTN History: 1 Diabetes History: 0 Stroke History: 0 Vascular Disease History: 0 Age Score: 0 Gender Score: 0  Has DM listed on problem list but A1c historically in preDM range and most recently in normal range on no meds, will not count.  CrCl >132mL/min using adjusted body weight Platelet count 171K  Per office protocol, patient can hold Eliquis for 2 days prior to procedure.

## 2021-11-03 NOTE — Telephone Encounter (Signed)
Attempted to contact patient as part of preoperative protocol.  Patient not available.  He will need call back.

## 2021-11-07 NOTE — Telephone Encounter (Signed)
° °  Name: Dakota Hernandez  DOB: April 01, 1966  MRN: [id number]   Primary Cardiologist: Olga Millers, MD  Chart reviewed as part of pre-operative protocol coverage. Patient was contacted 11/07/2021 in reference to pre-operative risk assessment for pending surgery as outlined below.  Dakota Hernandez was last seen on 08/13/2021 by Dr. Jens Som.  Since that day, Dakota Hernandez has done well from a cardiac perspective. Per Pharmacy, OK to hold Eliquis 2 days prior to the surgery.   Therefore, based on ACC/AHA guidelines, the patient would be at acceptable risk for the planned procedure without further cardiovascular testing.   The patient was advised that if he develops new symptoms prior to surgery to contact our office to arrange for a follow-up visit, and he verbalized understanding.  I will route this recommendation to the requesting party via Epic fax function and remove from pre-op pool. Please call with questions.  Yuette Putnam David Stall, PA-C 11/07/2021, 8:42 AM

## 2021-11-07 NOTE — Telephone Encounter (Signed)
See pre op note.

## 2021-11-12 DIAGNOSIS — G4733 Obstructive sleep apnea (adult) (pediatric): Secondary | ICD-10-CM | POA: Diagnosis not present

## 2021-11-13 ENCOUNTER — Other Ambulatory Visit: Payer: Self-pay

## 2021-11-13 ENCOUNTER — Ambulatory Visit (INDEPENDENT_AMBULATORY_CARE_PROVIDER_SITE_OTHER): Payer: BC Managed Care – PPO | Admitting: Family Medicine

## 2021-11-13 ENCOUNTER — Encounter (INDEPENDENT_AMBULATORY_CARE_PROVIDER_SITE_OTHER): Payer: Self-pay | Admitting: Family Medicine

## 2021-11-13 VITALS — BP 130/81 | HR 65 | Temp 97.5°F | Ht 69.0 in | Wt 303.0 lb

## 2021-11-13 DIAGNOSIS — Z6841 Body Mass Index (BMI) 40.0 and over, adult: Secondary | ICD-10-CM

## 2021-11-13 DIAGNOSIS — E669 Obesity, unspecified: Secondary | ICD-10-CM

## 2021-11-13 DIAGNOSIS — E559 Vitamin D deficiency, unspecified: Secondary | ICD-10-CM | POA: Diagnosis not present

## 2021-11-13 DIAGNOSIS — I4891 Unspecified atrial fibrillation: Secondary | ICD-10-CM | POA: Diagnosis not present

## 2021-11-17 NOTE — Progress Notes (Signed)
Chief Complaint:   OBESITY Dakota Hernandez is here to discuss his progress with his obesity treatment plan along with follow-up of his obesity related diagnoses. Dakota Hernandez is on the Category 4 Plan and states he is following his eating plan approximately 65-70% of the time. Dakota Hernandez states he is walking 10-15 minutes 1-2 times per week.  Today's visit was #: 13 Starting weight: 382 lbs Starting date: 01/28/2021 Today's weight: 303 lbs Today's date: 11/13/2021 Total lbs lost to date: 79 Total lbs lost since last in-office visit: 0  Interim History: Pt has had a stressful few weeks. He is waiting to hear about hernia surgery date (waiting for cardiac clearance). He recognizes he may have done some stress eating- fried fish, instead of baked or grilled. Pt realizes he has been stopping for food pickup after breakfast before going into work. He has brought some things like chips into the house. On days off, pt is finding himself indulgent at lunch.  Subjective:   1. Vitamin D deficiency Dakota Hernandez denies nausea, vomiting, and muscle weakness but notes fatigue. He is on OTC Vit D 1K IU QD.  2. Atrial fibrillation with RVR (HCC) Pt has not been on Eliquis due to cost. He voices he realizes he needs to call [facility].  Assessment/Plan:   1. Vitamin D deficiency Low Vitamin D level contributes to fatigue and are associated with obesity, breast, and colon cancer. He agrees to continue to take OTC Vitamin D 1,000 IU daily and will follow-up for routine testing of Vitamin D, at least 2-3 times per year to avoid over-replacement.  2. Atrial fibrillation with RVR (HCC) Follow up with Dr. Jens Som- explained to pt that he really NEEDS to be on anticoagulation.  3. Obesity with current BMI of 44.7 Dakota Hernandez is currently in the action stage of change. As such, his goal is to continue with weight loss efforts. He has agreed to the Category 4 Plan.   Exercise goals: All adults should avoid inactivity. Some  physical activity is better than none, and adults who participate in any amount of physical activity gain some health benefits.  Behavioral modification strategies: increasing lean protein intake, meal planning and cooking strategies, keeping healthy foods in the home, and planning for success.  Dakota Hernandez has agreed to follow-up with our clinic in 4 weeks. He was informed of the importance of frequent follow-up visits to maximize his success with intensive lifestyle modifications for his multiple health conditions.   Objective:   Blood pressure 130/81, pulse 65, temperature (!) 97.5 F (36.4 C), height 5\' 9"  (1.753 m), weight (!) 303 lb (137.4 kg), SpO2 97 %. Body mass index is 44.75 kg/m.  General: Cooperative, alert, well developed, in no acute distress. HEENT: Conjunctivae and lids unremarkable. Cardiovascular: Regular rhythm.  Lungs: Normal work of breathing. Neurologic: No focal deficits.   Lab Results  Component Value Date   CREATININE 1.09 09/09/2021   BUN 14 09/09/2021   NA 143 09/09/2021   K 4.3 09/09/2021   CL 107 09/09/2021   CO2 28 09/09/2021   Lab Results  Component Value Date   ALT 55 (H) 09/09/2021   AST 58 (H) 09/09/2021   ALKPHOS 95 09/09/2021   BILITOT 0.9 09/09/2021   Lab Results  Component Value Date   HGBA1C 5.6 06/25/2021   HGBA1C 6.2 (H) 01/28/2021   HGBA1C 6.0 10/30/2020   HGBA1C 5.7 (H) 04/15/2020   Lab Results  Component Value Date   INSULIN 8.4 06/25/2021   INSULIN  17.7 01/28/2021   Lab Results  Component Value Date   TSH 0.875 01/28/2021   Lab Results  Component Value Date   CHOL 167 06/25/2021   HDL 39 (L) 06/25/2021   LDLCALC 114 (H) 06/25/2021   TRIG 75 06/25/2021   CHOLHDL 4.0 CALC 06/14/2008   Lab Results  Component Value Date   VD25OH 55.6 06/25/2021   VD25OH 11.0 (L) 01/28/2021   VD25OH 14 (L) 04/29/2020   Lab Results  Component Value Date   WBC 5.1 01/28/2021   HGB 13.8 01/28/2021   HCT 42.0 01/28/2021   MCV 88  01/28/2021   PLT 171.0 10/30/2020    Attestation Statements:   Reviewed by clinician on day of visit: allergies, medications, problem list, medical history, surgical history, family history, social history, and previous encounter notes.  Edmund Hilda, CMA, am acting as transcriptionist for Reuben Likes, MD.   I have reviewed the above documentation for accuracy and completeness, and I agree with the above. - Reuben Likes, MD

## 2021-11-18 ENCOUNTER — Ambulatory Visit: Payer: Self-pay | Admitting: General Surgery

## 2021-11-18 NOTE — Patient Instructions (Signed)
DUE TO COVID-19 ONLY ONE VISITOR IS ALLOWED TO COME WITH YOU AND STAY IN THE WAITING ROOM ONLY DURING PRE OP AND PROCEDURE.   **NO VISITORS ARE ALLOWED IN THE SHORT STAY AREA OR RECOVERY ROOM!!**  IF YOU WILL BE ADMITTED INTO THE HOSPITAL YOU ARE ALLOWED ONLY TWO SUPPORT PEOPLE DURING VISITATION HOURS ONLY (7 AM -8PM)   The support person(s) must pass our screening, gel in and out, and wear a mask at all times, including in the patients room. Patients must also wear a mask when staff or their support person are in the room. Visitors GUEST BADGE MUST BE WORN VISIBLY  One adult visitor may remain with you overnight and MUST be in the room by 8 P.M.  No visitors under the age of 32. Any visitor under the age of 71 must be accompanied by an adult.        Your procedure is scheduled on: 12/08/21   Report to [facility] Main Entrance    Report to admitting at : 10:00 AM   Call this number if you have problems the morning of surgery [phone]   Do not eat food :After Midnight.   May have liquids until : 9:15 AM   day of surgery  CLEAR LIQUID DIET  Foods Allowed                                                                     Foods Excluded  Water, Black Coffee and tea, regular and decaf                             liquids that you cannot  Plain Jell-O in any flavor  (No red)                                           see through such as: Fruit ices (not with fruit pulp)                                     milk, soups, orange juice              Iced Popsicles (No red)                                    All solid food                                   Apple juices Sports drinks like Gatorade (No red) Lightly seasoned clear broth or consume(fat free) Sugar Sample Menu Breakfast                                Lunch  Supper Cranberry juice                    Beef broth                            Chicken broth Jell-O                                      Grape juice                           Apple juice Coffee or tea                        Jell-O                                      Popsicle                                                Coffee or tea                        Coffee or tea   FOLLOW BOWEL PREP AND ANY ADDITIONAL PRE OP INSTRUCTIONS YOU RECEIVED FROM YOUR SURGEON'S OFFICE!!!   Oral Hygiene is also important to reduce your risk of infection.                                    Remember - BRUSH YOUR TEETH THE MORNING OF SURGERY WITH YOUR REGULAR TOOTHPASTE   Do NOT smoke after Midnight   Take these medicines the morning of surgery with A SIP OF WATER: metoprolol,diltiazem,omeprazole.  DO NOT TAKE ANY ORAL DIABETIC MEDICATIONS DAY OF YOUR SURGERY                              You may not have any metal on your body including hair pins, jewelry, and body piercing             Do not wear lotions, powders, perfumes/cologne, or deodorant              Men may shave face and neck.   Do not bring valuables to the hospital. [facility] IS NOT             RESPONSIBLE   FOR VALUABLES.   Contacts, dentures or bridgework may not be worn into surgery.   Bring small overnight bag day of surgery.    Patients discharged on the day of surgery will not be allowed to drive home.  Someone needs to stay with you for the first 24 hours after anesthesia.   Special Instructions: Bring a copy of your healthcare power of attorney and living will documents         the day of surgery if you haven't scanned them before.              Please read over the following fact sheets you were given: IF YOU  HAVE QUESTIONS ABOUT YOUR PRE-OP INSTRUCTIONS PLEASE CALL [phone]     [facility] - Preparing for Surgery Before surgery, you can play an important role.  Because skin is not sterile, your skin needs to be as free of germs as possible.  You can reduce the number of germs on your skin by washing with CHG (chlorahexidine gluconate) soap before  surgery.  CHG is an antiseptic cleaner which kills germs and bonds with the skin to continue killing germs even after washing. Please DO NOT use if you have an allergy to CHG or antibacterial soaps.  If your skin becomes reddened/irritated stop using the CHG and inform your nurse when you arrive at Short Stay. Do not shave (including legs and underarms) for at least 48 hours prior to the first CHG shower.  You may shave your face/neck. Please follow these instructions carefully:  1.  Shower with CHG Soap the night before surgery and the  morning of Surgery.  2.  If you choose to wash your hair, wash your hair first as usual with your  normal  shampoo.  3.  After you shampoo, rinse your hair and body thoroughly to remove the  shampoo.                           4.  Use CHG as you would any other liquid soap.  You can apply chg directly  to the skin and wash                       Gently with a scrungie or clean washcloth.  5.  Apply the CHG Soap to your body ONLY FROM THE NECK DOWN.   Do not use on face/ open                           Wound or open sores. Avoid contact with eyes, ears mouth and genitals (private parts).                       Wash face,  Genitals (private parts) with your normal soap.             6.  Wash thoroughly, paying special attention to the area where your surgery  will be performed.  7.  Thoroughly rinse your body with warm water from the neck down.  8.  DO NOT shower/wash with your normal soap after using and rinsing off  the CHG Soap.                9.  Pat yourself dry with a clean towel.            10.  Wear clean pajamas.            11.  Place clean sheets on your bed the night of your first shower and do not  sleep with pets. Day of Surgery : Do not apply any lotions/deodorants the morning of surgery.  Please wear clean clothes to the hospital/surgery center.  FAILURE TO FOLLOW THESE INSTRUCTIONS MAY RESULT IN THE CANCELLATION OF YOUR SURGERY PATIENT  SIGNATURE_________________________________  NURSE SIGNATURE__________________________________  ________________________________________________________________________

## 2021-11-18 NOTE — Progress Notes (Signed)
Sent message, via epic in basket, requesting orders in epic from surgeon.  

## 2021-11-19 ENCOUNTER — Encounter (HOSPITAL_COMMUNITY): Payer: Self-pay

## 2021-11-19 ENCOUNTER — Other Ambulatory Visit: Payer: Self-pay

## 2021-11-19 ENCOUNTER — Encounter (INDEPENDENT_AMBULATORY_CARE_PROVIDER_SITE_OTHER): Payer: Self-pay | Admitting: Family Medicine

## 2021-11-19 ENCOUNTER — Encounter (HOSPITAL_COMMUNITY)
Admission: RE | Admit: 2021-11-19 | Discharge: 2021-11-19 | Disposition: A | Discharge status: Home / Self Care | Payer: BC Managed Care – PPO | Source: Ambulatory Visit | Attending: General Surgery | Admitting: General Surgery

## 2021-11-19 VITALS — BP 152/94 | HR 77 | Temp 97.6°F | Resp 16 | Ht 69.0 in | Wt 306.5 lb

## 2021-11-19 DIAGNOSIS — I10 Essential (primary) hypertension: Secondary | ICD-10-CM | POA: Diagnosis not present

## 2021-11-19 DIAGNOSIS — Z01812 Encounter for preprocedural laboratory examination: Secondary | ICD-10-CM | POA: Diagnosis not present

## 2021-11-19 DIAGNOSIS — E119 Type 2 diabetes mellitus without complications: Secondary | ICD-10-CM

## 2021-11-19 HISTORY — DX: Unspecified osteoarthritis, unspecified site: M19.90

## 2021-11-19 HISTORY — DX: Cardiac arrhythmia, unspecified: I49.9

## 2021-11-19 LAB — BASIC METABOLIC PANEL
Anion gap: 4 — ABNORMAL LOW (ref 5–15)
BUN: 20 mg/dL (ref 6–20)
CO2: 27 mmol/L (ref 22–32)
Calcium: 8.8 mg/dL — ABNORMAL LOW (ref 8.9–10.3)
Chloride: 108 mmol/L (ref 98–111)
Creatinine, Ser: 1.03 mg/dL (ref 0.61–1.24)
GFR, Estimated: >60 mL/min (ref >60)
Glucose, Bld: 99 mg/dL (ref 70–99)
Potassium: 4.2 mmol/L (ref 3.5–5.1)
Sodium: 139 mmol/L (ref 135–145)

## 2021-11-19 LAB — GLUCOSE, CAPILLARY: Glucose-Capillary: 98 mg/dL (ref 70–99)

## 2021-11-19 LAB — CBC
HCT: 45.3 % (ref 39.0–52.0)
Hemoglobin: 15.2 g/dL (ref 13.0–17.0)
MCH: 29.9 pg (ref 26.0–34.0)
MCHC: 33.6 g/dL (ref 30.0–36.0)
MCV: 89.2 fL (ref 80.0–100.0)
Platelets: 174 10*3/uL (ref 150–400)
RBC: 5.08 MIL/uL (ref 4.22–5.81)
RDW: 13.2 % (ref 11.5–15.5)
WBC: 4.9 10*3/uL (ref 4.0–10.5)
nRBC: 0 % (ref 0.0–0.2)

## 2021-11-19 LAB — HEMOGLOBIN A1C
Hgb A1c MFr Bld: 5 % (ref 4.8–5.6)
Mean Plasma Glucose: 96.8 mg/dL

## 2021-11-19 NOTE — Progress Notes (Signed)
COVID Vaccine Completed: Yes Date COVID Vaccine completed: 08/22/21 x 3 COVID vaccine manufacturer: Pfizer     COVID Test: N/A Bowel prep reminder: N/A  PCP - Dr. Lew Dawes Cardiologist - Dr. Kirk Ruths. Clearance: Cadence H Furth: PAC.: 11/07/21: EPIC  Chest x-ray -  EKG - 01/28/21 Stress Test -  ECHO - 09/08/21 Cardiac Cath -  Pacemaker/ICD device last checked:  Sleep Study - Yes CPAP - Yes  Fasting Blood Sugar - N/A Checks Blood Sugar ___0__ times a day  Blood Thinner Instructions: Pt. Is not taking Eliquis Aspirin Instructions: Last Dose:  Anesthesia review: Hx: Afib,DIA,HTN,OSA(CPAP)  Patient denies shortness of breath, fever, cough and chest pain at PAT appointment   Patient verbalized understanding of instructions that were given to them at the PAT appointment. Patient was also instructed that they will need to review over the PAT instructions again at home before surgery.

## 2021-11-19 NOTE — Telephone Encounter (Signed)
FYI

## 2021-11-23 ENCOUNTER — Encounter: Payer: Self-pay | Admitting: Internal Medicine

## 2021-11-24 NOTE — Telephone Encounter (Deleted)
No problem,  What day would you like to reschedule your appointment for?

## 2021-11-24 NOTE — Progress Notes (Signed)
Anesthesia Chart Review   Case: [id number] Date/Time: 12/08/21 1200   Procedure: XI ROBOTIC ASSISTED RIGHT INGUINAL HERNIA REPAIR WITH MESH (Right)   Anesthesia type: General   Pre-op diagnosis: RIGHT INGUINAL HERNIA   Location: [facility] ROOM 02 / [facility]   Surgeons: Gaynelle Adu, MD       DISCUSSION:56 y.o. never smoker with h/o HTN, atrial fibrillation (on Eliquis), OSA on CPAP, DM II, right inguinal hernia scheduled for above procedure 12/08/2021 with Dr. Gaynelle Adu.   Per cardiology preoperative evaluation 11/07/2021, "Chart reviewed as part of pre-operative protocol coverage. Patient was contacted 11/07/2021 in reference to pre-operative risk assessment for pending surgery as outlined below.  Dakota Hernandez was last seen on 08/13/2021 by Dr. Jens Som.  Since that day, Dakota Hernandez has done well from a cardiac perspective. Per Pharmacy, OK to hold Eliquis 2 days prior to the surgery.    Therefore, based on ACC/AHA guidelines, the patient would be at acceptable risk for the planned procedure without further cardiovascular testing. "  Anticipate pt can proceed with planned procedure barring acute status change.   VS: BP (!) 152/94    Pulse 77    Temp 36.4 C (Oral)    Resp 16    Ht 5\' 9"  (1.753 m)    Wt (!) 139 kg    SpO2 98%    BMI 45.26 kg/m   PROVIDERS: Plotnikov, , MD  Primary Cardiologist: Georgina Quint, MD LABS: Labs reviewed: Acceptable for surgery. (all labs ordered are listed, but only abnormal results are displayed)  Labs Reviewed  BASIC METABOLIC PANEL - Abnormal; Notable for the following components:      Result Value   Calcium 8.8 (*)    Anion gap 4 (*)    All other components within normal limits  CBC  HEMOGLOBIN A1C  GLUCOSE, CAPILLARY     IMAGES:   EKG:   CV: Echo 09/08/2021 1. Left ventricular ejection fraction, by estimation, is 55 to 60%. The  left ventricle has normal function. The left ventricle has no regional  wall motion  abnormalities. Left ventricular diastolic parameters are  indeterminate.   2. Right ventricular systolic function is normal. The right ventricular  size is normal.   3. Left atrial size was severely dilated.   4. The mitral valve is abnormal. Mild mitral valve regurgitation. No  evidence of mitral stenosis.   5. The aortic valve is normal in structure. Aortic valve regurgitation is  not visualized. No aortic stenosis is present.   6. The inferior vena cava is normal in size with greater than 50%  respiratory variability, suggesting right atrial pressure of 3 mmHg.  Past Medical History:  Diagnosis Date   Arthritis    Atrial fibrillation (HCC)    a. s/p multiple DCCV's ---> now rate-controlled. On Eliquis for anticoagulation   Cardiomyopathy (HCC)    a. EF previously at 25-30% (Tachycardia-mediated?) --> at 55-60% by echo in 03/2016   Cellulitis and abscess of right leg    Diabetes mellitus without complication (HCC)    Dysrhythmia    ED (erectile dysfunction)    Edema of knee    Hypertension    Hypogonadism male    Joint pain    Leg cramp    Muscle pain    Obesity    Obesity    OSA (obstructive sleep apnea)    a. on CPAP   Other fatigue    Pre-diabetes    SOB (shortness of breath)  SOB (shortness of breath) on exertion    Vitamin B12 deficiency    Vitamin D deficiency     Past Surgical History:  Procedure Laterality Date   APPENDECTOMY  1981   CARDIOVERSION N/A 03/24/2013   Procedure: CARDIOVERSION;  Surgeon: Lewayne Bunting, MD;  Location: [facility] ENDOSCOPY;  Service: Cardiovascular;  Laterality: N/A;   EYE SURGERY  in 1st grade   Left eye   LAPAROSCOPIC GASTRIC SLEEVE RESECTION  8/13    MEDICATIONS:  acetaminophen (TYLENOL) 500 MG tablet   apixaban (ELIQUIS) 5 MG TABS tablet   cholecalciferol (VITAMIN D3) 25 MCG (1000 UNIT) tablet   diltiazem (CARDIZEM CD) 120 MG 24 hr capsule   docusate sodium (COLACE) 100 MG capsule   doxycycline (VIBRA-TABS) 100 MG tablet    furosemide (LASIX) 20 MG tablet   metoprolol (TOPROL-XL) 200 MG 24 hr tablet   omeprazole (PRILOSEC OTC) 20 MG tablet   triamcinolone cream (KENALOG) 0.1 %   vitamin B-12 (CYANOCOBALAMIN) 1000 MCG tablet   No current facility-administered medications for this encounter.    Jodell Cipro Ward, PA-C [facility]-Surgical Testing [phone]

## 2021-12-07 NOTE — H&P (Signed)
?CC: here for surgery ? ?Requesting provider: Dr Alain Marion ? ?HPI: ?Dakota Hernandez is an 56 y.o. male who is here for robotic repair of right inguinal hernia with mesh.  ?He denies any medical changes since seen in clinic. Still not on eliquis due to insurance and cost issues.  ? ?Old hpi: ?Dakota Hernandez is a 56 y.o. male who is seen today as an office consultation at the request of Dr. Alain Marion for evaluation of Hernia ?Dakota Hernandez  ?He states that he has noticed something abnormal in his right scrotum for the past 2 to 3 months. At times he states that it feels weird. He states that sensation comes and goes. He denies any burning, shooting or stabbing pain. It is more of a dull ache. He has a remote history of gastric sleeve surgery at [facility] more than 10 to 12 years ago. He has struggled with weight regain over the past few years however he has been working with the obesity medicine program with [facility] and has lost 80 pounds since last April through lifestyle changes. He does have a diagnosis of permanent A. fib and is supposed to be on Eliquis. He states that he had significant issues with his last insurance company and paying for the medication and they would not except the $10 co-pay so he has not been able to afford it or take it. His cardiologist is Dr. Stanford Breed. He denies any TIAs or amaurosis fugax. He does have sleep apnea and uses CPAP. Prior abdominal surgery includes appendectomy seventh grade as well as a sleeve gastrectomy. He does have reflux and takes omeprazole for it.  ? ?Past Medical History:  ?Diagnosis Date  ? Arthritis   ? Atrial fibrillation ([place])   ? a. s/p multiple DCCV's ---> now rate-controlled. On Eliquis for anticoagulation  ? Cardiomyopathy ([place])   ? a. EF previously at 25-30% (Tachycardia-mediated?) --> at 55-60% by echo in 03/2016  ? Cellulitis and abscess of right leg   ? Diabetes mellitus without complication ([place])   ? Dysrhythmia   ? ED (erectile dysfunction)   ? Edema of  knee   ? Hypertension   ? Hypogonadism male   ? Joint pain   ? Leg cramp   ? Muscle pain   ? Obesity   ? Obesity   ? OSA (obstructive sleep apnea)   ? a. on CPAP  ? Other fatigue   ? Pre-diabetes   ? SOB (shortness of breath)   ? SOB (shortness of breath) on exertion   ? Vitamin B12 deficiency   ? Vitamin D deficiency   ? ? ?Past Surgical History:  ?Procedure Laterality Date  ? APPENDECTOMY  1981  ? CARDIOVERSION N/A 03/24/2013  ? Procedure: CARDIOVERSION;  Surgeon: Lelon Perla, MD;  Location: [facility] ENDOSCOPY;  Service: Cardiovascular;  Laterality: N/A;  ? EYE SURGERY  in 1st grade  ? Left eye  ? [place] RESECTION  8/13  ? ? ?Family History  ?Problem Relation Age of Onset  ? Heart attack Father 69  ? Heart disease Father 12  ? Hypertension Father   ? Heart failure Father   ? Sudden death Father   ? Heart attack Other   ?     cousin  ? Heart disease Other   ? Stroke Mother   ? Obesity Mother   ? ? ?Social:  reports that he has never smoked. He has never used smokeless tobacco. He reports that he does not drink  alcohol and does not use drugs. ? ?Allergies:  ?Allergies  ?Allergen Reactions  ? Hydrochlorothiazide Other (See Comments)  ?  Reaction not recalled by the patient  ? ? ?Medications: I have reviewed the patient's current medications. ? ? ?ROS - all of the below systems have been reviewed with the patient and positives are indicated with bold text ?General: chills, fever or night sweats ?Eyes: blurry vision or double vision ?ENT: epistaxis or sore throat ?Allergy/Immunology: itchy/watery eyes or nasal congestion ?Hematologic/Lymphatic: bleeding problems, blood clots or swollen lymph nodes ?Endocrine: temperature intolerance or unexpected weight changes ?Breast: new or changing breast lumps or nipple discharge ?Resp: cough, shortness of breath, or wheezing ?CV: chest pain or dyspnea on exertion ?GI: as per HPI ?GU: dysuria, trouble voiding, or hematuria ?MSK: joint pain or joint  stiffness ?Neuro: TIA or stroke symptoms ?Derm: pruritus and skin lesion changes ?Psych: anxiety and depression ? ?PE ?There were no vitals taken for this visit. ?Constitutional: NAD; conversant; no deformities ?Eyes: Moist conjunctiva; no lid lag; anicteric; PERRL ?Neck: Trachea midline; no thyromegaly ?Lungs: Normal respiratory effort; no tactile fremitus ?CV: RRR; no palpable thrills; no pitting edema ?GI: Abd soft, obese, redundant skin in pubic area; bulge Rt groin; no palpable hepatosplenomegaly ?MSK: Normal gait; no clubbing/cyanosis ?Psychiatric: Appropriate affect; alert and oriented x3 ?Lymphatic: No palpable cervical or axillary lymphadenopathy ?Skin: chronic skin changes to b/l LE - no active wounds ? ?No results found for this or any previous visit (from the past 48 hour(s)). ? ?No results found. ? ?Imaging: ?N/a ? ?A/P: ?Dakota Hernandez is an 56 y.o. male with  ?Right inguinal hernia ? ?Permanent atrial fibrillation with RVR (CMS-HCC) ? ?OSA (obstructive sleep apnea) ? ?Vitamin D deficiency disease ? ?Severe obesity (CMS-HCC) ? ?S/P laparoscopic sleeve gastrectomy ? ?Found to be an acceptable risk for surgery from a cardiovascular standpoint ? ?To OR for robotic repair of rih with mesh ?Decided on MIS approach given the excessive amount of skin/soft tissue in pubic area ?ERAS protocol ?All questions asked and answered ? ?Leighton Ruff. Redmond Pulling, MD, FACS ?General, Bariatric, & Minimally Invasive Surgery ?[facility] ? ? ?

## 2021-12-08 ENCOUNTER — Ambulatory Visit: Payer: BC Managed Care – PPO | Admitting: Internal Medicine

## 2021-12-08 ENCOUNTER — Other Ambulatory Visit: Payer: Self-pay

## 2021-12-08 ENCOUNTER — Encounter (HOSPITAL_COMMUNITY)
Admission: RE | Disposition: A | Discharge status: Home / Self Care | Payer: Self-pay | Source: Home / Self Care | Attending: General Surgery

## 2021-12-08 ENCOUNTER — Ambulatory Visit (HOSPITAL_COMMUNITY)
Admission: RE | Admit: 2021-12-08 | Discharge: 2021-12-08 | Disposition: A | Discharge status: Home / Self Care | Payer: BC Managed Care – PPO | Attending: General Surgery | Admitting: General Surgery

## 2021-12-08 ENCOUNTER — Encounter (HOSPITAL_COMMUNITY): Payer: Self-pay | Admitting: General Surgery

## 2021-12-08 ENCOUNTER — Ambulatory Visit (HOSPITAL_COMMUNITY): Payer: BC Managed Care – PPO | Admitting: Anesthesiology

## 2021-12-08 ENCOUNTER — Ambulatory Visit (HOSPITAL_COMMUNITY): Payer: BC Managed Care – PPO | Admitting: Physician Assistant

## 2021-12-08 DIAGNOSIS — K219 Gastro-esophageal reflux disease without esophagitis: Secondary | ICD-10-CM | POA: Insufficient documentation

## 2021-12-08 DIAGNOSIS — I10 Essential (primary) hypertension: Secondary | ICD-10-CM | POA: Insufficient documentation

## 2021-12-08 DIAGNOSIS — Z6841 Body Mass Index (BMI) 40.0 and over, adult: Secondary | ICD-10-CM | POA: Diagnosis not present

## 2021-12-08 DIAGNOSIS — I4821 Permanent atrial fibrillation: Secondary | ICD-10-CM | POA: Diagnosis not present

## 2021-12-08 DIAGNOSIS — Z9884 Bariatric surgery status: Secondary | ICD-10-CM | POA: Diagnosis not present

## 2021-12-08 DIAGNOSIS — G4733 Obstructive sleep apnea (adult) (pediatric): Secondary | ICD-10-CM | POA: Insufficient documentation

## 2021-12-08 DIAGNOSIS — E119 Type 2 diabetes mellitus without complications: Secondary | ICD-10-CM | POA: Insufficient documentation

## 2021-12-08 DIAGNOSIS — K4030 Unilateral inguinal hernia, with obstruction, without gangrene, not specified as recurrent: Secondary | ICD-10-CM | POA: Insufficient documentation

## 2021-12-08 DIAGNOSIS — G4730 Sleep apnea, unspecified: Secondary | ICD-10-CM | POA: Diagnosis not present

## 2021-12-08 DIAGNOSIS — E559 Vitamin D deficiency, unspecified: Secondary | ICD-10-CM | POA: Insufficient documentation

## 2021-12-08 HISTORY — PX: XI ROBOTIC ASSISTED INGUINAL HERNIA REPAIR WITH MESH: SHX6706

## 2021-12-08 LAB — GLUCOSE, CAPILLARY: Glucose-Capillary: 128 mg/dL — ABNORMAL HIGH (ref 70–99)

## 2021-12-08 SURGERY — REPAIR, HERNIA, INGUINAL, ROBOT-ASSISTED, LAPAROSCOPIC, USING MESH
Anesthesia: General | Site: Groin | Laterality: Right

## 2021-12-08 MED ORDER — ROCURONIUM BROMIDE 10 MG/ML (PF) SYRINGE
PREFILLED_SYRINGE | INTRAVENOUS | Status: DC | PRN
  Administered 2021-12-08: 50 mg via INTRAVENOUS
  Administered 2021-12-08: 10 mg via INTRAVENOUS
  Administered 2021-12-08 (×2): 20 mg via INTRAVENOUS

## 2021-12-08 MED ORDER — PROPOFOL 10 MG/ML IV BOLUS
INTRAVENOUS | Status: AC
  Filled 2021-12-08: qty 20

## 2021-12-08 MED ORDER — MIDAZOLAM HCL 2 MG/2ML IJ SOLN
INTRAMUSCULAR | Status: AC
  Filled 2021-12-08: qty 2

## 2021-12-08 MED ORDER — BUPIVACAINE-EPINEPHRINE (PF) 0.25% -1:200000 IJ SOLN
INTRAMUSCULAR | Status: DC | PRN
  Administered 2021-12-08: 30 mL

## 2021-12-08 MED ORDER — CHLORHEXIDINE GLUCONATE CLOTH 2 % EX PADS: 6.0000 | MEDICATED_PAD | Freq: Once | CUTANEOUS | Status: DC

## 2021-12-08 MED ORDER — PHENYLEPHRINE 40 MCG/ML (10ML) SYRINGE FOR IV PUSH (FOR BLOOD PRESSURE SUPPORT)
PREFILLED_SYRINGE | INTRAVENOUS | Status: AC
  Filled 2021-12-08: qty 20

## 2021-12-08 MED ORDER — CEFAZOLIN IN SODIUM CHLORIDE 3-0.9 GM/100ML-% IV SOLN
3.0000 g | INTRAVENOUS | Status: AC
  Administered 2021-12-08: 3 g via INTRAVENOUS

## 2021-12-08 MED ORDER — LIDOCAINE HCL 2 % IJ SOLN
INTRAMUSCULAR | Status: AC
  Filled 2021-12-08: qty 20

## 2021-12-08 MED ORDER — BUPIVACAINE LIPOSOME 1.3 % IJ SUSP
INTRAMUSCULAR | Status: AC
  Filled 2021-12-08: qty 20

## 2021-12-08 MED ORDER — DEXAMETHASONE SODIUM PHOSPHATE 10 MG/ML IJ SOLN
INTRAMUSCULAR | Status: AC
  Filled 2021-12-08: qty 1

## 2021-12-08 MED ORDER — HYDROMORPHONE HCL 1 MG/ML IJ SOLN: 0.2500 mg | INTRAMUSCULAR | Status: DC | PRN

## 2021-12-08 MED ORDER — LACTATED RINGERS IV SOLN: INTRAVENOUS | Status: DC

## 2021-12-08 MED ORDER — PROPOFOL 10 MG/ML IV BOLUS
INTRAVENOUS | Status: DC | PRN
  Administered 2021-12-08: 200 mg via INTRAVENOUS

## 2021-12-08 MED ORDER — OXYCODONE HCL 5 MG PO TABS: 5.0000 mg | ORAL_TABLET | Freq: Four times a day (QID) | ORAL | 0 refills | Status: DC | PRN

## 2021-12-08 MED ORDER — BUPIVACAINE-EPINEPHRINE (PF) 0.25% -1:200000 IJ SOLN
INTRAMUSCULAR | Status: AC
  Filled 2021-12-08: qty 30

## 2021-12-08 MED ORDER — KETOROLAC TROMETHAMINE 30 MG/ML IJ SOLN
INTRAMUSCULAR | Status: DC | PRN
  Administered 2021-12-08: 30 mg via INTRAVENOUS

## 2021-12-08 MED ORDER — FENTANYL CITRATE (PF) 250 MCG/5ML IJ SOLN
INTRAMUSCULAR | Status: DC | PRN
  Administered 2021-12-08: 100 ug via INTRAVENOUS
  Administered 2021-12-08 (×3): 50 ug via INTRAVENOUS

## 2021-12-08 MED ORDER — BUPIVACAINE LIPOSOME 1.3 % IJ SUSP
INTRAMUSCULAR | Status: DC | PRN
  Administered 2021-12-08: 20 mL

## 2021-12-08 MED ORDER — ROCURONIUM BROMIDE 10 MG/ML (PF) SYRINGE
PREFILLED_SYRINGE | INTRAVENOUS | Status: AC
  Filled 2021-12-08: qty 10

## 2021-12-08 MED ORDER — SUCCINYLCHOLINE CHLORIDE 200 MG/10ML IV SOSY
PREFILLED_SYRINGE | INTRAVENOUS | Status: DC | PRN
Start: 2021-12-08 — End: 2021-12-08
  Administered 2021-12-08: 160 mg via INTRAVENOUS

## 2021-12-08 MED ORDER — ONDANSETRON HCL 4 MG/2ML IJ SOLN
INTRAMUSCULAR | Status: DC | PRN
  Administered 2021-12-08: 4 mg via INTRAVENOUS

## 2021-12-08 MED ORDER — FENTANYL CITRATE (PF) 250 MCG/5ML IJ SOLN
INTRAMUSCULAR | Status: AC
  Filled 2021-12-08: qty 5

## 2021-12-08 MED ORDER — LIDOCAINE HCL (PF) 2 % IJ SOLN
INTRAMUSCULAR | Status: AC
  Filled 2021-12-08: qty 5

## 2021-12-08 MED ORDER — MIDAZOLAM HCL 2 MG/2ML IJ SOLN
INTRAMUSCULAR | Status: DC | PRN
Start: 2021-12-08 — End: 2021-12-08
  Administered 2021-12-08: 2 mg via INTRAVENOUS

## 2021-12-08 MED ORDER — CEFAZOLIN SODIUM-DEXTROSE 2-4 GM/100ML-% IV SOLN
2.0000 g | INTRAVENOUS | Status: DC
  Filled 2021-12-08: qty 100

## 2021-12-08 MED ORDER — CEFAZOLIN IN SODIUM CHLORIDE 3-0.9 GM/100ML-% IV SOLN
INTRAVENOUS | Status: AC
  Filled 2021-12-08: qty 100

## 2021-12-08 MED ORDER — BUPIVACAINE LIPOSOME 1.3 % IJ SUSP: 20.0000 mL | Freq: Once | INTRAMUSCULAR | Status: DC

## 2021-12-08 MED ORDER — LIDOCAINE 2% (20 MG/ML) 5 ML SYRINGE
INTRAMUSCULAR | Status: DC | PRN
  Administered 2021-12-08: 1.5 mg/kg/h via INTRAVENOUS

## 2021-12-08 MED ORDER — DEXAMETHASONE SODIUM PHOSPHATE 10 MG/ML IJ SOLN
INTRAMUSCULAR | Status: DC | PRN
  Administered 2021-12-08: 10 mg via INTRAVENOUS

## 2021-12-08 MED ORDER — SUGAMMADEX SODIUM 500 MG/5ML IV SOLN
INTRAVENOUS | Status: AC
  Filled 2021-12-08: qty 5

## 2021-12-08 MED ORDER — KETOROLAC TROMETHAMINE 30 MG/ML IJ SOLN
INTRAMUSCULAR | Status: AC
  Filled 2021-12-08: qty 1

## 2021-12-08 MED ORDER — 0.9 % SODIUM CHLORIDE (POUR BTL) OPTIME
TOPICAL | Status: DC | PRN
  Administered 2021-12-08: 1000 mL

## 2021-12-08 MED ORDER — SUCCINYLCHOLINE CHLORIDE 200 MG/10ML IV SOSY
PREFILLED_SYRINGE | INTRAVENOUS | Status: AC
  Filled 2021-12-08: qty 10

## 2021-12-08 MED ORDER — ORAL CARE MOUTH RINSE: 15.0000 mL | Freq: Once | OROMUCOSAL | Status: AC

## 2021-12-08 MED ORDER — LACTATED RINGERS IV SOLN: INTRAVENOUS | Status: DC | PRN

## 2021-12-08 MED ORDER — ONDANSETRON HCL 4 MG/2ML IJ SOLN
INTRAMUSCULAR | Status: AC
  Filled 2021-12-08: qty 2

## 2021-12-08 MED ORDER — ACETAMINOPHEN 500 MG PO TABS
1000.0000 mg | ORAL_TABLET | ORAL | Status: AC
  Administered 2021-12-08: 1000 mg via ORAL
  Filled 2021-12-08: qty 2

## 2021-12-08 MED ORDER — ACETAMINOPHEN 500 MG PO TABS: 1000.0000 mg | ORAL_TABLET | Freq: Four times a day (QID) | ORAL | 0 refills | Status: AC

## 2021-12-08 MED ORDER — LIDOCAINE 2% (20 MG/ML) 5 ML SYRINGE
INTRAMUSCULAR | Status: DC | PRN
  Administered 2021-12-08: 100 mg via INTRAVENOUS

## 2021-12-08 MED ORDER — PHENYLEPHRINE 40 MCG/ML (10ML) SYRINGE FOR IV PUSH (FOR BLOOD PRESSURE SUPPORT)
PREFILLED_SYRINGE | INTRAVENOUS | Status: DC | PRN
  Administered 2021-12-08 (×2): 120 ug via INTRAVENOUS

## 2021-12-08 MED ORDER — CHLORHEXIDINE GLUCONATE 0.12 % MT SOLN
15.0000 mL | Freq: Once | OROMUCOSAL | Status: AC
  Administered 2021-12-08: 15 mL via OROMUCOSAL

## 2021-12-08 MED ORDER — SUGAMMADEX SODIUM 500 MG/5ML IV SOLN
INTRAVENOUS | Status: DC | PRN
  Administered 2021-12-08: 300 mg via INTRAVENOUS

## 2021-12-08 MED ORDER — HYDROMORPHONE HCL 1 MG/ML IJ SOLN
INTRAMUSCULAR | Status: AC
  Filled 2021-12-08: qty 2

## 2021-12-08 SURGICAL SUPPLY — 66 items
ADH SKN CLS APL DERMABOND .7 (GAUZE/BANDAGES/DRESSINGS) ×1
APL PRP STRL LF DISP 70% ISPRP (MISCELLANEOUS) ×1
BAG COUNTER SPONGE SURGICOUNT (BAG) IMPLANT
BAG SPNG CNTER NS LX DISP (BAG)
BLADE SURG SZ11 CARB STEEL (BLADE) ×2 IMPLANT
CANNULA REDUC XI 12-8 STAPL (CANNULA)
CANNULA REDUCER 12-8 DVNC XI (CANNULA) IMPLANT
CHLORAPREP W/TINT 26 (MISCELLANEOUS) ×2 IMPLANT
COVER MAYO STAND STRL (DRAPES) ×1 IMPLANT
COVER SURGICAL LIGHT HANDLE (MISCELLANEOUS) ×2 IMPLANT
COVER TIP SHEARS 8 DVNC (MISCELLANEOUS) ×1 IMPLANT
COVER TIP SHEARS 8MM DA VINCI (MISCELLANEOUS) ×2
DERMABOND ADVANCED (GAUZE/BANDAGES/DRESSINGS) ×1
DERMABOND ADVANCED .7 DNX12 (GAUZE/BANDAGES/DRESSINGS) IMPLANT
DRAPE ARM DVNC X/XI (DISPOSABLE) ×3 IMPLANT
DRAPE COLUMN DVNC XI (DISPOSABLE) ×1 IMPLANT
DRAPE DA VINCI XI ARM (DISPOSABLE) ×8
DRAPE DA VINCI XI COLUMN (DISPOSABLE) ×2
DRSG TEGADERM 2-3/8X2-3/4 SM (GAUZE/BANDAGES/DRESSINGS) ×6 IMPLANT
ELECT REM PT RETURN 15FT ADLT (MISCELLANEOUS) ×2 IMPLANT
GAUZE SPONGE 2X2 8PLY STRL LF (GAUZE/BANDAGES/DRESSINGS) IMPLANT
GLOVE SRG 8 PF TXTR STRL LF DI (GLOVE) ×1 IMPLANT
GLOVE SURG POLY ORTHO LF SZ7.5 (GLOVE) ×4 IMPLANT
GLOVE SURG UNDER POLY LF SZ8 (GLOVE) ×2
GOWN STRL REUS W/ TWL XL LVL3 (GOWN DISPOSABLE) IMPLANT
GOWN STRL REUS W/TWL XL LVL3 (GOWN DISPOSABLE) ×5 IMPLANT
GRASPER SUT TROCAR 14GX15 (MISCELLANEOUS) ×2 IMPLANT
IRRIG SUCT STRYKERFLOW 2 WTIP (MISCELLANEOUS)
IRRIGATION SUCT STRKRFLW 2 WTP (MISCELLANEOUS) IMPLANT
KIT BASIN OR (CUSTOM PROCEDURE TRAY) ×2 IMPLANT
KIT TURNOVER KIT A (KITS) IMPLANT
MARKER SKIN DUAL TIP RULER LAB (MISCELLANEOUS) ×2 IMPLANT
MAT PREVALON FULL STRYKER (MISCELLANEOUS) ×1 IMPLANT
MESH 3DMAX 4X6 RT LRG (Mesh General) ×1 IMPLANT
NEEDLE HYPO 22GX1.5 SAFETY (NEEDLE) ×2 IMPLANT
OBTURATOR OPTICAL STANDARD 8MM (TROCAR) ×2
OBTURATOR OPTICAL STND 8 DVNC (TROCAR) ×1
OBTURATOR OPTICALSTD 8 DVNC (TROCAR) ×1 IMPLANT
PACK CARDIOVASCULAR III (CUSTOM PROCEDURE TRAY) ×2 IMPLANT
PAD POSITIONING PINK XL (MISCELLANEOUS) ×1 IMPLANT
SCISSORS LAP 5X35 DISP (ENDOMECHANICALS) IMPLANT
SEAL CANN UNIV 5-8 DVNC XI (MISCELLANEOUS) ×2 IMPLANT
SEAL XI 5MM-8MM UNIVERSAL (MISCELLANEOUS) ×4
SOL ANTI FOG 6CC (MISCELLANEOUS) ×1 IMPLANT
SOLUTION ANTI FOG 6CC (MISCELLANEOUS) ×1
SOLUTION ELECTROLUBE (MISCELLANEOUS) ×2 IMPLANT
SPIKE FLUID TRANSFER (MISCELLANEOUS) ×2 IMPLANT
SPONGE GAUZE 2X2 STER 10/PKG (GAUZE/BANDAGES/DRESSINGS)
SPONGE T-LAP 18X18 ~~LOC~~+RFID (SPONGE) ×2 IMPLANT
STAPLER CANNULA SEAL DVNC XI (STAPLE) ×1 IMPLANT
STAPLER CANNULA SEAL XI (STAPLE) ×2
STRIP CLOSURE SKIN 1/2X4 (GAUZE/BANDAGES/DRESSINGS) IMPLANT
SUT MNCRL AB 4-0 PS2 18 (SUTURE) ×3 IMPLANT
SUT VIC AB 2-0 SH 27 (SUTURE)
SUT VIC AB 2-0 SH 27X BRD (SUTURE) IMPLANT
SUT VIC AB 3-0 SH 27 (SUTURE) ×4
SUT VIC AB 3-0 SH 27XBRD (SUTURE) ×2 IMPLANT
SUT VICRYL 0 TIES 12 18 (SUTURE) ×1 IMPLANT
SUT VICRYL 0 UR6 27IN ABS (SUTURE) ×2 IMPLANT
SUT VLOC 180 3-0 9IN GS21 (SUTURE) ×2 IMPLANT
SYR 20ML LL LF (SYRINGE) ×2 IMPLANT
TAPE STRIPS DRAPE STRL (GAUZE/BANDAGES/DRESSINGS) ×1 IMPLANT
TOWEL OR 17X26 10 PK STRL BLUE (TOWEL DISPOSABLE) ×2 IMPLANT
TOWEL OR NON WOVEN STRL DISP B (DISPOSABLE) ×2 IMPLANT
TROCAR BLADELESS OPT 5 100 (ENDOMECHANICALS) ×2 IMPLANT
TUBING INSUFFLATION 10FT LAP (TUBING) ×2 IMPLANT

## 2021-12-08 NOTE — Transfer of Care (Signed)
Immediate Anesthesia Transfer of Care Note ? ?Patient: [name] ? ?Procedure(s) Performed: XI ROBOTIC REPAIR OF RIGHT INCARCERATED INGUINAL HERNIA WITH MESH (Right: Groin) ? ?Patient Location: PACU ? ?Anesthesia Type:General ? ?Level of Consciousness: awake ? ?Airway & Oxygen Therapy: Patient Spontanous Breathing and Patient connected to face mask oxygen ? ?Post-op Assessment: Report given to RN and Post -op Vital signs reviewed and stable ? ?Post vital signs: Reviewed and stable ? ?Last Vitals:  ?Vitals Value Taken Time  ?BP 119/84 12/08/21 1446  ?Temp    ?Pulse 82 12/08/21 1448  ?Resp 21 12/08/21 1448  ?SpO2 100 % 12/08/21 1448  ?Vitals shown include unvalidated device data. ? ?Last Pain:  ?Vitals:  ? 12/08/21 1025  ?TempSrc:   ?PainSc: 0-No pain  ?   ? ?  ? ?Complications: No notable events documented. ?

## 2021-12-08 NOTE — Discharge Instructions (Addendum)
[facility]  If you do get eliquis - can resume on Thursday  LAPAROSCOPIC/ROBOTIC SURGERY: POST OP INSTRUCTIONS Always review your discharge instruction sheet given to you by the facility where your surgery was performed. IF YOU HAVE DISABILITY OR FAMILY LEAVE FORMS, YOU MUST BRING THEM TO THE OFFICE FOR PROCESSING.   DO NOT GIVE THEM TO YOUR DOCTOR.  PAIN CONTROL  First take acetaminophen (Tylenol) AND/or ibuprofen (Advil) to control your pain after surgery.  Follow directions on package.  Taking acetaminophen (Tylenol) and/or ibuprofen (Advil) regularly after surgery will help to control your pain and lower the amount of prescription pain medication you may need.  You should not take more than 3,000 mg (3 grams) of acetaminophen (Tylenol) in 24 hours.  You should not take ibuprofen (Advil), aleve, motrin, naprosyn or other NSAIDS if you have a history of stomach ulcers or chronic kidney disease.  A prescription for pain medication may be given to you upon discharge.  Take your pain medication as prescribed, if you still have uncontrolled pain after taking acetaminophen (Tylenol) or ibuprofen (Advil). Use ice packs to help control pain. If you need a refill on your pain medication, please contact your pharmacy.  They will contact our office to request authorization. Prescriptions will not be filled after 5pm or on week-ends.  HOME MEDICATIONS Take your usually prescribed medications unless otherwise directed.  DIET You should follow a light diet the first few days after arrival home.  Be sure to include lots of fluids daily. Avoid fatty, fried foods.   CONSTIPATION It is common to experience some constipation after surgery and if you are taking pain medication.  Increasing fluid intake and taking a stool softener (such as Colace) will usually help or prevent this problem from occurring.  A mild laxative (Milk of Magnesia or Miralax) should be taken according to package  instructions if there are no bowel movements after 48 hours.  WOUND/INCISION CARE Most patients will experience some swelling and bruising in the area of the incisions.  Ice packs will help.  Swelling and bruising can take several days to resolve.  Unless discharge instructions indicate otherwise, follow guidelines below  STERI-STRIPS - you may remove your outer bandages 48 hours after surgery, and you may shower at that time.  You have steri-strips (small skin tapes) in place directly over the incision.  These strips should be left on the skin for 7-10 days.   DERMABOND/SKIN GLUE - you may shower in 24 hours.  The glue will flake off over the next 2-3 weeks. Any sutures or staples will be removed at the office during your follow-up visit.  ACTIVITIES You may resume regular (light) daily activities beginning the next day--such as daily self-care, walking, climbing stairs--gradually increasing activities as tolerated.  You may have sexual intercourse when it is comfortable.  Refrain from any heavy lifting or straining until approved by your doctor. You may drive when you are no longer taking prescription pain medication, you can comfortably wear a seatbelt, and you can safely maneuver your car and apply brakes.  FOLLOW-UP You should see your doctor in the office for a follow-up appointment approximately 2-3 weeks after your surgery.  You should have been given your post-op/follow-up appointment when your surgery was scheduled.  If you did not receive a post-op/follow-up appointment, make sure that you call for this appointment within a day or two after you arrive home to insure a convenient appointment time.  OTHER INSTRUCTIONS SOME SWELLING  IN THE SCROTUM IS EXPECTED. DO NOT LIFT/PUSH/PULL ANYTHING GREATER THAN 15 LBS FOR 1 MONTH  WHEN TO CALL YOUR DOCTOR: Fever over 101.0 Inability to urinate Continued bleeding from incision. Increased pain, redness, or drainage from the incision. Increasing  abdominal pain  The clinic staff is available to answer your questions during regular business hours.  Please dont hesitate to call and ask to speak to one of the nurses for clinical concerns.  If you have a medical emergency, go to the nearest emergency room or call 911.  A surgeon from [facility] is always on call at the hospital. [address] [place], [place], [place]  [address] ? [address], [place], [name]   [address] [phone] ? [phone] ? FAX [phone] Web site: [email or web address]     Managing Your Pain After Surgery Without Opioids    Thank you for participating in our program to help patients manage their pain after surgery without opioids. This is part of our effort to provide you with the best care possible, without exposing you or your family to the risk that opioids pose.  What pain can I expect after surgery? You can expect to have some pain after surgery. This is normal. The pain is typically worse the day after surgery, and quickly begins to get better. Many studies have found that many patients are able to manage their pain after surgery with Over-the-Counter (OTC) medications such as Tylenol and Motrin. If you have a condition that does not allow you to take Tylenol or Motrin, notify your surgical team.  How will I manage my pain? The best strategy for controlling your pain after surgery is around the clock pain control with Tylenol (acetaminophen) and Motrin (ibuprofen or Advil). Alternating these medications with each other allows you to maximize your pain control. In addition to Tylenol and Motrin, you can use heating pads or ice packs on your incisions to help reduce your pain.  How will I alternate your regular strength over-the-counter pain medication? You will take a dose of pain medication every three hours. Start by taking 650 mg of Tylenol (2 pills of 325 mg) 3 hours later take 600 mg of Motrin (3 pills of 200  mg) 3 hours after taking the Motrin take 650 mg of Tylenol 3 hours after that take 600 mg of Motrin.   - 1 -  See example - if your first dose of Tylenol is at 12:00 PM   12:00 PM Tylenol 650 mg (2 pills of 325 mg)  3:00 PM Motrin 600 mg (3 pills of 200 mg)  6:00 PM Tylenol 650 mg (2 pills of 325 mg)  9:00 PM Motrin 600 mg (3 pills of 200 mg)  Continue alternating every 3 hours   We recommend that you follow this schedule around-the-clock for at least 3 days after surgery, or until you feel that it is no longer needed. Use the table on the last page of this handout to keep track of the medications you are taking. Important: Do not take more than 3000mg  of Tylenol or 1600mg  of Motrin in a 24-hour period. Do not take ibuprofen/Motrin if you have a history of bleeding stomach ulcers, severe kidney disease, &/or actively taking a blood thinner  What if I still have pain? If you have pain that is not controlled with the over-the-counter pain medications (Tylenol and Motrin or Advil) you might have what we call breakthrough pain. You will receive a prescription for a small  amount of an opioid pain medication such as Oxycodone, Tramadol, or Tylenol with Codeine. Use these opioid pills in the first 24 hours after surgery if you have breakthrough pain. Do not take more than 1 pill every 4-6 hours.  If you still have uncontrolled pain after using all opioid pills, don't hesitate to call our staff using the number provided. We will help make sure you are managing your pain in the best way possible, and if necessary, we can provide a prescription for additional pain medication.   Day 1    Time  Name of Medication Number of pills taken  Amount of Acetaminophen  Pain Level   Comments  AM PM       AM PM       AM PM       AM PM       AM PM       AM PM       AM PM       AM PM       Total Daily amount of Acetaminophen Do not take more than  3,000 mg per day      Day 2    Time  Name  of Medication Number of pills taken  Amount of Acetaminophen  Pain Level   Comments  AM PM       AM PM       AM PM       AM PM       AM PM       AM PM       AM PM       AM PM       Total Daily amount of Acetaminophen Do not take more than  3,000 mg per day      Day 3    Time  Name of Medication Number of pills taken  Amount of Acetaminophen  Pain Level   Comments  AM PM       AM PM       AM PM       AM PM          AM PM       AM PM       AM PM       AM PM       Total Daily amount of Acetaminophen Do not take more than  3,000 mg per day      Day 4    Time  Name of Medication Number of pills taken  Amount of Acetaminophen  Pain Level   Comments  AM PM       AM PM       AM PM       AM PM       AM PM       AM PM       AM PM       AM PM       Total Daily amount of Acetaminophen Do not take more than  3,000 mg per day      Day 5    Time  Name of Medication Number of pills taken  Amount of Acetaminophen  Pain Level   Comments  AM PM       AM PM       AM PM       AM PM       AM PM       AM PM  AM PM       AM PM       Total Daily amount of Acetaminophen Do not take more than  3,000 mg per day       Day 6    Time  Name of Medication Number of pills taken  Amount of Acetaminophen  Pain Level  Comments  AM PM       AM PM       AM PM       AM PM       AM PM       AM PM       AM PM       AM PM       Total Daily amount of Acetaminophen Do not take more than  3,000 mg per day      Day 7    Time  Name of Medication Number of pills taken  Amount of Acetaminophen  Pain Level   Comments  AM PM       AM PM       AM PM       AM PM       AM PM       AM PM       AM PM       AM PM       Total Daily amount of Acetaminophen Do not take more than  3,000 mg per day        For additional information about how and where to safely dispose of unused opioid medications - [employer]  Disclaimer: This  document contains information and/or instructional materials adapted from [facility] for the typical patient with your condition. It does not replace medical advice from your health care provider because your experience may differ from that of the typical patient. Talk to your health care provider if you have any questions about this document, your condition or your treatment plan. Adapted from [employer]

## 2021-12-08 NOTE — Anesthesia Procedure Notes (Signed)
Procedure Name: Intubation ?Date/Time: 12/08/2021 12:18 PM ?Performed by: Sharlette Dense, CRNA ?Pre-anesthesia Checklist: Patient identified, Emergency Drugs available, Suction available and Patient being monitored ?Patient Re-evaluated:Patient Re-evaluated prior to induction ?Oxygen Delivery Method: Circle system utilized ?Preoxygenation: Pre-oxygenation with 100% oxygen ?Induction Type: IV induction ?Ventilation: Mask ventilation without difficulty ?Laryngoscope Size: Sabra Heck and 3 ?Grade View: Grade I ?Tube type: Oral ?Tube size: 8.0 mm ?Number of attempts: 1 ?Airway Equipment and Method: Stylet and Oral airway ?Placement Confirmation: ETT inserted through vocal cords under direct vision, positive ETCO2 and breath sounds checked- equal and bilateral ?Secured at: 22 cm ?Tube secured with: Tape ?Dental Injury: Teeth and Oropharynx as per pre-operative assessment  ? ? ? ? ?

## 2021-12-08 NOTE — Op Note (Signed)
PREOPERATIVE DIAGNOSIS: right inguinal hernia.  ?  ?POSTOPERATIVE DIAGNOSIS: incarcerated   right direct inguinal hernia ?  ?PROCEDURE: Robotic/XI repair of incarcerated right direct inguinal hernia with  ?mesh (rTAPP). Laparoscopic bilateral TAP block ?  ?SURGEON: Leighton Ruff. Redmond Pulling, MD  ?  ?ASSISTANT SURGEON: None.  ?  ?ANESTHESIA: General plus local consisting of 0.25% Marcaine with epi.  ?  ?ESTIMATED BLOOD LOSS: Minimal.  ?  ?FINDINGS: The patient had a incarcerated right direct inguinal hernia.  It was repaired using Bard 3dmax large right mesh  ?  ?SPECIMEN: none ?  ?INDICATIONS FOR PROCEDURE: 56 yo presented for repair of a symptomatic inguinal hernia. ?The risks and benefits including but not limited to bleeding, infection, chronic inguinal pain, nerve entrapment, hernia recurrence, mesh complications, hematoma formation, urinary retention, injury to the testicles or the ovaries, numbness in the groin, blood clots, injury to the surrounding structures, and anesthesia risk was discussed with the patient.  We selected a minimally invasive approach because of the patient's pannus.  I thought that would be increased risk of a wound infection through an open incision.  The right side of the abdomen had been marked in the holding area with the patient confirming the operative side. ?  ?DESCRIPTION OF PROCEDURE: After obtaining verbal consent the patient was then taken back to the operating room, placed  ?supine on the operating room table. General endotracheal anesthesia was  ?established. The patient had emptied their bladder prior to going back to  ?the operating room. Sequential compression devices were placed. The  ?abdomen and groin were prepped and draped in the usual standard surgical  ?fashion with ChloraPrep. The patient received oral Tylenol preoperatively as well as IV  ?antibiotics prior to the incision. A surgical time-out was performed.  ? ?Optical entry was obtained by making a small skin incision  about 2 fingerbreadths below the left subcostal margin at Palmer's point.  Then using a 0 degree 5 mm laparoscope I advanced it through all layers of the abdominal wall through a 5 mm trocar and carefully entered the abdominal cavity.  Pneumoperitoneum was smoothly established up to a patient pressure of 15 mmHg.  The patient was placed in Trendelenburg position at 18 degrees.  This revealed a direct inguinal hernia on the right.  He had had a large amount of herniated preperitoneal fat going through the defect. ? ?An 8 mm robotic trocar was placed in the supraumbilical position and a 12 mm robotic trocars placed in the patient's right side of the abdominal wall.  Next a bilateral laparoscopic tap block was performed for postoperative pain relief.  The optical entry trocar was exchanged for a robotic 8 mm trocar.  I then placed a piece of right Bard large 3D max mesh into the groin followed by a 9 inch 3 OV lock suture & itwo 3-0 Vicryl's sutures ? ? ?We then deployed the robot for pelvic surgery from patient's left.  The robot was docked.  Robotic laparoscope was placed through the supraumbilical trocar and the anatomy was targeted.  The other arms were then connected to the trochars.  A pair of MCS scissors was placed through the right trocar and a fenestrated bipolar through the left trocar all under direct visualization.  I then scrubbed out and went to the robotic console. ?  ? I then made incision along the peritoneum on the right, starting 2 inches above the anterior superior iliac spine and caring it medial toward the median umbilical ligament in a  lazy S configuration using MCS scissors with electrocautery. The peritoneal flap was then gently dissected downward from the anterior abdominal wall taking care not to  ?injure the inferior epigastric vessels. The pubic bone was identified. The testicular vessels were identified.  As stated he had a defect medial to the inferior epigastric vessels.  It was  incarcerated with preperitoneal fat.  I ended up using the fenestrated bipolar as well as a tip up grasper to reduce the incarcerated preperitoneal fat from the direct stacked.  At times cautery was used for hemostasis.  I was able to reduce the entire incarcerated preperitoneal fat.  Tip up grasper was replaced with MCS scissors.  Continued the dissection inferior/below the pubic bone for about 2 cm.  A small plug of fat was reduced from the obturator canal along with a calcified appearing lymph node.   The testicular vessels had been identified and preserved. The vas deferens was identified and preserved.    I then went about creating a large pocket by lifting the peritoneum of the pelvic floor.  Unfortunately I did get a defect in the peritoneal flap as I was making a pocket for the mesh.  I took great care not to injure the iliac vessels.    ?  ?I then obtained the previously placed piece of Bard large 3D right max mesh for the right groin and placed it into the inguinal area.   half of it covered medial  ?to the inferior epigastric vessels and half of it lateral to the  ?inferior epigastric vessels. The defect was well  ?covered with the mesh.  I then secured the mesh to Cooper's ligament with an two interrupted 3-0 Vicryl suture.  I placed an additional suture superior medially along the edge of the mesh medial to the inferior gastric vessel.  I placed a 3 Vicryl suture laterally along the superior lateral edge of the mesh lateral to the inferior epigastric vessels.  I placed a total of seven 3-0 vicryl sutures in the mesh. I placed that many sutures given the fact that it was medial and his BMI greater than 40. I then closed the peritoneal flap with a running 3-0 Vicryl V-Loc.  The mesh was well covered.  There were 2 defects in the peritoneal flap that were closed with running 2-0 v loc sutures. The defects were closed and no barbs were exposed..  The mesh was flat.  It had not curled up. ? ?  ?The surgical  robot was undocked and moved away from the OR table.  I scrubbed back in.  We removed the 5 sutures from the abdomen under direct visualization .  The robotic 12 mm trocar was removed.  The fascial defect was closed with a single interrupted 0 Vicryl using the PMI suture passer with laparoscopic guidance.  Additional local was infiltrated.  The closure was viewed laparoscopically. There was no evidence of fascial defect. There was no air leak at this site. There was no  ?evidence of injury to surrounding structures. Pneumoperitoneum was released, and the remaining trocars were removed. All skin incisions  ?were closed with a 4-0 Monocryl in a subcuticular fashion followed by application of Dermabond. All needle, instrument, and sponge counts  ?were correct x2.   There are no immediate complications. The patient tolerated the procedure well. The patient was extubated and taken to the  ?recovery room in stable condition. ? ?Leighton Ruff. Redmond Pulling, MD, FACS ?General, Bariatric, & Minimally Invasive Surgery ?[facility] ? ? ?

## 2021-12-08 NOTE — Anesthesia Preprocedure Evaluation (Signed)
Anesthesia Evaluation  ?Patient identified by MRN, date of birth, ID band ?Patient awake ? ? ? ?Reviewed: ?Allergy & Precautions, NPO status , Patient's Chart, lab work & pertinent test results ? ?Airway ?Mallampati: II ? ?TM Distance: >3 FB ? ? ? ? Dental ?  ?Pulmonary ?shortness of breath, sleep apnea ,  ?  ?breath sounds clear to auscultation ? ? ? ? ? ? Cardiovascular ?hypertension, + dysrhythmias  ?Rhythm:Regular Rate:Normal ? ? ?  ?Neuro/Psych ?  ? GI/Hepatic ?Neg liver ROS, History noted ?Dr. Chilton Si ?  ?Endo/Other  ?diabetes ? Renal/GU ?negative Renal ROS  ? ?  ?Musculoskeletal ? ? Abdominal ?  ?Peds ? Hematology ?  ?Anesthesia Other Findings ? ? Reproductive/Obstetrics ? ?  ? ? ? ? ? ? ? ? ? ? ? ? ? ?  ?  ? ? ? ? ? ? ? ? ?Anesthesia Physical ?Anesthesia Plan ? ?ASA: 3 ? ?Anesthesia Plan: General  ? ?Post-op Pain Management:   ? ?Induction: Intravenous ? ?PONV Risk Score and Plan: 2 and Ondansetron, Dexamethasone and Midazolam ? ?Airway Management Planned: Oral ETT ? ?Additional Equipment:  ? ?Intra-op Plan:  ? ?Post-operative Plan: Possible Post-op intubation/ventilation ? ?Informed Consent: I have reviewed the patients History and Physical, chart, labs and discussed the procedure including the risks, benefits and alternatives for the proposed anesthesia with the patient or authorized representative who has indicated his/her understanding and acceptance.  ? ? ? ?Dental advisory given ? ?Plan Discussed with: CRNA and Anesthesiologist ? ?Anesthesia Plan Comments:   ? ? ? ? ? ? ?Anesthesia Quick Evaluation ? ?

## 2021-12-08 NOTE — Anesthesia Postprocedure Evaluation (Signed)
Anesthesia Post Note ? ?Patient: Dakota Hernandez ? ?Procedure(s) Performed: XI ROBOTIC REPAIR OF RIGHT INCARCERATED INGUINAL HERNIA WITH MESH (Right: Groin) ? ?  ? ?Patient location during evaluation: PACU ?Anesthesia Type: General ?Level of consciousness: awake ?Pain management: pain level controlled ?Vital Signs Assessment: post-procedure vital signs reviewed and stable ?Respiratory status: spontaneous breathing ?Cardiovascular status: stable ?Postop Assessment: no apparent nausea or vomiting ?Anesthetic complications: no ? ? ?No notable events documented. ? ?Last Vitals:  ?Vitals:  ? 12/08/21 1520 12/08/21 1530  ?BP:  102/75  ?Pulse: 68 (!) 57  ?Resp: 16 18  ?Temp:    ?SpO2: 99% 93%  ?  ?Last Pain:  ?Vitals:  ? 12/08/21 1530  ?TempSrc:   ?PainSc: 0-No pain  ? ? ?  ?  ?  ?  ?  ?  ? ?[name] ? ? ? ? ?

## 2021-12-09 ENCOUNTER — Other Ambulatory Visit: Payer: Self-pay

## 2021-12-09 ENCOUNTER — Encounter (HOSPITAL_COMMUNITY): Payer: Self-pay | Admitting: General Surgery

## 2021-12-09 LAB — GLUCOSE, CAPILLARY: Glucose-Capillary: 94 mg/dL (ref 70–99)

## 2021-12-11 ENCOUNTER — Ambulatory Visit (INDEPENDENT_AMBULATORY_CARE_PROVIDER_SITE_OTHER): Payer: BC Managed Care – PPO | Admitting: Family Medicine

## 2021-12-11 ENCOUNTER — Encounter (INDEPENDENT_AMBULATORY_CARE_PROVIDER_SITE_OTHER): Payer: Self-pay | Admitting: Family Medicine

## 2021-12-11 ENCOUNTER — Other Ambulatory Visit: Payer: Self-pay

## 2021-12-11 VITALS — BP 128/84 | HR 60 | Temp 97.6°F | Ht 69.0 in | Wt 304.0 lb

## 2021-12-11 DIAGNOSIS — E669 Obesity, unspecified: Secondary | ICD-10-CM | POA: Diagnosis not present

## 2021-12-11 DIAGNOSIS — Z6841 Body Mass Index (BMI) 40.0 and over, adult: Secondary | ICD-10-CM

## 2021-12-11 DIAGNOSIS — I4891 Unspecified atrial fibrillation: Secondary | ICD-10-CM | POA: Diagnosis not present

## 2021-12-11 DIAGNOSIS — R7401 Elevation of levels of liver transaminase levels: Secondary | ICD-10-CM | POA: Diagnosis not present

## 2021-12-14 NOTE — Progress Notes (Unsigned)
Chief Complaint:   OBESITY Dakota Hernandez is here to discuss his progress with his obesity treatment plan along with follow-up of his obesity related diagnoses. Dakota Hernandez is on the Category 4 Plan and states he is following his eating plan approximately 70-75% of the time. Dakota Hernandez states he is walking for 5-10 minutes 4-5 times per week.  Today's visit was #: 14 Starting weight: 382 lbs Starting date: 01/28/2021 Today's weight: 304 lbs Today's date: 12/11/2021 Total lbs lost to date: 57 Total lbs lost since last in-office visit: 0  Interim History: Dakota Hernandez recently underwent Laparoscopic hernia surgery 4 days ago. He got to go home the same day as the procedure. He has cut down on snacks but has gotten into some habits he recognizes are self-sabotaging. He wants to get more fresh fruit and vegetables in the house. He is out of work until at least March 22nd. He realizes his biggest obstacle will be cutting down on snacking and grazing.  Subjective:   1. Atrial fibrillation, unspecified type [facility]) Dakota Hernandez has had difficulty getting Eliquis due to pharmacy change from [facility].  2. Transaminitis Dakota Hernandez's last LFTs in 09/2021 slightly elevated than previously. No imaging available.   Assessment/Plan:   1. Atrial fibrillation, unspecified type [facility]) Dakota Hernandez is to follow up with his insurance prescription coverage to ensure he doesn't run out.  2. Transaminitis We will repeat labs in May 2023.  3. Obesity with current BMI of 44.9 Dakota Hernandez is currently in the action stage of change. As such, his goal is to continue with weight loss efforts. He has agreed to the Category 4 Plan.   Exercise goals: All adults should avoid inactivity. Some physical activity is better than none, and adults who participate in any amount of physical activity gain some health benefits.  Behavioral modification strategies: increasing lean protein intake, meal planning and cooking strategies, keeping  healthy foods in the home, and planning for success.  Dakota Hernandez has agreed to follow-up with our clinic in 4 to 5 weeks. He was informed of the importance of frequent follow-up visits to maximize his success with intensive lifestyle modifications for his multiple health conditions.   Objective:   Blood pressure 128/84, pulse 60, temperature 97.6 F (36.4 C), height 5\' 9"  (1.753 m), weight (!) 304 lb (137.9 kg), SpO2 98 %. Body mass index is 44.89 kg/m.  General: Cooperative, alert, well developed, in no acute distress. HEENT: Conjunctivae and lids unremarkable. Cardiovascular: Regular rhythm.  Lungs: Normal work of breathing. Neurologic: No focal deficits.   Lab Results  Component Value Date   CREATININE 1.03 11/19/2021   BUN 20 11/19/2021   NA 139 11/19/2021   K 4.2 11/19/2021   CL 108 11/19/2021   CO2 27 11/19/2021   Lab Results  Component Value Date   ALT 55 (H) 09/09/2021   AST 58 (H) 09/09/2021   ALKPHOS 95 09/09/2021   BILITOT 0.9 09/09/2021   Lab Results  Component Value Date   HGBA1C 5.0 11/19/2021   HGBA1C 5.6 06/25/2021   HGBA1C 6.2 (H) 01/28/2021   HGBA1C 6.0 10/30/2020   HGBA1C 5.7 (H) 04/15/2020   Lab Results  Component Value Date   INSULIN 8.4 06/25/2021   INSULIN 17.7 01/28/2021   Lab Results  Component Value Date   TSH 0.875 01/28/2021   Lab Results  Component Value Date   CHOL 167 06/25/2021   HDL 39 (L) 06/25/2021   LDLCALC 114 (H) 06/25/2021   TRIG 75 06/25/2021  CHOLHDL 4.0 CALC 06/14/2008   Lab Results  Component Value Date   VD25OH 55.6 06/25/2021   VD25OH 11.0 (L) 01/28/2021   VD25OH 14 (L) 04/29/2020   Lab Results  Component Value Date   WBC 4.9 11/19/2021   HGB 15.2 11/19/2021   HCT 45.3 11/19/2021   MCV 89.2 11/19/2021   PLT 174 11/19/2021   No results found for: IRON, TIBC, FERRITIN  Attestation Statements:   Reviewed by clinician on day of visit: allergies, medications, problem list, medical history, surgical  history, family history, social history, and previous encounter notes.   I, Trixie Dredge, am acting as transcriptionist for Coralie Common, MD.  I have reviewed the above documentation for accuracy and completeness, and I agree with the above. -  ***

## 2022-01-08 ENCOUNTER — Encounter (INDEPENDENT_AMBULATORY_CARE_PROVIDER_SITE_OTHER): Payer: Self-pay | Admitting: Family Medicine

## 2022-01-08 ENCOUNTER — Ambulatory Visit (INDEPENDENT_AMBULATORY_CARE_PROVIDER_SITE_OTHER): Payer: BC Managed Care – PPO | Admitting: Family Medicine

## 2022-01-08 VITALS — BP 129/92 | HR 80 | Temp 97.6°F | Ht 69.0 in | Wt 307.0 lb

## 2022-01-08 DIAGNOSIS — E669 Obesity, unspecified: Secondary | ICD-10-CM | POA: Diagnosis not present

## 2022-01-08 DIAGNOSIS — Z6841 Body Mass Index (BMI) 40.0 and over, adult: Secondary | ICD-10-CM

## 2022-01-08 DIAGNOSIS — R7303 Prediabetes: Secondary | ICD-10-CM

## 2022-01-08 DIAGNOSIS — I4891 Unspecified atrial fibrillation: Secondary | ICD-10-CM

## 2022-01-08 MED ORDER — APIXABAN 5 MG PO TABS: 5.0000 mg | ORAL_TABLET | Freq: Two times a day (BID) | ORAL | 6 refills | Status: DC

## 2022-01-12 NOTE — Progress Notes (Signed)
? ? ? ?Chief Complaint:  ? ?OBESITY ?Dakota Hernandez is here to discuss his progress with his obesity treatment plan along with follow-up of his obesity related diagnoses. Dakota Hernandez is on the Category 4 Plan and states he is following his eating plan approximately 65% of the time. Dakota Hernandez states he is doing [employer] and walking for 15 minutes 1-2 times per week. ? ?Today's visit was #: 15 ?Starting weight: 382 lbs ?Starting date: 01/28/2021 ?Today's weight: 307 lbs ?Today's date: 01/08/2022 ?Total lbs lost to date: 75 lbs ?Total lbs lost since last in-office visit: 0 ? ?Interim History: Dakota Hernandez has been back at work doing light duty and was released to full duty 3 days ago. Prior to that he was recognizing he was doing some boredom eating with grazing more frequent snacking. He is feeling better since hernia surgery. He wants to get back on plan. He realizes he was eating out more frequently due to lack of preparation.  ? ?Subjective:  ? ?1. Pre-diabetes ?Dakota Hernandez's last A1C was 5.0. He notes some increased cravings for carbohydrates over the last few weeks.  ? ?2. Atrial fibrillation, unspecified type (HCC) ?Dakota Hernandez is currently not taking Eliquis due to insurance prescription issues. He is taking diltiazem and metoprolol currently.  ? ?Assessment/Plan:  ? ?1. Pre-diabetes ?We will get labs July 2023. He will continue with Category 4 plan. Dakota Hernandez will continue to work on weight loss, exercise, and decreasing simple carbohydrates to help decrease the risk of diabetes.  ? ?2. Atrial fibrillation, unspecified type (HCC) ?We will refill Eliquis 5 mg by mouth twice daily for 1 with 6 refills.  ? ?- apixaban (ELIQUIS) 5 MG TABS tablet; Take 1 tablet (5 mg total) by mouth 2 (two) times daily.  Dispense: 60 tablet; Refill: 6 ? ?3. Obesity with current BMI of 45.4 ?Dakota Hernandez is currently in the action stage of change. As such, his goal is to continue with weight loss efforts. He has agreed to the Category 4 Plan.  ? ?Exercise goals:  As is.   ? ?Behavioral modification strategies: increasing lean protein intake, meal planning and cooking strategies, keeping healthy foods in the home, and planning for success. ? ?Dakota Hernandez has agreed to follow-up with our clinic in 5-6 weeks. He was informed of the importance of frequent follow-up visits to maximize his success with intensive lifestyle modifications for his multiple health conditions.  ? ?Objective:  ? ?Blood pressure (!) 129/92, pulse 80, temperature 97.6 ?F (36.4 ?C), height 5\' 9"  (1.753 m), weight (!) 307 lb (139.3 kg), SpO2 96 %. ?Body mass index is 45.34 kg/m?. ? ?General: Cooperative, alert, well developed, in no acute distress. ?HEENT: Conjunctivae and lids unremarkable. ?Cardiovascular: Regular rhythm.  ?Lungs: Normal work of breathing. ?Neurologic: No focal deficits.  ? ?Lab Results  ?Component Value Date  ? CREATININE 1.03 11/19/2021  ? BUN 20 11/19/2021  ? NA 139 11/19/2021  ? K 4.2 11/19/2021  ? CL 108 11/19/2021  ? CO2 27 11/19/2021  ? ?Lab Results  ?Component Value Date  ? ALT 55 (H) 09/09/2021  ? AST 58 (H) 09/09/2021  ? ALKPHOS 95 09/09/2021  ? BILITOT 0.9 09/09/2021  ? ?Lab Results  ?Component Value Date  ? HGBA1C 5.0 11/19/2021  ? HGBA1C 5.6 06/25/2021  ? HGBA1C 6.2 (H) 01/28/2021  ? HGBA1C 6.0 10/30/2020  ? HGBA1C 5.7 (H) 04/15/2020  ? ?Lab Results  ?Component Value Date  ? INSULIN 8.4 06/25/2021  ? INSULIN 17.7 01/28/2021  ? ?Lab Results  ?Component Value Date  ?  TSH 0.875 01/28/2021  ? ?Lab Results  ?Component Value Date  ? CHOL 167 06/25/2021  ? HDL 39 (L) 06/25/2021  ? LDLCALC 114 (H) 06/25/2021  ? TRIG 75 06/25/2021  ? CHOLHDL 4.0 CALC 06/14/2008  ? ?Lab Results  ?Component Value Date  ? VD25OH 55.6 06/25/2021  ? VD25OH 11.0 (L) 01/28/2021  ? VD25OH 14 (L) 04/29/2020  ? ?Lab Results  ?Component Value Date  ? WBC 4.9 11/19/2021  ? HGB 15.2 11/19/2021  ? HCT 45.3 11/19/2021  ? MCV 89.2 11/19/2021  ? PLT 174 11/19/2021  ? ?No results found for: IRON, TIBC, FERRITIN ? ?Attestation  Statements:  ? ?Reviewed by clinician on day of visit: allergies, medications, problem list, medical history, surgical history, family history, social history, and previous encounter notes. ? ?I, Jackson Latino, RMA, am acting as transcriptionist for Reuben Likes, MD.  ? ?I have reviewed the above documentation for accuracy and completeness, and I agree with the above. Reuben Likes, MD ? ?

## 2022-01-14 ENCOUNTER — Encounter (INDEPENDENT_AMBULATORY_CARE_PROVIDER_SITE_OTHER): Payer: Self-pay

## 2022-01-14 ENCOUNTER — Telehealth (INDEPENDENT_AMBULATORY_CARE_PROVIDER_SITE_OTHER): Payer: Self-pay | Admitting: Family Medicine

## 2022-01-14 NOTE — Telephone Encounter (Signed)
Prior authorization approved for Eliquis. Effective: 12/13/21 - 01/11/22. Patient sent approval message via mychart.  ?

## 2022-02-12 ENCOUNTER — Encounter (INDEPENDENT_AMBULATORY_CARE_PROVIDER_SITE_OTHER): Payer: Self-pay | Admitting: Family Medicine

## 2022-02-12 ENCOUNTER — Telehealth (INDEPENDENT_AMBULATORY_CARE_PROVIDER_SITE_OTHER): Payer: BC Managed Care – PPO | Admitting: Family Medicine

## 2022-02-12 DIAGNOSIS — I4891 Unspecified atrial fibrillation: Secondary | ICD-10-CM

## 2022-02-12 DIAGNOSIS — Z6841 Body Mass Index (BMI) 40.0 and over, adult: Secondary | ICD-10-CM

## 2022-02-12 DIAGNOSIS — E669 Obesity, unspecified: Secondary | ICD-10-CM

## 2022-02-12 DIAGNOSIS — E66813 Obesity, class 3: Secondary | ICD-10-CM

## 2022-02-12 DIAGNOSIS — I4821 Permanent atrial fibrillation: Secondary | ICD-10-CM

## 2022-02-19 NOTE — Progress Notes (Signed)
TeleHealth Visit:  Due to the COVID-19 pandemic, this visit was completed with telemedicine (audio/video) technology to reduce patient and provider exposure as well as to preserve personal protective equipment.   Dakota Hernandez has verbally consented to this TeleHealth visit. The patient is located at home, the provider is located at home. The participants in this visit include the listed provider and patient. The visit was conducted today via Mychart video.  Chief Complaint: OBESITY Chanc is here to discuss his progress with his obesity treatment plan along with follow-up of his obesity related diagnoses. Dakota Hernandez is on the Category 1 Plan and states he is following his eating plan approximately 50-60% of the time. Dakota Hernandez states he is working 6 days per week.  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 Dinner is 50/50 and breakfast is hit or miss. Lunch is consistent on plan. Next 3 weeks will be this busy. Should have some severance pay.  Subjective:   1. Atrial fibrillation, unspecified type (HCC) Dakota Hernandez is currently taking Diltiazem, Eliquis and Metoprolol. Unfortunately he has not been consistently on Eliquis.  Assessment/Plan:   1. Atrial fibrillation, unspecified type [facility]) [place] will follow up on medications at next appointment as he is going to be out of work.  2. Obesity with current BMI of 45.4 Dakota Hernandez is currently in the action stage of change. As such, his goal is to continue with weight loss efforts. He has agreed to the Category 4 Plan.   Exercise goals:  patient is currently doing physical labor at work  Behavioral modification strategies: increasing lean protein intake, meal planning and cooking strategies, keeping healthy foods in the home, and ways to avoid boredom eating.  Dakota Hernandez has agreed to follow-up with  our clinic in 4 weeks. He was informed of the importance of frequent follow-up visits to maximize his success with intensive lifestyle modifications for his multiple health conditions.  Objective:   VITALS: Per patient if applicable, see vitals. GENERAL: Alert and in no acute distress. CARDIOPULMONARY: No increased WOB. Speaking in clear sentences.  PSYCH: Pleasant and cooperative. Speech normal rate and rhythm. Affect is appropriate. Insight and judgement are appropriate. Attention is focused, linear, and appropriate.  NEURO: Oriented as arrived to appointment on time with no prompting.   Lab Results  Component Value Date   CREATININE 1.03 11/19/2021   BUN 20 11/19/2021   NA 139 11/19/2021   K 4.2 11/19/2021   CL 108 11/19/2021   CO2 27 11/19/2021   Lab Results  Component Value Date   ALT 55 (H) 09/09/2021   AST 58 (H) 09/09/2021   ALKPHOS 95 09/09/2021   BILITOT 0.9 09/09/2021   Lab Results  Component Value Date   HGBA1C 5.0 11/19/2021   HGBA1C 5.6 06/25/2021   HGBA1C 6.2 (H) 01/28/2021   HGBA1C 6.0 10/30/2020   HGBA1C 5.7 (H) 04/15/2020   Lab Results  Component Value Date   INSULIN 8.4 06/25/2021   INSULIN 17.7 01/28/2021   Lab Results  Component Value Date   TSH 0.875 01/28/2021   Lab Results  Component Value Date   CHOL 167 06/25/2021   HDL 39 (L) 06/25/2021   LDLCALC 114 (H) 06/25/2021   TRIG 75 06/25/2021   CHOLHDL 4.0 CALC 06/14/2008   Lab Results  Component Value Date   VD25OH 55.6 06/25/2021  VD25OH 11.0 (L) 01/28/2021   VD25OH 14 (L) 04/29/2020   Lab Results  Component Value Date   WBC 4.9 11/19/2021   HGB 15.2 11/19/2021   HCT 45.3 11/19/2021   MCV 89.2 11/19/2021   PLT 174 11/19/2021   No results found for: IRON, TIBC, FERRITIN  Attestation Statements:   Reviewed by clinician on day of visit: allergies, medications, problem list, medical history, surgical history, family history, social history, and previous encounter notes.  I,  Fortino Sic, RMA am acting as transcriptionist for Reuben Likes, MD.  I have reviewed the above documentation for accuracy and completeness, and I agree with the above. - Reuben Likes, MD

## 2022-03-11 ENCOUNTER — Encounter (INDEPENDENT_AMBULATORY_CARE_PROVIDER_SITE_OTHER): Payer: Self-pay | Admitting: Family Medicine

## 2022-03-11 ENCOUNTER — Ambulatory Visit (INDEPENDENT_AMBULATORY_CARE_PROVIDER_SITE_OTHER): Payer: BC Managed Care – PPO | Admitting: Family Medicine

## 2022-03-11 VITALS — BP 127/81 | HR 91 | Temp 97.1°F | Ht 69.0 in | Wt 296.0 lb

## 2022-03-11 DIAGNOSIS — R7303 Prediabetes: Secondary | ICD-10-CM

## 2022-03-11 DIAGNOSIS — E559 Vitamin D deficiency, unspecified: Secondary | ICD-10-CM | POA: Diagnosis not present

## 2022-03-11 DIAGNOSIS — Z6841 Body Mass Index (BMI) 40.0 and over, adult: Secondary | ICD-10-CM

## 2022-03-11 DIAGNOSIS — E782 Mixed hyperlipidemia: Secondary | ICD-10-CM

## 2022-03-11 DIAGNOSIS — E669 Obesity, unspecified: Secondary | ICD-10-CM

## 2022-03-11 DIAGNOSIS — K760 Fatty (change of) liver, not elsewhere classified: Secondary | ICD-10-CM | POA: Diagnosis not present

## 2022-03-12 LAB — COMPREHENSIVE METABOLIC PANEL
ALT: 17 IU/L (ref 0–44)
AST: 18 IU/L (ref 0–40)
Albumin/Globulin Ratio: 1.7 (ref 1.2–2.2)
Albumin: 4.3 g/dL (ref 3.8–4.9)
Alkaline Phosphatase: 89 IU/L (ref 44–121)
BUN/Creatinine Ratio: 14 (ref 9–20)
BUN: 16 mg/dL (ref 6–24)
Bilirubin Total: 0.8 mg/dL (ref 0.0–1.2)
CO2: 25 mmol/L (ref 20–29)
Calcium: 9.2 mg/dL (ref 8.7–10.2)
Chloride: 102 mmol/L (ref 96–106)
Creatinine, Ser: 1.11 mg/dL (ref 0.76–1.27)
Globulin, Total: 2.6 g/dL (ref 1.5–4.5)
Glucose: 81 mg/dL (ref 70–99)
Potassium: 4.4 mmol/L (ref 3.5–5.2)
Sodium: 142 mmol/L (ref 134–144)
Total Protein: 6.9 g/dL (ref 6.0–8.5)
eGFR: 79 mL/min/{1.73_m2} (ref >59)

## 2022-03-12 LAB — LIPID PANEL WITH LDL/HDL RATIO
Cholesterol, Total: 183 mg/dL (ref 100–199)
HDL: 57 mg/dL (ref >39)
LDL Chol Calc (NIH): 112 mg/dL — ABNORMAL HIGH (ref 0–99)
LDL/HDL Ratio: 2 ratio (ref 0.0–3.6)
Triglycerides: 73 mg/dL (ref 0–149)
VLDL Cholesterol Cal: 14 mg/dL (ref 5–40)

## 2022-03-12 LAB — HEMOGLOBIN A1C
Est. average glucose Bld gHb Est-mCnc: 108 mg/dL
Hgb A1c MFr Bld: 5.4 % (ref 4.8–5.6)

## 2022-03-12 LAB — INSULIN, RANDOM: INSULIN: 13.6 u[IU]/mL (ref 2.6–24.9)

## 2022-03-12 LAB — VITAMIN D 25 HYDROXY (VIT D DEFICIENCY, FRACTURES): Vit D, 25-Hydroxy: 65.3 ng/mL (ref 30.0–100.0)

## 2022-03-13 NOTE — Progress Notes (Unsigned)
Chief Complaint:   OBESITY Dakota Hernandez is here to discuss his progress with his obesity treatment plan along with follow-up of his obesity related diagnoses. Dakota Hernandez is on the Category 4 Plan and states he is following his eating plan approximately 40-50% of the time. Tracen states he is walking at work.  Today's visit was #: 73 Starting weight: 382 lbs Starting date: 01/28/2021 Today's weight: 296 lbs Today's date: 03/11/2022 Total lbs lost to date: 86 lbs Total lbs lost since last in-office visit: 11  Interim History: Dakota Hernandez's work closed to the public 2 weeks ago then last official day of work was last week. He has 8 wks of severance pay. Last week he was on plan but prior to that he really was not following plan due to work commitments. He wants to recommit to plan and eat breakfast at home.  Subjective:   1. Prediabetes Dakota Hernandez is not on medications. His last A1c at 5.o, insulin at 8.4.  2. Vitamin D deficiency Dakota Hernandez is currently taking over the counter Vit D 1K IU daily. He notes fatigue.  3. Mixed hyperlipidemia Dakota Hernandez is not taking any statin medication. His last LDL at 114, HDL 39, Trigly 75.  4. NAFLD (nonalcoholic fatty liver disease) Imaging from 2012 showing steatosis. His last LFT's AST 58, ALT 55.  Assessment/Plan:   1. Prediabetes We will obtain labs today.  - Hemoglobin A1c - Insulin, random  2. Vitamin D deficiency We will obtain labs today.  - VITAMIN D 25 Hydroxy (Vit-D Deficiency, Fractures)  3. Mixed hyperlipidemia We will obtain labs today.  - Lipid Panel With LDL/HDL Ratio  4. NAFLD (nonalcoholic fatty liver disease) We will obtain labs today.  - Comprehensive metabolic panel  5. Obesity with current BMI of 43.8 Culver is currently in the action stage of change. As such, his goal is to continue with weight loss efforts. He has agreed to the Category 4 Plan.   Exercise goals: All adults should avoid inactivity. Some physical activity is  better than none, and adults who participate in any amount of physical activity gain some health benefits.  Behavioral modification strategies: increasing lean protein intake, meal planning and cooking strategies, keeping healthy foods in the home, and planning for success.  Dakota Hernandez has agreed to follow-up with our clinic in 4 weeks. He was informed of the importance of frequent follow-up visits to maximize his success with intensive lifestyle modifications for his multiple health conditions.   Dakota Hernandez was informed we would discuss his lab results at his next visit unless there is a critical issue that needs to be addressed sooner. Dakota Hernandez agreed to keep his next visit at the agreed upon time to discuss these results.  Objective:   Blood pressure 127/81, pulse 91, temperature (!) 97.1 F (36.2 C), height 5\' 9"  (1.753 m), weight 296 lb (134.3 kg), SpO2 99 %. Body mass index is 43.71 kg/m.  General: Cooperative, alert, well developed, in no acute distress. HEENT: Conjunctivae and lids unremarkable. Cardiovascular: Regular rhythm.  Lungs: Normal work of breathing. Neurologic: No focal deficits.   Lab Results  Component Value Date   CREATININE 1.11 03/11/2022   BUN 16 03/11/2022   NA 142 03/11/2022   K 4.4 03/11/2022   CL 102 03/11/2022   CO2 25 03/11/2022   Lab Results  Component Value Date   ALT 17 03/11/2022   AST 18 03/11/2022   ALKPHOS 89 03/11/2022   BILITOT 0.8 03/11/2022   Lab Results  Component Value  Date   HGBA1C 5.4 03/11/2022   HGBA1C 5.0 11/19/2021   HGBA1C 5.6 06/25/2021   HGBA1C 6.2 (H) 01/28/2021   HGBA1C 6.0 10/30/2020   Lab Results  Component Value Date   INSULIN 13.6 03/11/2022   INSULIN 8.4 06/25/2021   INSULIN 17.7 01/28/2021   Lab Results  Component Value Date   TSH 0.875 01/28/2021   Lab Results  Component Value Date   CHOL 183 03/11/2022   HDL 57 03/11/2022   LDLCALC 112 (H) 03/11/2022   TRIG 73 03/11/2022   CHOLHDL 4.0 CALC 06/14/2008    Lab Results  Component Value Date   VD25OH 65.3 03/11/2022   VD25OH 55.6 06/25/2021   VD25OH 11.0 (L) 01/28/2021   Lab Results  Component Value Date   WBC 4.9 11/19/2021   HGB 15.2 11/19/2021   HCT 45.3 11/19/2021   MCV 89.2 11/19/2021   PLT 174 11/19/2021   No results found for: "IRON", "TIBC", "FERRITIN"  Attestation Statements:   Reviewed by clinician on day of visit: allergies, medications, problem list, medical history, surgical history, family history, social history, and previous encounter notes.  I, Elnora Morrison, RMA am acting as transcriptionist for Coralie Common, MD.  I have reviewed the above documentation for accuracy and completeness, and I agree with the above. -  ***

## 2022-03-30 ENCOUNTER — Other Ambulatory Visit: Payer: Self-pay | Admitting: Cardiology

## 2022-03-30 DIAGNOSIS — I4891 Unspecified atrial fibrillation: Secondary | ICD-10-CM

## 2022-04-09 ENCOUNTER — Ambulatory Visit (INDEPENDENT_AMBULATORY_CARE_PROVIDER_SITE_OTHER): Payer: BC Managed Care – PPO | Admitting: Family Medicine

## 2022-04-09 ENCOUNTER — Encounter (INDEPENDENT_AMBULATORY_CARE_PROVIDER_SITE_OTHER): Payer: Self-pay | Admitting: Family Medicine

## 2022-04-09 VITALS — BP 106/72 | HR 68 | Temp 97.8°F | Ht 69.0 in | Wt 311.0 lb

## 2022-04-09 DIAGNOSIS — E7849 Other hyperlipidemia: Secondary | ICD-10-CM

## 2022-04-09 DIAGNOSIS — E669 Obesity, unspecified: Secondary | ICD-10-CM | POA: Diagnosis not present

## 2022-04-09 DIAGNOSIS — Z6841 Body Mass Index (BMI) 40.0 and over, adult: Secondary | ICD-10-CM | POA: Diagnosis not present

## 2022-04-13 NOTE — Progress Notes (Signed)
Chief Complaint:   OBESITY Dakota Hernandez is here to discuss his progress with his obesity treatment plan along with follow-up of his obesity related diagnoses. Dakota Hernandez is on the Category 4 Plan and states he is following his eating plan approximately 60% of the time. Dakota Hernandez states he is exercising 0 minutes 0 times per week.  Today's visit was #: 18 Starting weight: 382 lbs Starting date: 01/28/2021 Today's weight: 311 lbs Today's date: 04/09/2022 Total lbs lost to date: 71 lbs Total lbs lost since last in-office visit: 0  Interim History: Dakota Hernandez has been mostly interviewing for new jobs- got offered a job this am and going to make a decision today. Dinner has been more on track daily but lunch has been indulgent, choosing fast food or pizza (mostly eating toppings). Plans to get food for lunch at home today.  Subjective:   1. Other hyperlipidemia Chadwick's last LDL of 112, HDL of 57, and Trigly 73. He is not currently taking medication.  Assessment/Plan:   1. Other hyperlipidemia We will repeat labs in Oct/Nov 2023. LDL at goal.  2. Obesity with current BMI of 46.0 Dakota Hernandez is currently in the action stage of change. As such, his goal is to continue with weight loss efforts. He has agreed to the Category 4 Plan.   Exercise goals: All adults should avoid inactivity. Some physical activity is better than none, and adults who participate in any amount of physical activity gain some health benefits.  Behavioral modification strategies: increasing lean protein intake, meal planning and cooking strategies, keeping healthy foods in the home, and planning for success.  Dakota Hernandez has agreed to follow-up with our clinic in 5 weeks. He was informed of the importance of frequent follow-up visits to maximize his success with intensive lifestyle modifications for his multiple health conditions.   Objective:   Blood pressure 106/72, pulse 68, temperature 97.8 F (36.6 C), height 5\' 9"  (1.753 m),  weight (!) 311 lb (141.1 kg). Body mass index is 45.93 kg/m.  General: Cooperative, alert, well developed, in no acute distress. HEENT: Conjunctivae and lids unremarkable. Cardiovascular: Regular rhythm.  Lungs: Normal work of breathing. Neurologic: No focal deficits.   Lab Results  Component Value Date   CREATININE 1.11 03/11/2022   BUN 16 03/11/2022   NA 142 03/11/2022   K 4.4 03/11/2022   CL 102 03/11/2022   CO2 25 03/11/2022   Lab Results  Component Value Date   ALT 17 03/11/2022   AST 18 03/11/2022   ALKPHOS 89 03/11/2022   BILITOT 0.8 03/11/2022   Lab Results  Component Value Date   HGBA1C 5.4 03/11/2022   HGBA1C 5.0 11/19/2021   HGBA1C 5.6 06/25/2021   HGBA1C 6.2 (H) 01/28/2021   HGBA1C 6.0 10/30/2020   Lab Results  Component Value Date   INSULIN 13.6 03/11/2022   INSULIN 8.4 06/25/2021   INSULIN 17.7 01/28/2021   Lab Results  Component Value Date   TSH 0.875 01/28/2021   Lab Results  Component Value Date   CHOL 183 03/11/2022   HDL 57 03/11/2022   LDLCALC 112 (H) 03/11/2022   TRIG 73 03/11/2022   CHOLHDL 4.0 CALC 06/14/2008   Lab Results  Component Value Date   VD25OH 65.3 03/11/2022   VD25OH 55.6 06/25/2021   VD25OH 11.0 (L) 01/28/2021   Lab Results  Component Value Date   WBC 4.9 11/19/2021   HGB 15.2 11/19/2021   HCT 45.3 11/19/2021   MCV 89.2 11/19/2021   PLT 174  11/19/2021   No results found for: "IRON", "TIBC", "FERRITIN"  Attestation Statements:   Reviewed by clinician on day of visit: allergies, medications, problem list, medical history, surgical history, family history, social history, and previous encounter notes.  I, Fortino Sic, RMA am acting as transcriptionist for Reuben Likes, MD.  I have reviewed the above documentation for accuracy and completeness, and I agree with the above. - Reuben Likes, MD

## 2022-04-24 ENCOUNTER — Ambulatory Visit: Payer: BC Managed Care – PPO | Admitting: Internal Medicine

## 2022-05-12 ENCOUNTER — Ambulatory Visit (INDEPENDENT_AMBULATORY_CARE_PROVIDER_SITE_OTHER): Payer: BC Managed Care – PPO | Admitting: Family Medicine

## 2022-05-12 ENCOUNTER — Encounter (INDEPENDENT_AMBULATORY_CARE_PROVIDER_SITE_OTHER): Payer: Self-pay | Admitting: Family Medicine

## 2022-05-12 VITALS — BP 111/77 | HR 80 | Temp 97.5°F | Ht 69.0 in | Wt 329.0 lb

## 2022-05-12 DIAGNOSIS — E66813 Obesity, class 3: Secondary | ICD-10-CM

## 2022-05-12 DIAGNOSIS — F3289 Other specified depressive episodes: Secondary | ICD-10-CM

## 2022-05-12 DIAGNOSIS — E669 Obesity, unspecified: Secondary | ICD-10-CM | POA: Diagnosis not present

## 2022-05-12 DIAGNOSIS — F32A Depression, unspecified: Secondary | ICD-10-CM | POA: Insufficient documentation

## 2022-05-12 DIAGNOSIS — Z6841 Body Mass Index (BMI) 40.0 and over, adult: Secondary | ICD-10-CM

## 2022-05-12 DIAGNOSIS — I4891 Unspecified atrial fibrillation: Secondary | ICD-10-CM

## 2022-05-12 MED ORDER — BUPROPION HCL ER (SR) 150 MG PO TB12: 150.0000 mg | ORAL_TABLET | Freq: Every day | ORAL | 0 refills | Status: DC

## 2022-05-13 ENCOUNTER — Encounter (INDEPENDENT_AMBULATORY_CARE_PROVIDER_SITE_OTHER): Payer: Self-pay

## 2022-05-13 ENCOUNTER — Telehealth: Payer: Self-pay

## 2022-05-13 NOTE — Telephone Encounter (Signed)
   Primary Cardiologist: Olga Millers, MD  Chart reviewed as part of pre-operative protocol coverage. Simple dental extractions (1-2 teeth) are considered low risk procedures per guidelines and generally do not require any specific cardiac clearance. It is also generally accepted that for simple extractions and dental cleanings, there is no need to interrupt blood thinner therapy.   SBE prophylaxis is not required for the patient.  I will route this recommendation to the requesting party via Epic fax function and remove from pre-op pool.  Please call with questions.  Levi Aland, NP-C    05/13/2022, 4:29 PM [facility] [address] Office [phone] Fax [phone]

## 2022-05-13 NOTE — Telephone Encounter (Signed)
   Pre-operative Risk Assessment    Patient Name: Dakota Hernandez  DOB: 10/07/1965 MRN: [id number]     Request for Surgical Clearance    Procedure:  Dental Extraction - Amount of Teeth to be Pulled:  2  Date of Surgery:  Clearance TBD                                 Surgeon:  Beatrice Lecher, DDS, PA Surgeon's Group or Practice Name:  [facility] Phone number:  [phone] Fax number:  [phone]   Type of Clearance Requested:   - Pharmacy:  Hold Apixaban (Eliquis) 2   Type of Anesthesia:  Local    Additional requests/questions:    Signed, Irena Cords Leena Tiede   05/13/2022, 1:29 PM

## 2022-05-19 NOTE — Progress Notes (Signed)
Chief Complaint:   OBESITY Dakota Hernandez is here to discuss his progress with his obesity treatment plan along with follow-up of his obesity related diagnoses. Dakota Hernandez is on the Category 4 Plan and states he is following his eating plan approximately 60-70% of the time. Dakota Hernandez states he is doing 0 minutes 0 times per week.  Today's visit was #: 19 Starting weight: 382 lbs Starting date: 01/28/2021 Today's weight: 329 lbs Today's date: 05/12/2022 Total lbs lost to date: 53 Total lbs lost since last in-office visit: 0  Interim History: Dakota Hernandez has been out of work and he is off his normal routine. He is snacking more, and he is missing meals and eating emotionally.  Subjective:   1. Atrial fibrillation, unspecified type Dakota Hernandez) Dakota Hernandez is on B-blocker, Diltiazem, and Eliquis. He cannot tell if he is out of rhythm.   2. Other depression, emotional eating behaviors Dakota Hernandez notes increased emotional eating behaviors, and worse since being out of work. He notes eating when he is not hungry throughout the day.   Assessment/Plan:   1. Atrial fibrillation, unspecified type [facility]) Dakota Hernandez will continue working on his weight loss efforts to help improve his aFib and decrease exacerbations.   2. Other depression, emotional eating behaviors Dakota Hernandez agreed to start Wellbutrin SR 150 mg q AM with no refills. Behavior modification techniques were discussed today to help Genaro deal with his emotional/non-hunger eating behaviors.  Orders and follow up as documented in patient record.   - buPROPion (WELLBUTRIN SR) 150 MG 12 hr tablet; Take 1 tablet (150 mg total) by mouth daily.  Dispense: 30 tablet; Refill: 0  3. Obesity, Current BMI 48.7 Dakota Hernandez is currently in the action stage of change. As such, his goal is to continue with weight loss efforts. He has agreed to the Category 4 Plan.   Exercise goals: As is.   Behavioral modification strategies: increasing lean protein intake and emotional eating  strategies.  Dakota Hernandez has agreed to follow-up with our clinic in 4 weeks. He was informed of the importance of frequent follow-up visits to maximize his success with intensive lifestyle modifications for his multiple health conditions.   Objective:   Blood pressure 111/77, pulse 80, temperature (!) 97.5 F (36.4 C), height 5\' 9"  (1.753 m), weight (!) 329 lb (149.2 kg), SpO2 96 %. Body mass index is 48.58 kg/m.  General: Cooperative, alert, well developed, in no acute distress. HEENT: Conjunctivae and lids unremarkable. Cardiovascular: Regular rhythm.  Lungs: Normal work of breathing. Neurologic: No focal deficits.   Lab Results  Component Value Date   CREATININE 1.11 03/11/2022   BUN 16 03/11/2022   NA 142 03/11/2022   K 4.4 03/11/2022   CL 102 03/11/2022   CO2 25 03/11/2022   Lab Results  Component Value Date   ALT 17 03/11/2022   AST 18 03/11/2022   ALKPHOS 89 03/11/2022   BILITOT 0.8 03/11/2022   Lab Results  Component Value Date   HGBA1C 5.4 03/11/2022   HGBA1C 5.0 11/19/2021   HGBA1C 5.6 06/25/2021   HGBA1C 6.2 (H) 01/28/2021   HGBA1C 6.0 10/30/2020   Lab Results  Component Value Date   INSULIN 13.6 03/11/2022   INSULIN 8.4 06/25/2021   INSULIN 17.7 01/28/2021   Lab Results  Component Value Date   TSH 0.875 01/28/2021   Lab Results  Component Value Date   CHOL 183 03/11/2022   HDL 57 03/11/2022   LDLCALC 112 (H) 03/11/2022   TRIG 73 03/11/2022   CHOLHDL  4.0 CALC 06/14/2008   Lab Results  Component Value Date   VD25OH 65.3 03/11/2022   VD25OH 55.6 06/25/2021   VD25OH 11.0 (L) 01/28/2021   Lab Results  Component Value Date   WBC 4.9 11/19/2021   HGB 15.2 11/19/2021   HCT 45.3 11/19/2021   MCV 89.2 11/19/2021   PLT 174 11/19/2021   No results found for: "IRON", "TIBC", "FERRITIN"  Attestation Statements:   Reviewed by clinician on day of visit: allergies, medications, problem list, medical history, surgical history, family history, social  history, and previous encounter notes.   I, Burt Knack, am acting as transcriptionist for Quillian Quince, MD.  I have reviewed the above documentation for accuracy and completeness, and I agree with the above. -  Quillian Quince, MD

## 2022-05-26 ENCOUNTER — Encounter: Payer: Self-pay | Admitting: Internal Medicine

## 2022-05-27 NOTE — Progress Notes (Signed)
HPI Never smoker followed for OSA, complicated by Chronic Venous Insufficiency, Permanent AFib/ Eliquis, HTN, CM/CHF, DM, Morbid Obesity NPSG 05/06/20- AHI 22.4/ hr, desaturation to 67%,  CPAP to 12, body weight 366 lbs  ======================================================   04/24/21- 54 yoM never smoker followed for OSA, complicated by Chronic Venous Insufficiency, Permanent AFib/ Eliquis, HTN, CM/CHF, DM, Morbid Obesity, Cellulitis R leg,  CPAP 5-15/ [facility]           -new order 07/23/20    AirSense 11 AutoSet Download- compliance 100%, AHI 4.3/ hr Body weight today- 338 lbs Covid vax- -----Reports sleeping better with CPAP use Sleeping better, better rested with CPAP.Occasional restless night. We discussed trial of otc sleep aid initially if needed.  05/28/22- 55 yoM never smoker followed for OSA, complicated by Chronic Venous Insufficiency, Permanent AFib/ Eliquis, HTN, CM/CHF, DM, Morbid Obesity, Cellulitis R leg,  CPAP 5-15/ [facility]                                        -new order 07/23/20    AirSense 11 AutoSet Download- compliance 53%, AHI 0.4/ hr Body weight today- 336 lbs Covid vax- He lost his job when [employer] closed and has been substantially stressed seeking work.  This is clearly impacting his sleep. Download reviewed.  He sleeps better when he uses his machine.  Comfort issues reviewed.  ROS-see HPI   + = positive Constitutional:    weight loss, night sweats, fevers, chills, fatigue, lassitude. HEENT:    headaches, difficulty swallowing, tooth/dental problems, sore throat,       sneezing, itching, ear ache, nasal congestion, post nasal drip, snoring CV:    chest pain, orthopnea, PND, +swelling in lower extremities, anasarca,                                  dizziness, +palpitations Resp:   +shortness of breath with exertion or at rest.                productive cough,   non-productive cough, coughing up of blood.              change in color of mucus.  wheezing.   Skin:    rash  or lesions. GI:  No-   heartburn, indigestion, abdominal pain, nausea, vomiting, diarrhea,                 change in bowel habits, loss of appetite GU: dysuria, change in color of urine, no urgency or frequency.   flank pain. MS:   joint pain, stiffness, decreased range of motion, back pain. Neuro-     nothing unusual Psych:  change in mood or affect.  depression or anxiety.   memory loss.  OBJ- Physical Exam General- Alert, Oriented, Affect-appropriate, Distress- none acute,  + morbidly obese Skin- rash-none, lesions- none, excoriation- none Lymphadenopathy- none Head- atraumatic            Eyes- Gross vision intact, PERRLA, conjunctivae and secretions clear            Ears- Hearing, canals-normal            Nose- Clear, no-Septal dev, mucus, polyps, erosion, perforation             Throat- Mallampati III_IV , mucosa clear , drainage- none, tonsils- atrophic,  + teeth Neck- flexible ,  trachea midline, no stridor , thyroid nl, carotid no bruit Chest - symmetrical excursion , unlabored           Heart/CV- +IRR/ AFib , no murmur , no gallop  , no rub, nl s1 s2                           - JVD+1 , edema+tight 4+, stasis changes- none, varices- none           Lung- clear to P&A, wheeze- none, cough- none , dullness-none, rub- none           Chest wall-  Abd-  Br/ Gen/ Rectal- Not done, not indicated Extrem- cyanosis- none, clubbing, none, atrophy- none, strength- nl Neuro- grossly intact to observation

## 2022-05-28 ENCOUNTER — Ambulatory Visit (INDEPENDENT_AMBULATORY_CARE_PROVIDER_SITE_OTHER): Payer: BC Managed Care – PPO | Admitting: Internal Medicine

## 2022-05-28 ENCOUNTER — Encounter: Payer: Self-pay | Admitting: Internal Medicine

## 2022-05-28 DIAGNOSIS — I4821 Permanent atrial fibrillation: Secondary | ICD-10-CM

## 2022-05-28 DIAGNOSIS — G4733 Obstructive sleep apnea (adult) (pediatric): Secondary | ICD-10-CM | POA: Diagnosis not present

## 2022-05-28 NOTE — Patient Instructions (Addendum)
Sorry about the job stress - I hope that gets resolved soon.  Try to get out and move- it will help you feel better  Order- DME [facility]- continue CPAP auto 5-15, mask of choice, humidifier, supplies, AirView/ card

## 2022-06-09 ENCOUNTER — Other Ambulatory Visit (INDEPENDENT_AMBULATORY_CARE_PROVIDER_SITE_OTHER): Payer: Self-pay | Admitting: Family Medicine

## 2022-06-09 ENCOUNTER — Encounter (INDEPENDENT_AMBULATORY_CARE_PROVIDER_SITE_OTHER): Payer: Self-pay | Admitting: Family Medicine

## 2022-06-09 ENCOUNTER — Ambulatory Visit (INDEPENDENT_AMBULATORY_CARE_PROVIDER_SITE_OTHER): Payer: Commercial Managed Care - HMO | Admitting: Family Medicine

## 2022-06-09 VITALS — BP 114/76 | HR 61 | Temp 98.4°F | Ht 69.0 in | Wt 330.0 lb

## 2022-06-09 DIAGNOSIS — Z6841 Body Mass Index (BMI) 40.0 and over, adult: Secondary | ICD-10-CM | POA: Diagnosis not present

## 2022-06-09 DIAGNOSIS — E559 Vitamin D deficiency, unspecified: Secondary | ICD-10-CM | POA: Diagnosis not present

## 2022-06-09 DIAGNOSIS — F3289 Other specified depressive episodes: Secondary | ICD-10-CM

## 2022-06-09 DIAGNOSIS — E669 Obesity, unspecified: Secondary | ICD-10-CM | POA: Diagnosis not present

## 2022-06-09 MED ORDER — BUPROPION HCL ER (XL) 150 MG PO TB24: 150.0000 mg | ORAL_TABLET | Freq: Every day | ORAL | 0 refills | Status: DC

## 2022-06-10 ENCOUNTER — Encounter: Payer: Self-pay | Admitting: Cardiology

## 2022-06-10 NOTE — Assessment & Plan Note (Signed)
Benefits from CPAP when used.  Medical importance of continuing control reviewed and comfort measures discussed. Plan-continue auto 5-15

## 2022-06-10 NOTE — Assessment & Plan Note (Signed)
Ongoing encouragement to work with diet and exercise.

## 2022-06-10 NOTE — Assessment & Plan Note (Signed)
Rhythm close to regular by palpation at this exam.  Follows with cardiology.

## 2022-06-11 ENCOUNTER — Telehealth: Payer: Self-pay

## 2022-06-11 NOTE — Progress Notes (Signed)
Chief Complaint:   OBESITY Dakota Hernandez is here to discuss his progress with his obesity treatment plan along with follow-up of his obesity related diagnoses. Dakota Hernandez is on the Category 4 Plan and states he is following his eating plan approximately 50% of the time. Dakota Hernandez states he is walking 30 minutes 3 times per week.  Today's visit was #: 20 Starting weight: 382 lbs Starting date: 01/28/2021 Today's weight: 330 lbs Today's date: 06/09/2022 Total lbs lost to date: 52 lbs Total lbs lost since last in-office visit: 0  Interim History: Dakota Hernandez is still searching for a job after [employer] closing. Unfortunately he has yet to secure a job. Got severance and filed unemployment. He mentions that finances have led him to be less strict on plan. 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  Subjective:   1. Vitamin D deficiency Dakota Hernandez is on over the counter Vit D. His last Vit D level of 65.3.  2. Other depression, emotional eating behaviors Long noticing some decrease in eating/snacking. Denies suicidal ideas, and homicidal ideas. He is on Wellbutrin 150 mg daily.  Assessment/Plan:   1. Vitamin D deficiency Continue taking over the counter Vit D.  2. Other depression, emotional eating behaviors We will refill Wellbutrin 150 mg by mouth daily for 1 month with 0 refills. He can increase to 150 mg 2 capsules daily.  -Refill buPROPion (WELLBUTRIN XL) 150 MG 24 hr tablet; Take 1 tablet (150 mg total) by mouth daily.  Dispense: 90 tablet; Refill: 0  3. Obesity, Current BMI 48.7 Dakota Hernandez is currently in the action stage of change. As such, his goal is to continue with weight loss efforts. He has agreed to the Category 4 Plan.   Exercise goals: All adults should avoid inactivity. Some physical activity is better than none, and adults who participate in any amount of physical activity gain some health benefits.  Behavioral modification strategies:  increasing lean protein intake, meal planning and cooking strategies, keeping healthy foods in the home, and planning for success.  Dakota Hernandez has agreed to follow-up with our clinic in 6 weeks. He was informed of the importance of frequent follow-up visits to maximize his success with intensive lifestyle modifications for his multiple health conditions.   Objective:   Blood pressure 114/76, pulse 61, temperature 98.4 F (36.9 C), height 5\' 9"  (1.753 m), weight (!) 330 lb (149.7 kg), SpO2 97 %. Body mass index is 48.73 kg/m.  General: Cooperative, alert, well developed, in no acute distress. HEENT: Conjunctivae and lids unremarkable. Cardiovascular: Regular rhythm.  Lungs: Normal work of breathing. Neurologic: No focal deficits.   Lab Results  Component Value Date   CREATININE 1.11 03/11/2022   BUN 16 03/11/2022   NA 142 03/11/2022   K 4.4 03/11/2022   CL 102 03/11/2022   CO2 25 03/11/2022   Lab Results  Component Value Date   ALT 17 03/11/2022   AST 18 03/11/2022   ALKPHOS 89 03/11/2022   BILITOT 0.8 03/11/2022   Lab Results  Component Value Date   HGBA1C 5.4 03/11/2022   HGBA1C 5.0 11/19/2021   HGBA1C 5.6 06/25/2021   HGBA1C 6.2 (H) 01/28/2021   HGBA1C 6.0 10/30/2020   Lab Results  Component Value Date   INSULIN 13.6 03/11/2022   INSULIN 8.4 06/25/2021   INSULIN 17.7 01/28/2021   Lab Results  Component Value Date   TSH 0.875 01/28/2021   Lab Results  Component Value Date  CHOL 183 03/11/2022   HDL 57 03/11/2022   LDLCALC 112 (H) 03/11/2022   TRIG 73 03/11/2022   CHOLHDL 4.0 CALC 06/14/2008   Lab Results  Component Value Date   VD25OH 65.3 03/11/2022   VD25OH 55.6 06/25/2021   VD25OH 11.0 (L) 01/28/2021   Lab Results  Component Value Date   WBC 4.9 11/19/2021   HGB 15.2 11/19/2021   HCT 45.3 11/19/2021   MCV 89.2 11/19/2021   PLT 174 11/19/2021   No results found for: "IRON", "TIBC", "FERRITIN"  Attestation Statements:   Reviewed by clinician  on day of visit: allergies, medications, problem list, medical history, surgical history, family history, social history, and previous encounter notes.  I, Fortino Sic, RMA am acting as transcriptionist for Reuben Likes, MD.  I have reviewed the above documentation for accuracy and completeness, and I agree with the above. - Reuben Likes, MD

## 2022-06-11 NOTE — Telephone Encounter (Signed)
**Note De-Identified Dakota Hernandez Obfuscation** The pt has a new ins plan through [place] (Not [employer] as advised by [facility] as the pts [employer] plan handles their own PAs). YJ:[id number] BIN: [id number] PCN:[id number] RXGRP: [id number]  I called [employer] at [phone] and did this Eliquis PA with Christy over the phone. Per Neysa Bonito it has been approved until 06/11/2023. Case ID: [id number]  I have notified [facility] of this approval Dakota Hernandez telephone and the pt Dakota Hernandez his [facility] message.

## 2022-07-21 ENCOUNTER — Encounter (INDEPENDENT_AMBULATORY_CARE_PROVIDER_SITE_OTHER): Payer: Self-pay | Admitting: Family Medicine

## 2022-07-21 ENCOUNTER — Ambulatory Visit (INDEPENDENT_AMBULATORY_CARE_PROVIDER_SITE_OTHER): Payer: Commercial Managed Care - HMO | Admitting: Family Medicine

## 2022-07-21 VITALS — BP 119/85 | HR 56 | Temp 97.7°F | Ht 69.0 in | Wt 331.0 lb

## 2022-07-21 DIAGNOSIS — E669 Obesity, unspecified: Secondary | ICD-10-CM | POA: Diagnosis not present

## 2022-07-21 DIAGNOSIS — Z6841 Body Mass Index (BMI) 40.0 and over, adult: Secondary | ICD-10-CM

## 2022-07-21 DIAGNOSIS — E559 Vitamin D deficiency, unspecified: Secondary | ICD-10-CM | POA: Diagnosis not present

## 2022-07-21 DIAGNOSIS — I4891 Unspecified atrial fibrillation: Secondary | ICD-10-CM | POA: Diagnosis not present

## 2022-07-21 MED ORDER — APIXABAN 5 MG PO TABS: 5.0000 mg | ORAL_TABLET | Freq: Two times a day (BID) | ORAL | 1 refills | Status: DC

## 2022-07-27 NOTE — Progress Notes (Signed)
Chief Complaint:   OBESITY Dakota Hernandez is here to discuss his progress with his obesity treatment plan along with follow-up of his obesity related diagnoses. Dakota Hernandez is on the Category 4 Plan and states he is following his eating plan approximately 60-65% of the time. Dakota Hernandez states he is walking at work 8 minutes 5 times per week.  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 Most days he is off by 5pm so supper has been on plan. Has to be a t work at 7:30/8am and so breakfast is difficult. Breakfast has n=been [employer]. Lunch is normally a sandwich and fruit cup or HT fruit, slice of pepperoni pizza.  Subjective:   1. Atrial fibrillation, unspecified type [facility]) Dakota Hernandez is on Eliquis currently. Just received letter from Dr. Jacalyn Lefevre office to schedule a follow up.  2. Vitamin D deficiency Previously on RX Vit D now on 1k daily. Notes fatigue.  Assessment/Plan:   1. Atrial fibrillation, unspecified type ([place]) We will refill Eliquis 5 mg by mouth twice a day for 1 month with 0 refills.  -Refill apixaban (ELIQUIS) 5 MG TABS tablet; Take 1 tablet (5 mg total) by mouth 2 (two) times daily.  Dispense: 60 tablet; Refill: 1  2. Vitamin D deficiency Will obtain labs in Jan 2024.  3. Obesity, Current BMI 48.9 Dakota Hernandez is currently in the action stage of change. As such, his goal is to continue with weight loss efforts. He has agreed to the Category 4 Plan and keeping a food journal and adhering to recommended goals of 350-500  and 450-600 calories and 30+ and 40+ grams of protein.   Handouts given: Recipe. Discussed nutritional options from [employer].  Exercise goals: All adults should avoid inactivity. Some physical activity is better  than none, and adults who participate in any amount of physical activity gain some health benefits.  Behavioral modification strategies: increasing lean protein intake, meal planning and cooking strategies, keeping healthy foods in the home, and planning for success.  Dakota Hernandez has agreed to follow-up with our clinic in 6 weeks. He was informed of the importance of frequent follow-up visits to maximize his success with intensive lifestyle modifications for his multiple health conditions.   Objective:   Blood pressure 119/85, pulse (!) 56, temperature 97.7 F (36.5 C), height 5\' 9"  (1.753 m), weight (!) 331 lb (150.1 kg), SpO2 97 %. Body mass index is 48.88 kg/m.  General: Cooperative, alert, well developed, in no acute distress. HEENT: Conjunctivae and lids unremarkable. Cardiovascular: Regular rhythm.  Lungs: Normal work of breathing. Neurologic: No focal deficits.   Lab Results  Component Value Date   CREATININE 1.11 03/11/2022   BUN 16 03/11/2022   NA 142 03/11/2022   K 4.4 03/11/2022   CL 102 03/11/2022   CO2 25 03/11/2022   Lab Results  Component Value Date   ALT 17 03/11/2022   AST 18 03/11/2022   ALKPHOS 89 03/11/2022   BILITOT 0.8 03/11/2022   Lab Results  Component Value Date   HGBA1C 5.4 03/11/2022   HGBA1C 5.0 11/19/2021   HGBA1C 5.6 06/25/2021   HGBA1C 6.2 (H) 01/28/2021   HGBA1C 6.0 10/30/2020   Lab Results  Component Value Date   INSULIN 13.6  03/11/2022   INSULIN 8.4 06/25/2021   INSULIN 17.7 01/28/2021   Lab Results  Component Value Date   TSH 0.875 01/28/2021   Lab Results  Component Value Date   CHOL 183 03/11/2022   HDL 57 03/11/2022   LDLCALC 112 (H) 03/11/2022   TRIG 73 03/11/2022   CHOLHDL 4.0 CALC 06/14/2008   Lab Results  Component Value Date   VD25OH 65.3 03/11/2022   VD25OH 55.6 06/25/2021   VD25OH 11.0 (L) 01/28/2021   Lab Results  Component Value Date   WBC 4.9 11/19/2021   HGB 15.2 11/19/2021   HCT 45.3 11/19/2021   MCV  89.2 11/19/2021   PLT 174 11/19/2021   No results found for: "IRON", "TIBC", "FERRITIN"  Attestation Statements:   Reviewed by clinician on day of visit: allergies, medications, problem list, medical history, surgical history, family history, social history, and previous encounter notes.  I, Elnora Morrison, RMA am acting as transcriptionist for Coralie Common, MD.  I have reviewed the above documentation for accuracy and completeness, and I agree with the above. - Coralie Common, MD

## 2022-08-25 ENCOUNTER — Encounter (INDEPENDENT_AMBULATORY_CARE_PROVIDER_SITE_OTHER): Payer: Self-pay | Admitting: Family Medicine

## 2022-08-25 ENCOUNTER — Ambulatory Visit (INDEPENDENT_AMBULATORY_CARE_PROVIDER_SITE_OTHER): Payer: Commercial Managed Care - HMO | Admitting: Family Medicine

## 2022-08-25 VITALS — BP 126/87 | HR 77 | Temp 97.6°F | Ht 69.0 in | Wt 327.0 lb

## 2022-08-25 DIAGNOSIS — E559 Vitamin D deficiency, unspecified: Secondary | ICD-10-CM | POA: Diagnosis not present

## 2022-08-25 DIAGNOSIS — E669 Obesity, unspecified: Secondary | ICD-10-CM | POA: Diagnosis not present

## 2022-08-25 DIAGNOSIS — Z6841 Body Mass Index (BMI) 40.0 and over, adult: Secondary | ICD-10-CM

## 2022-08-30 IMAGING — CT CT KNEE*L* W/O CM
3 series · 15 of 35 positions shown, 18 images · non-contrast
Comparison: Radiograph performed earlier on the same date

CLINICAL DATA: Osteophyte versus fracture of the lateral tibial
plateau

EXAM:
CT OF THE LEFT KNEE WITHOUT CONTRAST
TECHNIQUE: Multidetector CT imaging of the LEFT knee was performed according to
the standard protocol. Multiplanar CT image reconstructions were
also generated.

[Series 3: axial st · axial · 0.50mm/px · z∈[-366,-124]mm · 7 of 145 slices shown, 9 images]
[im 12/145  soft-tissue]
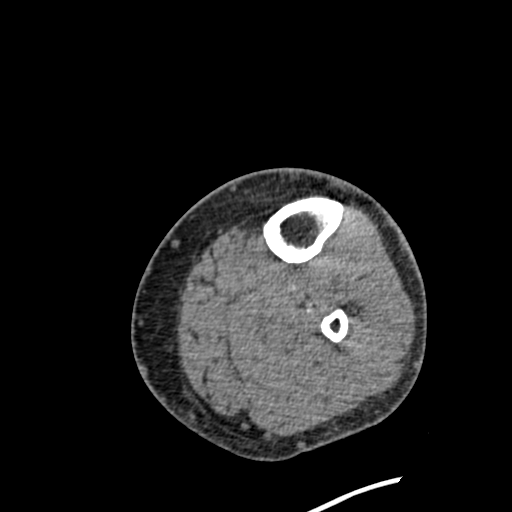
[im 12/145  bone]
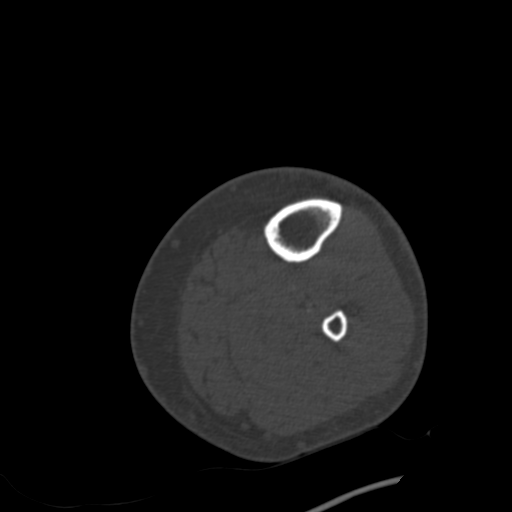
[im 34/145  bone]
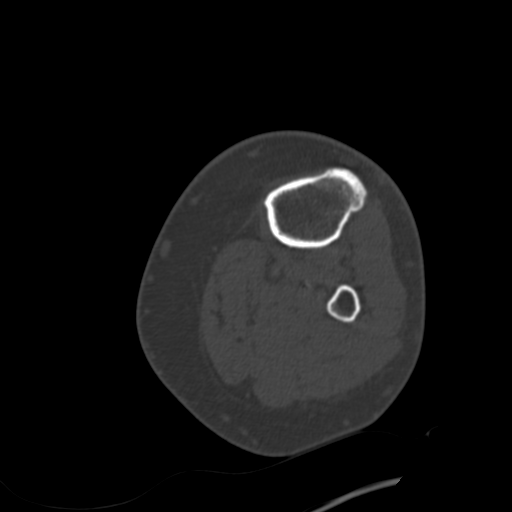
[im 56/145  bone]
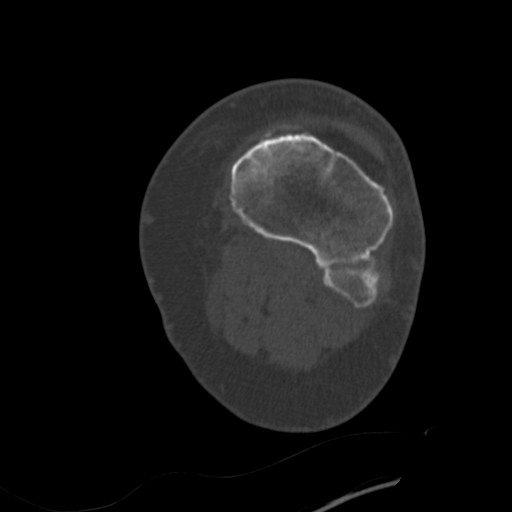
[im 78/145  bone]
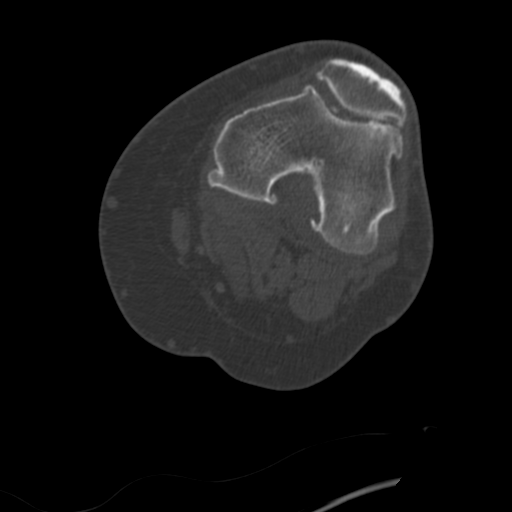
[im 89/145  soft-tissue]
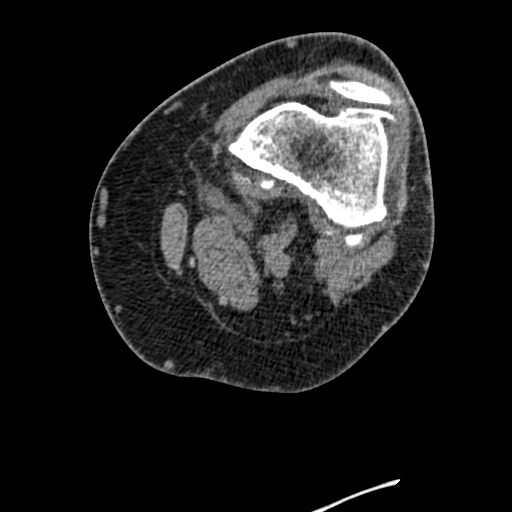
[im 89/145  bone]
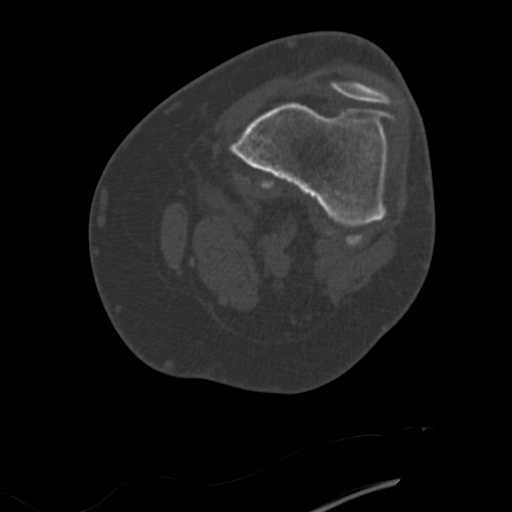
[im 111/145  bone]
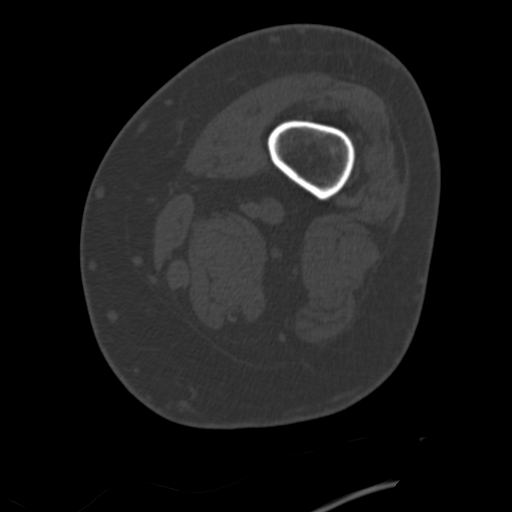
[im 133/145  bone]
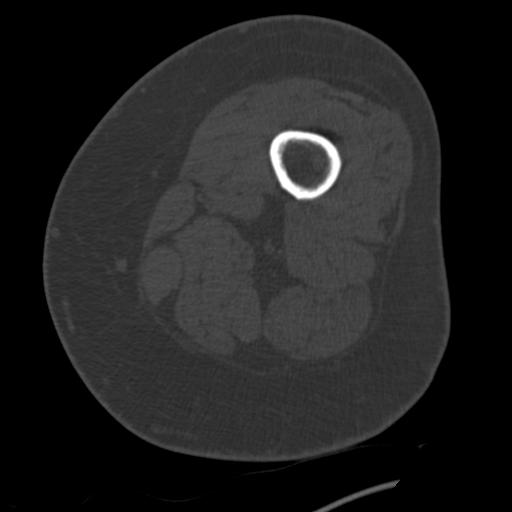

[Series 8: coronal st · coronal · 0.48mm/px · 3 of 116 slices shown]
[im 24/116  bone]
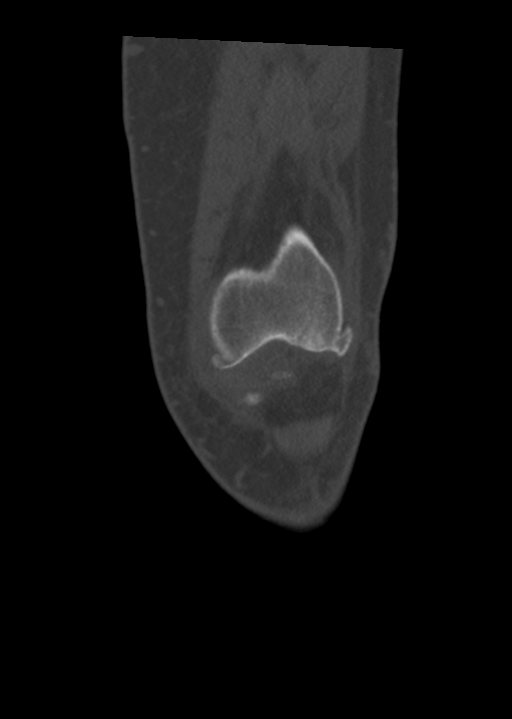
[im 47/116  bone]
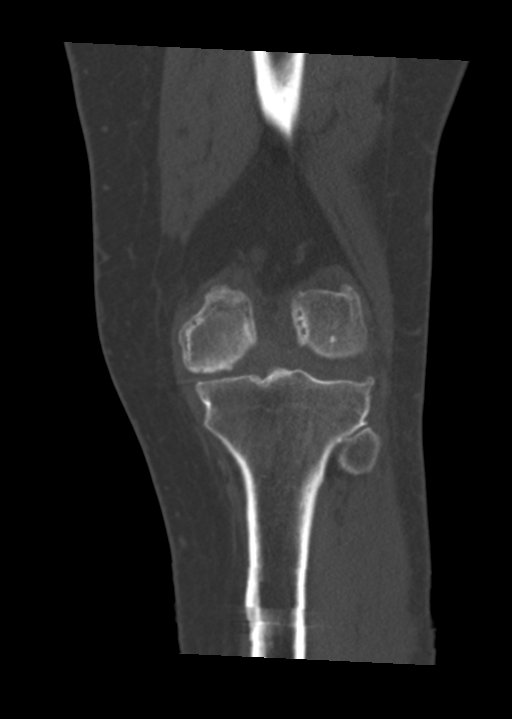
[im 70/116  bone]
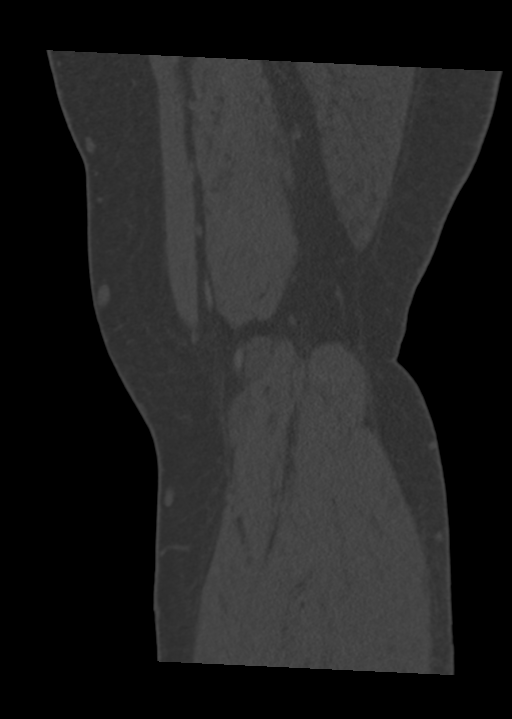

[Series 9: sagittal st · sagittal · 0.59mm/px · 5 of 98 slices shown, 6 images]
[im 33/98  bone]
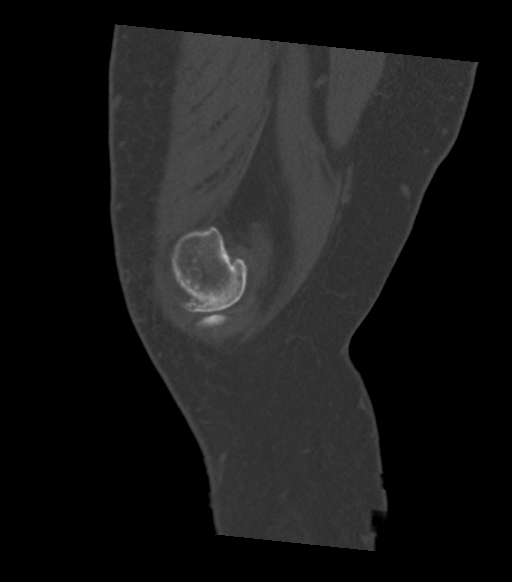
[im 41/98  bone]
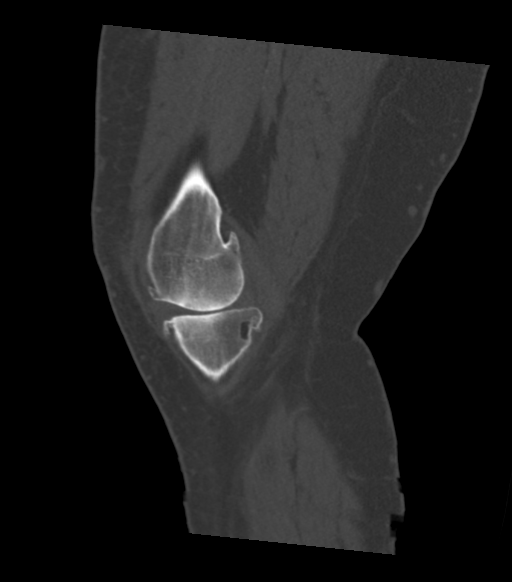
[im 49/98  soft-tissue]
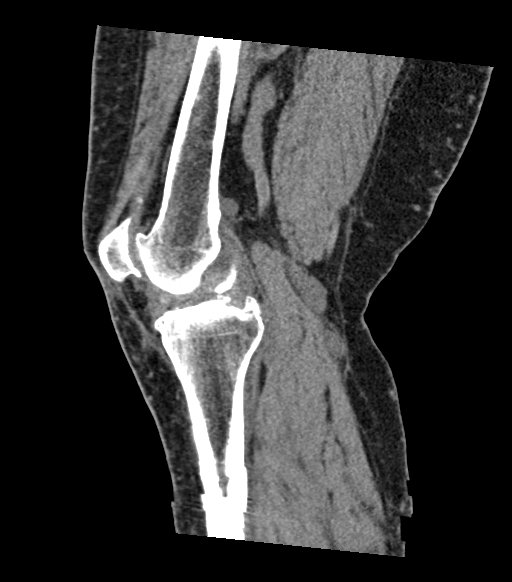
[im 49/98  bone]
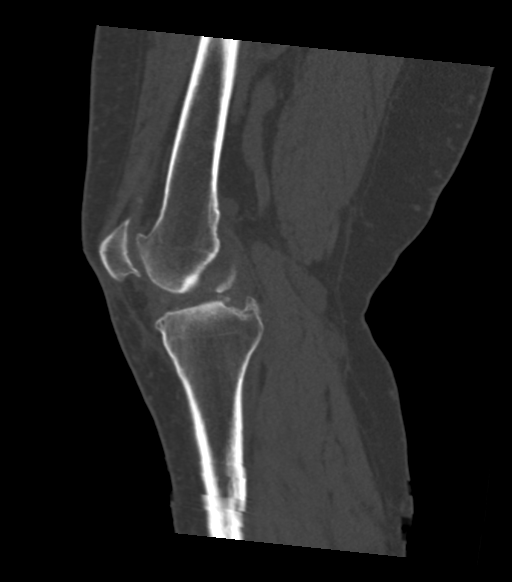
[im 57/98  bone]
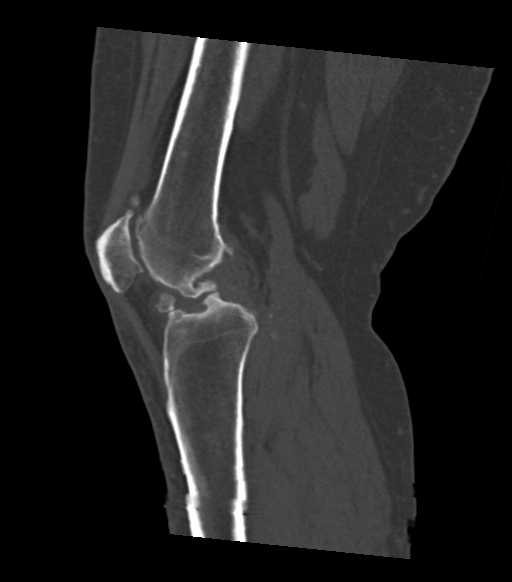
[im 65/98  bone]
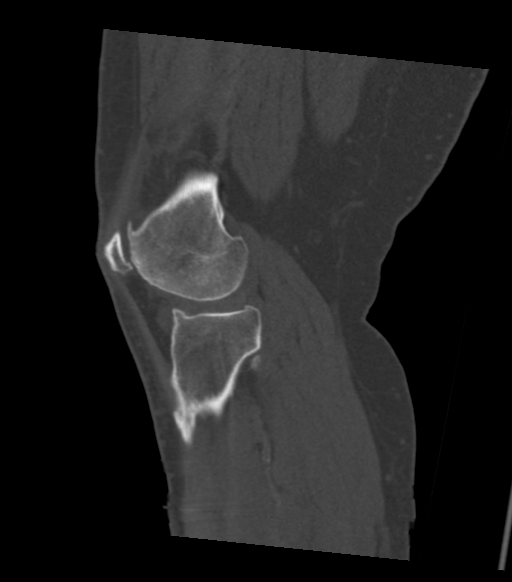

[15 of 35 positions shown; findings below may reference images not displayed]

FINDINGS: Bones/Joint/Cartilage

Advanced tricompartmental osteoarthritis. There is bone-on-bone
articulation with subchondral sclerosis in the medial tibiofemoral
compartment with prominent osteophytes. There also prominent
osteophytes in the lateral tibiofemoral compartment as well as in
the patellofemoral compartment with subchondral sclerosis. There are
intra-articular loose bodies about the anterior aspect of the knee
joint.

Ligaments

Suboptimally assessed by CT.

Muscles and Tendons

Muscles are normal in bulk without evidence of intramuscular
hematoma. No evidence of tendon tear.

Soft tissues

Nonspecific prepatellar soft tissue edema.  No hematoma or seroma.
IMPRESSION: 1. Advanced tricompartmental osteoarthritis as detailed above.
2. No evidence of fracture or dislocation. No appreciable joint
effusion.

## 2022-08-30 IMAGING — CR DG KNEE COMPLETE 4+V*L*
4 series · 4 of 4 positions shown · non-contrast
Comparison: None

CLINICAL DATA: A 55-year-old male presents with mechanical fall and
pain to leg and hip.

EXAM:
LEFT KNEE - COMPLETE 4+ VIEW

[t knee ap left]
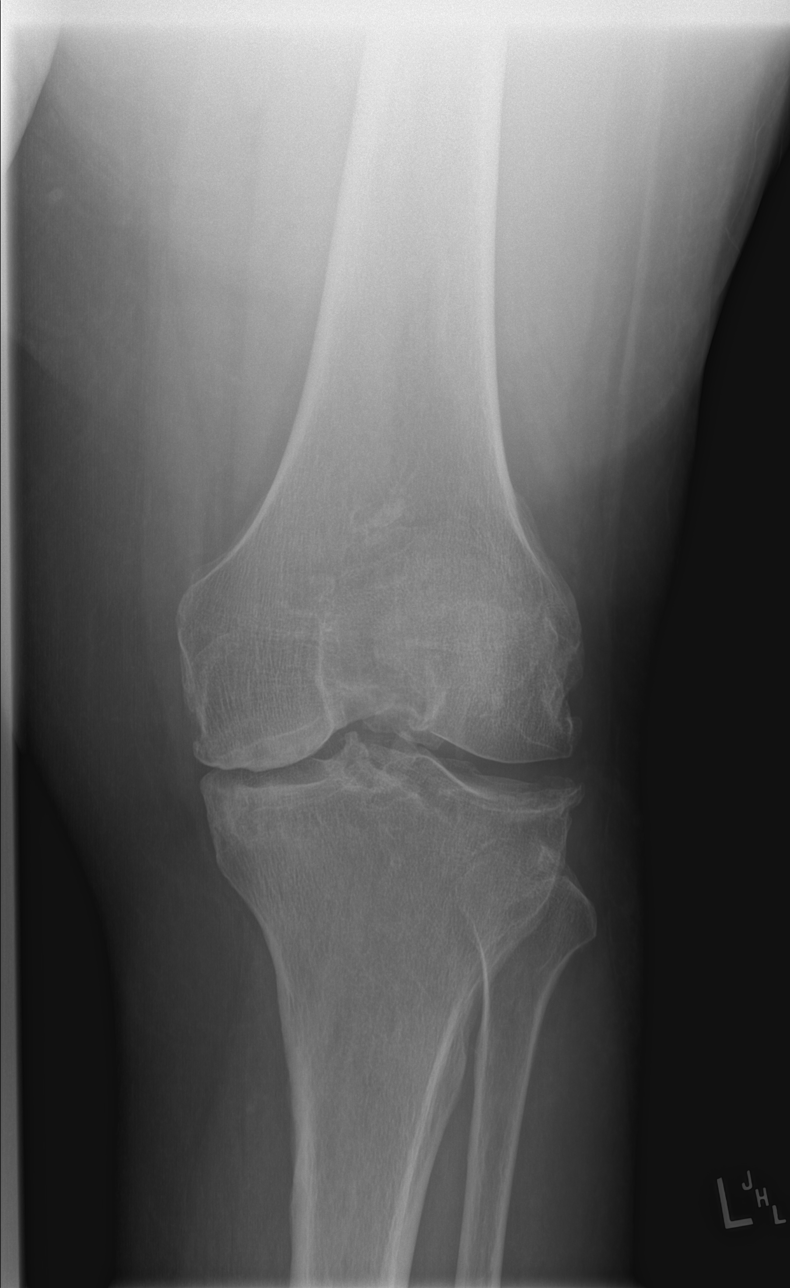

[t knee obl left (1 of 2)]
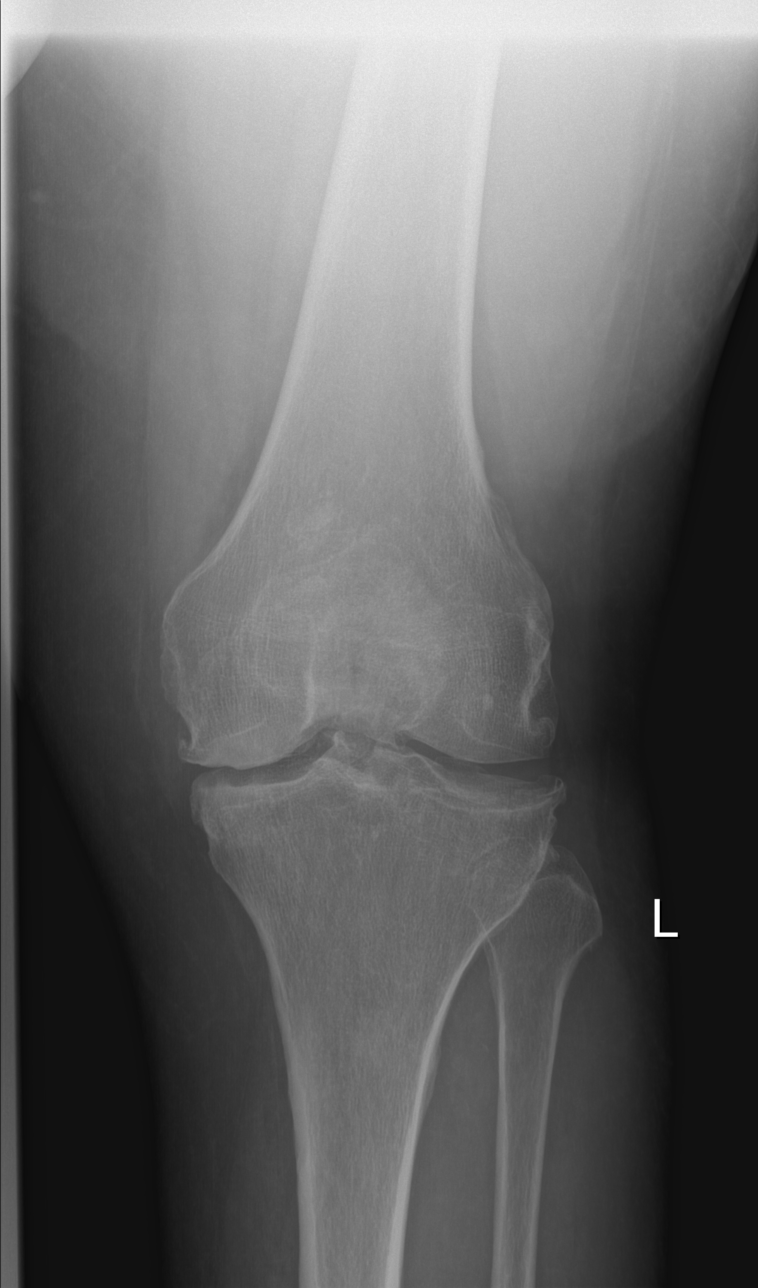

[t knee obl left (2 of 2)]
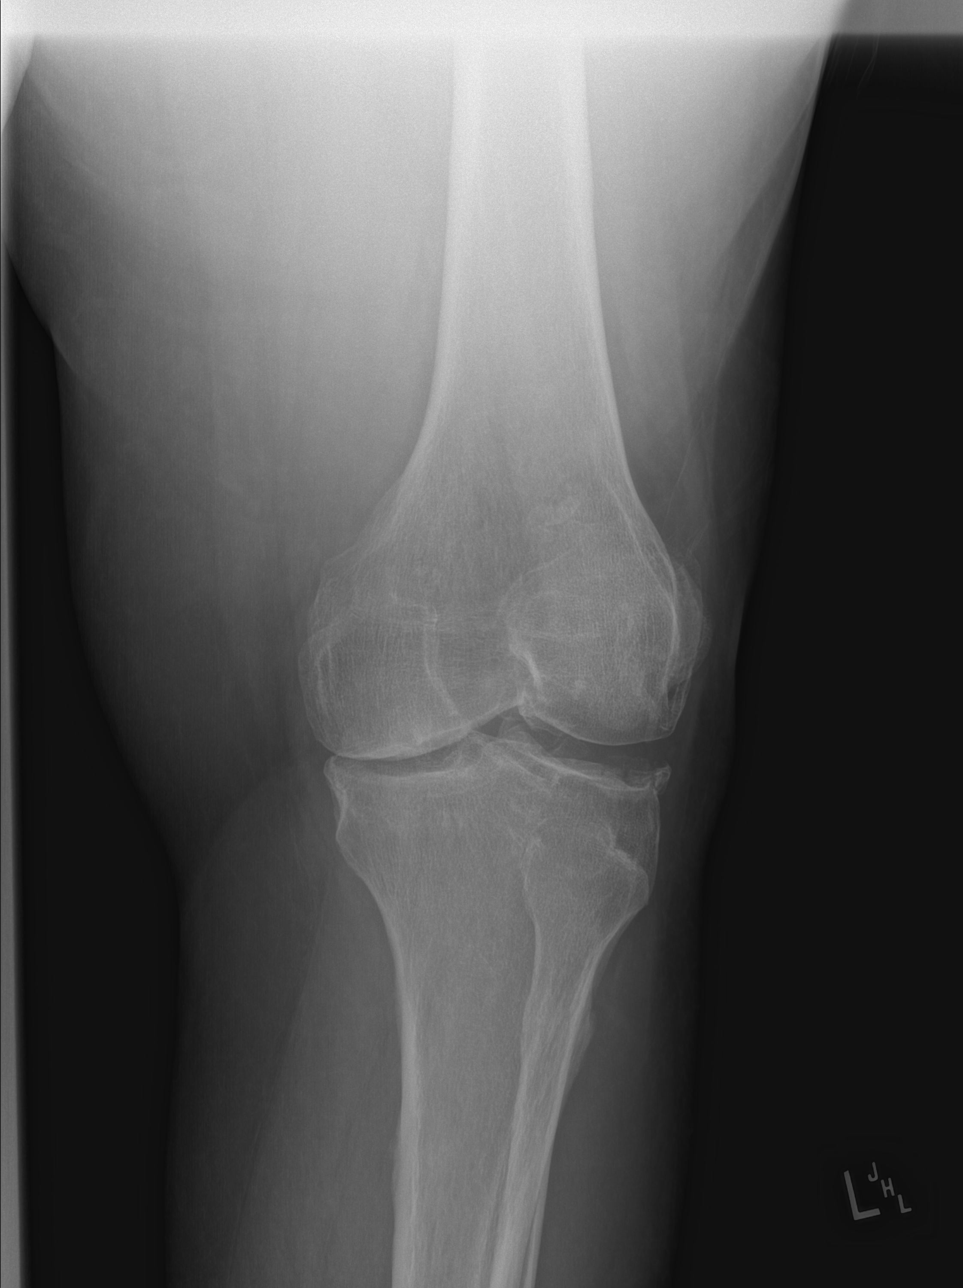

[x knee ap left]
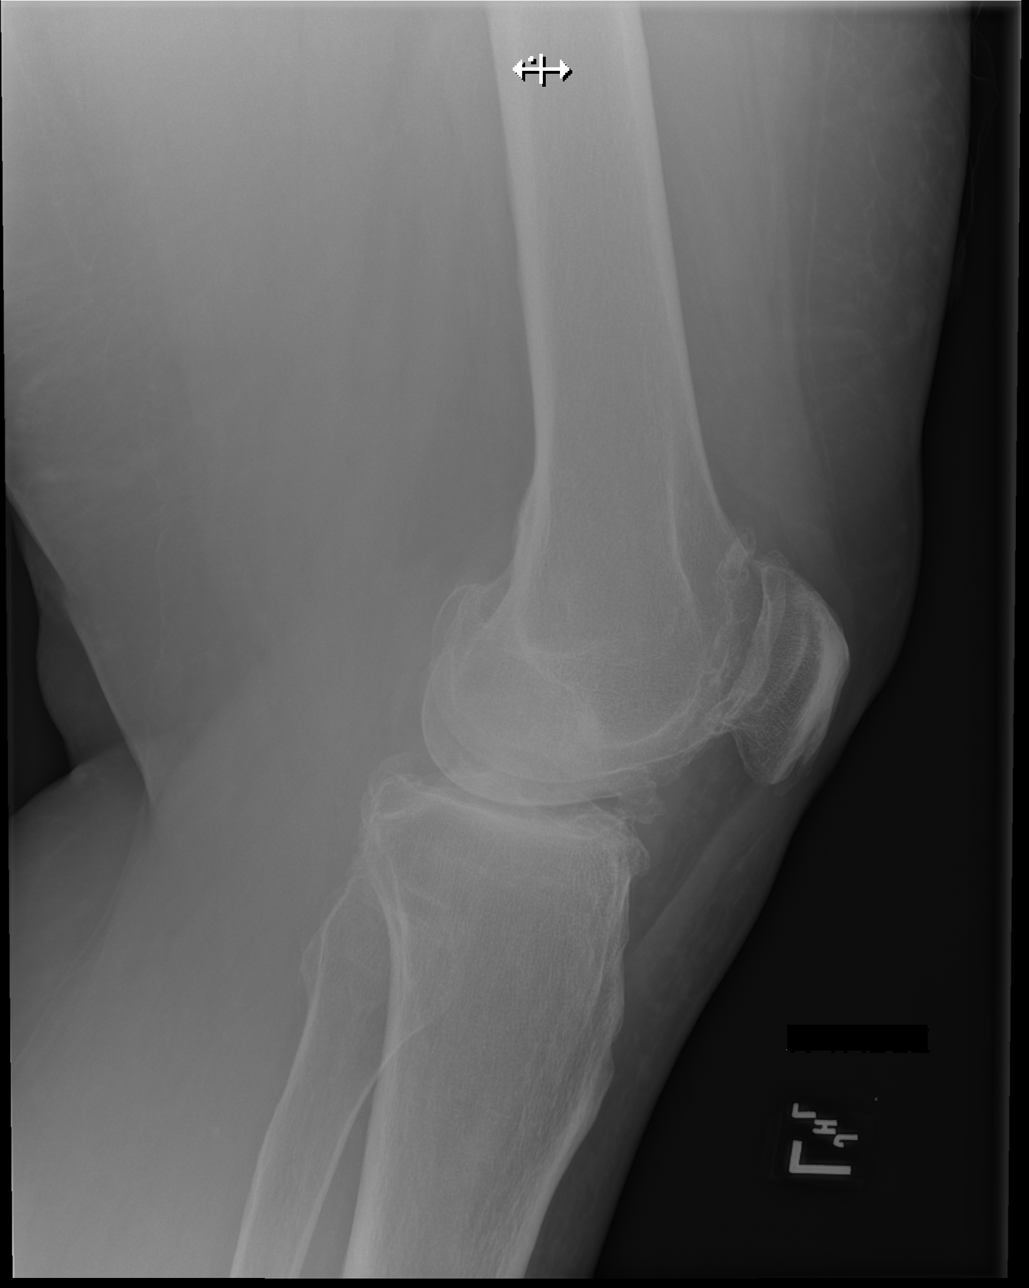

[4 of 4 positions shown; findings below may reference images not displayed]

FINDINGS: Moderate-to-marked tricompartmental osteoarthritic changes greatest
in the medial and patellofemoral compartments. No substantial joint
effusion.

Marginal osteophytes along the lateral joint line. Subtle lucency
about these osteophytes raising the question of mildly displaced
fracture along the lateral joint line involving the lateral aspect
of the tibial plateau.
IMPRESSION: Irregular osteophytes versus subtle nondisplaced or minimally
displaced fracture along the lateral tibial plateau at the margin of
the joint. No substantial overlying soft tissue swelling or joint
effusion. Correlate with point tenderness in this area.

Moderate-to-marked tricompartmental osteoarthritic changes greatest
in the medial and patellofemoral compartments.

## 2022-08-30 IMAGING — CR DG HIP (WITH OR WITHOUT PELVIS) 2-3V*L*
3 series · 3 of 3 positions shown · non-contrast
Comparison: None.

CLINICAL DATA: Fall, hip pain

EXAM:
DG HIP (WITH OR WITHOUT PELVIS) 2-3V LEFT

[t hip ap left (1 of 2)]
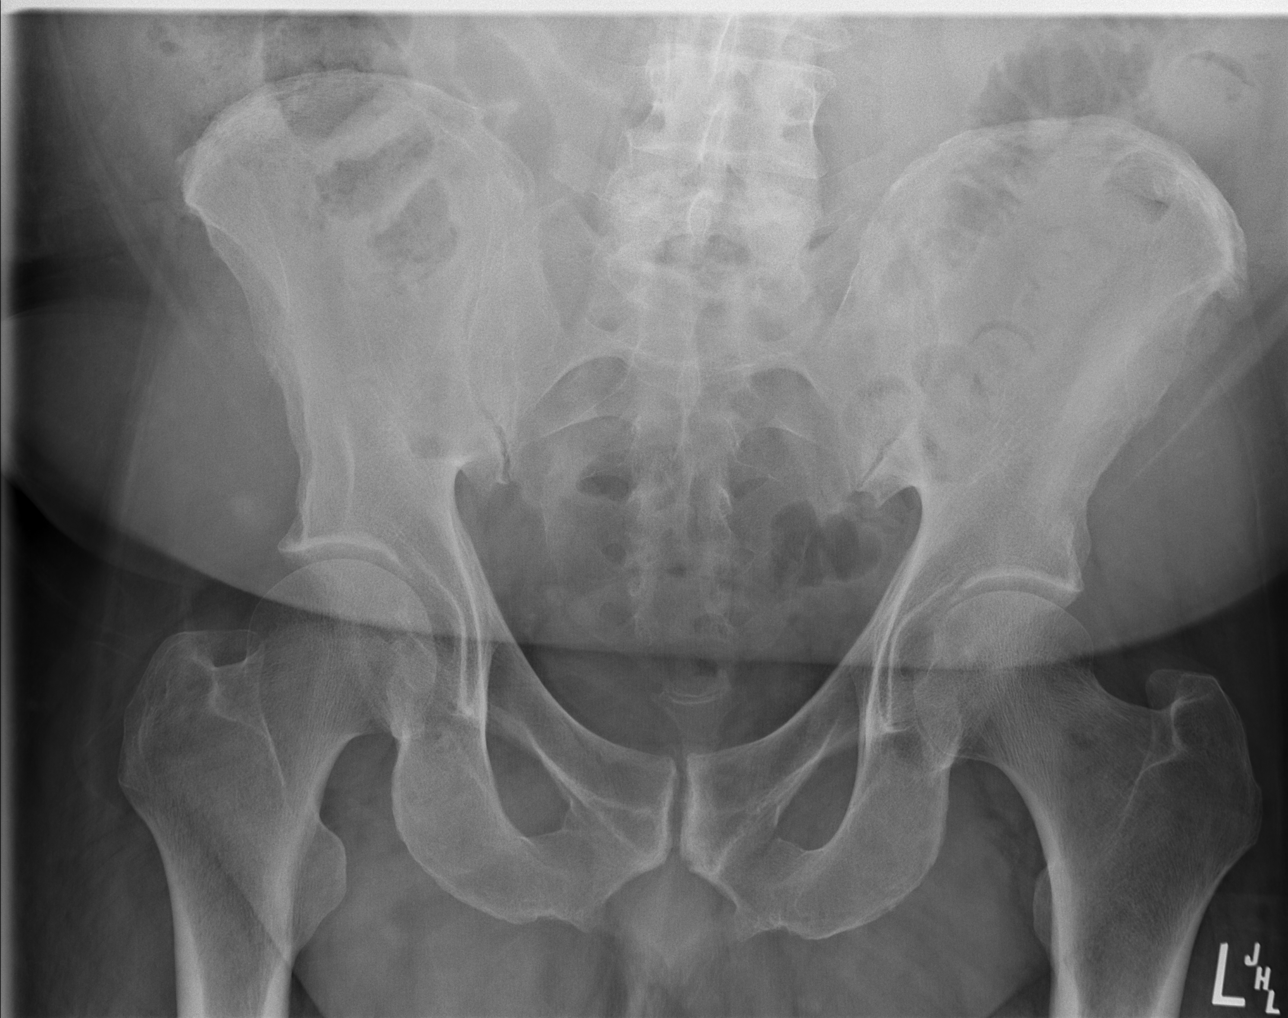

[t hip ap left (2 of 2)]
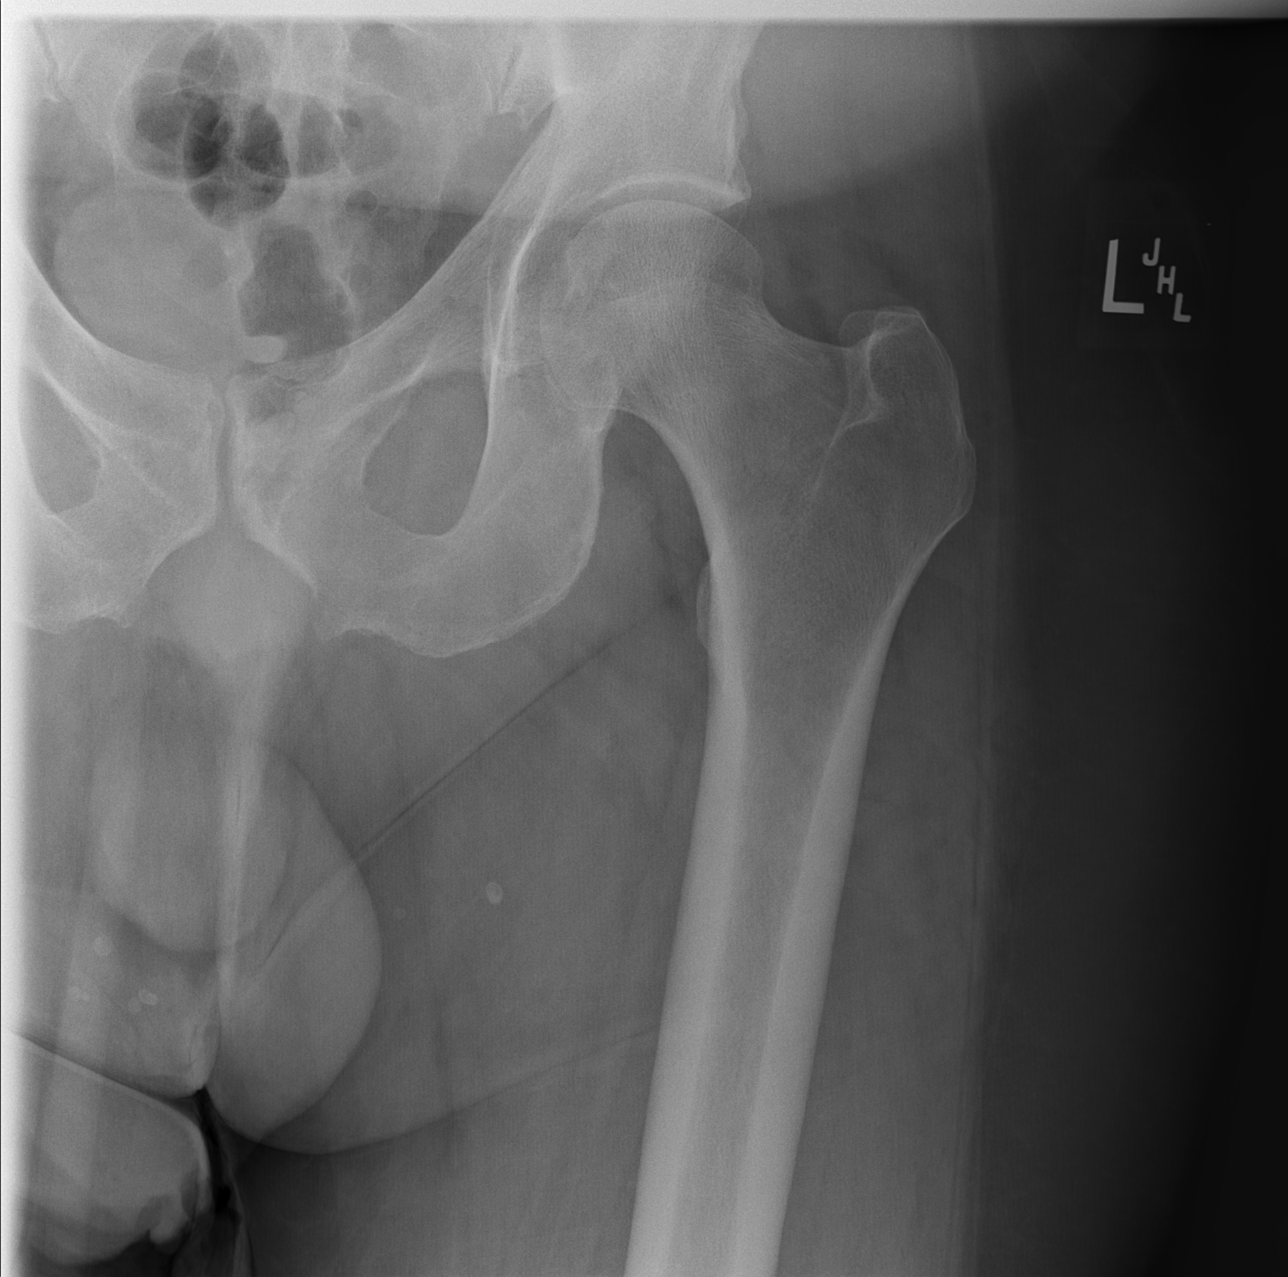

[t hip frog leg left]
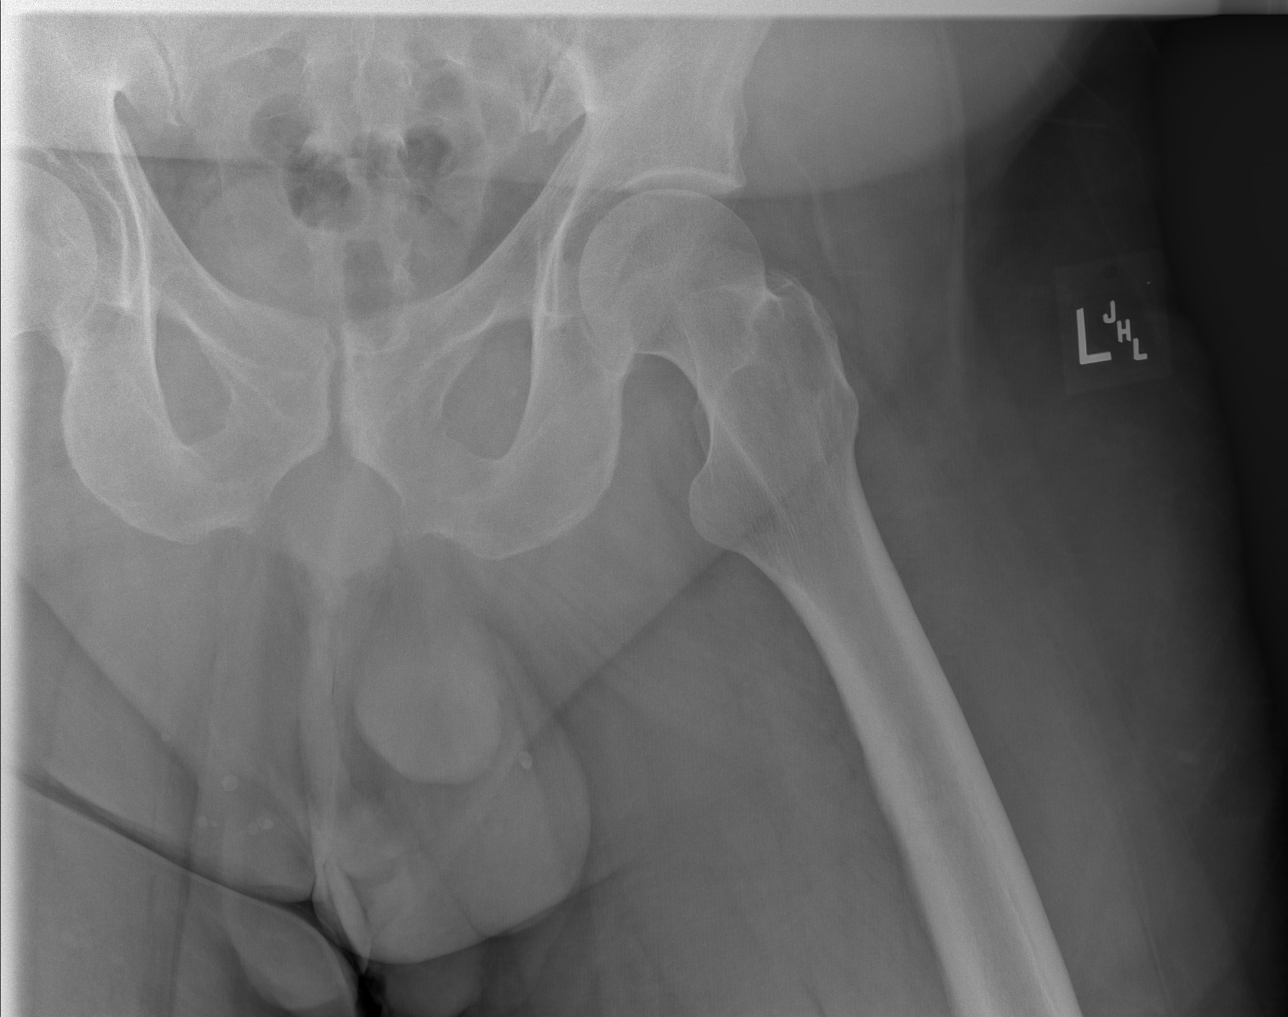

[3 of 3 positions shown; findings below may reference images not displayed]

FINDINGS: There is no evidence of hip fracture or dislocation. There is no
evidence of arthropathy or other focal bone abnormality.
IMPRESSION: No acute osseous abnormality identified.

## 2022-09-05 ENCOUNTER — Other Ambulatory Visit (INDEPENDENT_AMBULATORY_CARE_PROVIDER_SITE_OTHER): Payer: Self-pay | Admitting: Family Medicine

## 2022-09-05 DIAGNOSIS — F3289 Other specified depressive episodes: Secondary | ICD-10-CM

## 2022-09-07 NOTE — Progress Notes (Signed)
HPI: FU atrial fibrillation.  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  He has had previous cardioversions but did not hold sinus rhythm.  Pt is now treated with rate control and anticoagulation as he is asymptomatic. CTA 7/21 showed no pulmonary embolus. Last echocardiogram December 2022 showed normal LV function, severe left atrial enlargement, mild mitral regurgitation.  Since last seen, he denies dyspnea, chest pain, palpitations, syncope or bleeding.  Occasional minimal pedal edema.  Current Outpatient Medications  Medication Sig Dispense Refill   apixaban (ELIQUIS) 5 MG TABS tablet Take 1 tablet (5 mg total) by mouth 2 (two) times daily. 60 tablet 1   buPROPion (WELLBUTRIN XL) 150 MG 24 hr tablet Take 1 tablet (150 mg total) by mouth daily. 90 tablet 0   cholecalciferol (VITAMIN D3) 25 MCG (1000 UNIT) tablet Take 1,000 Units by mouth daily.     diltiazem (CARDIZEM CD) 120 MG 24 hr capsule TAKE 1 CAPSULE BY MOUTH  DAILY 90 capsule 3   furosemide (LASIX) 20 MG tablet TAKE 1 TABLET BY MOUTH  DAILY 30 tablet 11   metoprolol (TOPROL-XL) 200 MG 24 hr tablet TAKE 1 TABLET BY MOUTH  DAILY 90 tablet 3   omeprazole (PRILOSEC OTC) 20 MG tablet Take 20 mg by mouth daily before breakfast.      vitamin B-12 (CYANOCOBALAMIN) 1000 MCG tablet Take 1 tablet (1,000 mcg total) by mouth daily. 90 tablet 0   No current facility-administered medications for this visit.     Past Medical History:  Diagnosis Date   Arthritis    Atrial fibrillation (HCC)    a. s/p multiple DCCV's ---> now rate-controlled. On Eliquis for anticoagulation   Cardiomyopathy (HCC)    a. EF previously at 25-30% (Tachycardia-mediated?) --> at 55-60% by echo in 03/2016   Cellulitis and abscess of right leg    Diabetes mellitus without complication (HCC)    Dysrhythmia    ED (erectile dysfunction)    Edema of knee    Hypertension    Hypogonadism male     Joint pain    Leg cramp    Muscle pain    Obesity    Obesity    OSA (obstructive sleep apnea)    a. on CPAP   Other fatigue    Pre-diabetes    SOB (shortness of breath)    SOB (shortness of breath) on exertion    Vitamin B12 deficiency    Vitamin D deficiency     Past Surgical History:  Procedure Laterality Date   APPENDECTOMY  1981   CARDIOVERSION N/A 03/24/2013   Procedure: CARDIOVERSION;  Surgeon: Lewayne Bunting, MD;  Location: [facility] ENDOSCOPY;  Service: Cardiovascular;  Laterality: N/A;   EYE SURGERY  in 1st grade   Left eye   LAPAROSCOPIC GASTRIC SLEEVE RESECTION  8/13   XI ROBOTIC ASSISTED INGUINAL HERNIA REPAIR WITH MESH Right 12/08/2021   Procedure: XI ROBOTIC REPAIR OF RIGHT INCARCERATED INGUINAL HERNIA WITH MESH;  Surgeon: Gaynelle Adu, MD;  Location: [facility];  Service: General;  Laterality: Right;    Social History   Socioeconomic History   Marital status: Divorced    Spouse name: [name]   Number of children: N   Years of education: Not on file   Highest education level: Not on file  Occupational History   Occupation: Customer service manager: [employer]    Employer: [employer]  Tobacco Use   Smoking status: Never  Smokeless tobacco: Never  Vaping Use   Vaping Use: Never used  Substance and Sexual Activity   Alcohol use: No   Drug use: No   Sexual activity: Yes  Other Topics Concern   Not on file  Social History Narrative   Got married 08/2010      Regular Exercise- yes         Social Determinants of Health   Financial Resource Strain: Not on file  Food Insecurity: Not on file  Transportation Needs: Not on file  Physical Activity: Not on file  Stress: Not on file  Social Connections: Not on file  Intimate Partner Violence: Not on file    Family History  Problem Relation Age of Onset   Heart attack Father 38   Heart disease Father 70   Hypertension Father    Heart failure Father    Sudden death Father    Heart attack Other         cousin   Heart disease Other    Stroke Mother    Obesity Mother     ROS: no fevers or chills, productive cough, hemoptysis, dysphasia, odynophagia, melena, hematochezia, dysuria, hematuria, rash, seizure activity, orthopnea, PND, pedal edema, claudication. Remaining systems are negative.  Physical Exam: Well-developed well-nourished in no acute distress.  Skin is warm and dry.  HEENT is normal.  Neck is supple.  Chest is clear to auscultation with normal expansion.  Cardiovascular exam is irregular Abdominal exam nontender or distended. No masses palpated. Extremities show no edema. neuro grossly intact  ECG-atrial fibrillation at a rate of 69, normal axis, cannot rule out septal infarct.  Personally reviewed  A/P  1 permanent atrial fibrillation-continue Cardizem and Toprol for rate control.  Will continue apixaban.  Check hemoglobin and renal function.  2 hypertension-patient's blood pressure is controlled.  Continue present medications and follow.  3 history of cardiomyopathy-previously felt to be tachycardia mediated.  LV function has normalized on most recent echocardiogram.  4 obstructive sleep apnea-continue CPAP.  5 morbid obesity-we discussed the importance of weight loss.  6 lower extremity edema-controlled.  Continue Lasix at present dose.  Olga Millers, MD

## 2022-09-08 NOTE — Progress Notes (Signed)
Chief Complaint:   OBESITY Dakota Hernandez is here to discuss his progress with his obesity treatment plan along with follow-up of his obesity related diagnoses. Dakota Hernandez is on the Category 4 Plan and states he is following his eating plan approximately 50% of the time. Dakota Hernandez states he is walking 2-3 miles 5 times per week.  Today's visit was #: 22 Starting weight: 382 lbs Starting date: 01/28/2021 Today's weight: 327 lbs Today's date: 08/25/2022 Total lbs lost to date: 55 lbs Total lbs lost since last in-office visit: 4  Interim History: Dakota Hernandez feels like dinner is going well but breakfast and lunch are hard to get back on plan.  For Thanksgiving he and his family are getting together. He is working Wednesday Friday and Saturday.  He has a appointment with Dr. Jens Som coming up.  Subjective:   1. Vitamin D deficiency Dakota Hernandez is not on Prescription vitamin D (on 1K IU/Day). Denies any nausea, vomiting or muscle weakness. He notes fatigue.  Assessment/Plan:   1. Vitamin D deficiency Continue taking over the counter Vit D. Labs last done in June.  2. Obesity, Current BMI is 48.3 Dakota Hernandez is currently in the action stage of change. As such, his goal is to continue with weight loss efforts. He has agreed to the Category 4 Plan.   Exercise goals: All adults should avoid inactivity. Some physical activity is better than none, and adults who participate in any amount of physical activity gain some health benefits.  Behavioral modification strategies: increasing lean protein intake, meal planning and cooking strategies, keeping healthy foods in the home, and planning for success.  Dakota Hernandez has agreed to follow-up with our clinic in 5 weeks. He was informed of the importance of frequent follow-up visits to maximize his success with intensive lifestyle modifications for his multiple health conditions.   Objective:   Blood pressure 126/87, pulse 77, temperature 97.6 F (36.4 C), height 5\' 9"   (1.753 m), weight (!) 327 lb (148.3 kg), SpO2 97 %. Body mass index is 48.29 kg/m.  General: Cooperative, alert, well developed, in no acute distress. HEENT: Conjunctivae and lids unremarkable. Cardiovascular: Regular rhythm.  Lungs: Normal work of breathing. Neurologic: No focal deficits.   Lab Results  Component Value Date   CREATININE 1.11 03/11/2022   BUN 16 03/11/2022   NA 142 03/11/2022   K 4.4 03/11/2022   CL 102 03/11/2022   CO2 25 03/11/2022   Lab Results  Component Value Date   ALT 17 03/11/2022   AST 18 03/11/2022   ALKPHOS 89 03/11/2022   BILITOT 0.8 03/11/2022   Lab Results  Component Value Date   HGBA1C 5.4 03/11/2022   HGBA1C 5.0 11/19/2021   HGBA1C 5.6 06/25/2021   HGBA1C 6.2 (H) 01/28/2021   HGBA1C 6.0 10/30/2020   Lab Results  Component Value Date   INSULIN 13.6 03/11/2022   INSULIN 8.4 06/25/2021   INSULIN 17.7 01/28/2021   Lab Results  Component Value Date   TSH 0.875 01/28/2021   Lab Results  Component Value Date   CHOL 183 03/11/2022   HDL 57 03/11/2022   LDLCALC 112 (H) 03/11/2022   TRIG 73 03/11/2022   CHOLHDL 4.0 CALC 06/14/2008   Lab Results  Component Value Date   VD25OH 65.3 03/11/2022   VD25OH 55.6 06/25/2021   VD25OH 11.0 (L) 01/28/2021   Lab Results  Component Value Date   WBC 4.9 11/19/2021   HGB 15.2 11/19/2021   HCT 45.3 11/19/2021   MCV 89.2 11/19/2021  PLT 174 11/19/2021   No results found for: "IRON", "TIBC", "FERRITIN"  Attestation Statements:   Reviewed by clinician on day of visit: allergies, medications, problem list, medical history, surgical history, family history, social history, and previous encounter notes.  I, Fortino Sic, RMA am acting as transcriptionist for Reuben Likes, MD. I have reviewed the above documentation for accuracy and completeness, and I agree with the above. - Reuben Likes, MD

## 2022-09-15 ENCOUNTER — Ambulatory Visit: Payer: Managed Care, Other (non HMO) | Attending: Cardiology | Admitting: Cardiology

## 2022-09-15 ENCOUNTER — Encounter: Payer: Self-pay | Admitting: Cardiology

## 2022-09-15 VITALS — BP 118/78 | HR 69 | Ht 70.0 in | Wt 324.7 lb

## 2022-09-15 DIAGNOSIS — I420 Dilated cardiomyopathy: Secondary | ICD-10-CM

## 2022-09-15 NOTE — Patient Instructions (Signed)
  Follow-Up: At [facility], you and your health needs are our priority.  As part of our continuing mission to provide you with exceptional heart care, we have created designated Provider Care Teams.  These Care Teams include your primary Cardiologist (physician) and Advanced Practice Providers (APPs -  Physician Assistants and Nurse Practitioners) who all work together to provide you with the care you need, when you need it.  We recommend signing up for the patient portal called "MyChart".  Sign up information is provided on this After Visit Summary.  MyChart is used to connect with patients for Virtual Visits (Telemedicine).  Patients are able to view lab/test results, encounter notes, upcoming appointments, etc.  Non-urgent messages can be sent to your provider as well.   To learn more about what you can do with MyChart, go to [email or web address].    Your next appointment:   12 month(s)  The format for your next appointment:   In Person  Provider:   Brian Crenshaw, MD   

## 2022-09-16 LAB — CBC
Hematocrit: 43 % (ref 37.5–51.0)
Hemoglobin: 13.3 g/dL (ref 13.0–17.7)
MCH: 27.6 pg (ref 26.6–33.0)
MCHC: 30.9 g/dL — ABNORMAL LOW (ref 31.5–35.7)
MCV: 89 fL (ref 79–97)
Platelets: 197 10*3/uL (ref 150–450)
RBC: 4.82 x10E6/uL (ref 4.14–5.80)
RDW: 14.3 % (ref 11.6–15.4)
WBC: 5.4 10*3/uL (ref 3.4–10.8)

## 2022-09-16 LAB — BASIC METABOLIC PANEL
BUN/Creatinine Ratio: 18 (ref 9–20)
BUN: 21 mg/dL (ref 6–24)
CO2: 25 mmol/L (ref 20–29)
Calcium: 8.9 mg/dL (ref 8.7–10.2)
Chloride: 109 mmol/L — ABNORMAL HIGH (ref 96–106)
Creatinine, Ser: 1.19 mg/dL (ref 0.76–1.27)
Glucose: 75 mg/dL (ref 70–99)
Potassium: 4.7 mmol/L (ref 3.5–5.2)
Sodium: 146 mmol/L — ABNORMAL HIGH (ref 134–144)
eGFR: 72 mL/min/{1.73_m2} (ref >59)

## 2022-09-18 ENCOUNTER — Encounter: Payer: Self-pay | Admitting: Cardiology

## 2022-09-18 DIAGNOSIS — R601 Generalized edema: Secondary | ICD-10-CM

## 2022-09-18 DIAGNOSIS — G4733 Obstructive sleep apnea (adult) (pediatric): Secondary | ICD-10-CM

## 2022-09-18 DIAGNOSIS — I4891 Unspecified atrial fibrillation: Secondary | ICD-10-CM

## 2022-09-18 DIAGNOSIS — I4819 Other persistent atrial fibrillation: Secondary | ICD-10-CM

## 2022-09-18 MED ORDER — METOPROLOL SUCCINATE ER 200 MG PO TB24: 200.0000 mg | ORAL_TABLET | Freq: Every day | ORAL | 3 refills | Status: DC

## 2022-09-18 MED ORDER — APIXABAN 5 MG PO TABS: 5.0000 mg | ORAL_TABLET | Freq: Two times a day (BID) | ORAL | 1 refills | Status: DC

## 2022-09-18 MED ORDER — FUROSEMIDE 20 MG PO TABS: 20.0000 mg | ORAL_TABLET | Freq: Every day | ORAL | 3 refills | Status: DC

## 2022-09-18 MED ORDER — DILTIAZEM HCL ER COATED BEADS 120 MG PO CP24: 120.0000 mg | ORAL_CAPSULE | Freq: Every day | ORAL | 3 refills | Status: DC

## 2022-09-21 ENCOUNTER — Encounter: Payer: Self-pay | Admitting: *Deleted

## 2022-09-22 ENCOUNTER — Telehealth: Payer: Self-pay | Admitting: Cardiology

## 2022-09-22 DIAGNOSIS — R601 Generalized edema: Secondary | ICD-10-CM

## 2022-09-22 DIAGNOSIS — I4819 Other persistent atrial fibrillation: Secondary | ICD-10-CM

## 2022-09-22 DIAGNOSIS — I4891 Unspecified atrial fibrillation: Secondary | ICD-10-CM

## 2022-09-22 DIAGNOSIS — G4733 Obstructive sleep apnea (adult) (pediatric): Secondary | ICD-10-CM

## 2022-09-22 NOTE — Telephone Encounter (Signed)
 *  STAT* If patient is at the pharmacy, call can be transferred to refill team.   1. Which medications need to be refilled? (please list name of each medication and dose if known)   diltiazem (CARDIZEM CD) 120 MG 24 hr capsule    furosemide (LASIX) 20 MG tablet   metoprolol (TOPROL-XL) 200 MG 24 hr tablet   2. Which pharmacy/location (including street and city if local pharmacy) is medication to be sent to? [id number] - [place], [place] - [address]    3. Do they need a 30 day or 90 day supply? 30 days  Per [facility] they are having issue with refilling the medication and would like to have the pt's refill sent to his local pharmacy for the mean time for 30 days supply

## 2022-09-23 MED ORDER — FUROSEMIDE 20 MG PO TABS: 20.0000 mg | ORAL_TABLET | Freq: Every day | ORAL | 3 refills | Status: DC

## 2022-09-23 MED ORDER — DILTIAZEM HCL ER COATED BEADS 120 MG PO CP24: 120.0000 mg | ORAL_CAPSULE | Freq: Every day | ORAL | 3 refills | Status: DC

## 2022-09-23 MED ORDER — METOPROLOL SUCCINATE ER 200 MG PO TB24: 200.0000 mg | ORAL_TABLET | Freq: Every day | ORAL | 3 refills | Status: DC

## 2022-10-06 ENCOUNTER — Encounter (INDEPENDENT_AMBULATORY_CARE_PROVIDER_SITE_OTHER): Payer: Self-pay | Admitting: Family Medicine

## 2022-10-06 ENCOUNTER — Ambulatory Visit (INDEPENDENT_AMBULATORY_CARE_PROVIDER_SITE_OTHER): Payer: Managed Care, Other (non HMO) | Admitting: Family Medicine

## 2022-10-06 VITALS — BP 115/78 | HR 76 | Temp 97.8°F | Ht 70.0 in | Wt 320.0 lb

## 2022-10-06 DIAGNOSIS — Z6841 Body Mass Index (BMI) 40.0 and over, adult: Secondary | ICD-10-CM | POA: Diagnosis not present

## 2022-10-06 DIAGNOSIS — F3289 Other specified depressive episodes: Secondary | ICD-10-CM

## 2022-10-06 DIAGNOSIS — E7849 Other hyperlipidemia: Secondary | ICD-10-CM | POA: Diagnosis not present

## 2022-10-06 DIAGNOSIS — E669 Obesity, unspecified: Secondary | ICD-10-CM

## 2022-10-06 MED ORDER — BUPROPION HCL ER (XL) 150 MG PO TB24: 150.0000 mg | ORAL_TABLET | Freq: Every day | ORAL | 0 refills | Status: DC

## 2022-10-16 NOTE — Progress Notes (Signed)
Chief Complaint:   OBESITY Melody is here to discuss his progress with his obesity treatment plan along with follow-up of his obesity related diagnoses. Garland is on the Category 4 Plan and states he is following his eating plan approximately 50% of the time. Xayvier states he is walking 2-3 miles 5 times per week.  Today's visit was #: 23 Starting weight: 382 lbs Starting date: 01/28/2021 Today's weight: 320 lbs Today's date: 10/06/2022 Total lbs lost to date: 62 lbs Total lbs lost since last in-office visit: 7  Interim History: This is Nymir's first appointment since 08/25/2022.  Off Christmas and New Year's.  Still able to follow plan at 50% of the time.  Dinner tends to be easier to follow and breakfast and lunch tends to still be difficult to follow.  Saw Dr. Stanford Breed between appointments.  Snacking a bit more.  Subjective:   1. Other hyperlipidemia Leib last LDL was slightly elevated.  He is not on medication.  2. Other depression, emotional eating behaviors Jonuel was on Wellbutrin previously, has noted an increase in snacking since being off medication.  Assessment/Plan:   1. Other hyperlipidemia Will obtain labs at next appointment.  2. Other depression, emotional eating behaviors We will refill Wellbutrin XL 150 mg daily for 3 months with 0 refills.  -Refill buPROPion (WELLBUTRIN XL) 150 MG 24 hr tablet; Take 1 tablet (150 mg total) by mouth daily.  Dispense: 90 tablet; Refill: 0  3. Obesity, Current BMI is 47.3 Graesyn is currently in the action stage of change. As such, his goal is to continue with weight loss efforts. He has agreed to the Category 4 Plan.   Exercise goals: All adults should avoid inactivity. Some physical activity is better than none, and adults who participate in any amount of physical activity gain some health benefits.  Behavioral modification strategies: increasing lean protein intake, meal planning and cooking strategies, keeping healthy  foods in the home, and planning for success.  Helmut has agreed to follow-up with our clinic in 5 weeks. He was informed of the importance of frequent follow-up visits to maximize his success with intensive lifestyle modifications for his multiple health conditions.   Objective:   Blood pressure 115/78, pulse 76, temperature 97.8 F (36.6 C), height 5\' 10"  (1.778 m), weight (!) 320 lb (145.2 kg), SpO2 95 %. Body mass index is 45.92 kg/m.  General: Cooperative, alert, well developed, in no acute distress. HEENT: Conjunctivae and lids unremarkable. Cardiovascular: Regular rhythm.  Lungs: Normal work of breathing. Neurologic: No focal deficits.   Lab Results  Component Value Date   CREATININE 1.19 09/15/2022   BUN 21 09/15/2022   NA 146 (H) 09/15/2022   K 4.7 09/15/2022   CL 109 (H) 09/15/2022   CO2 25 09/15/2022   Lab Results  Component Value Date   ALT 17 03/11/2022   AST 18 03/11/2022   ALKPHOS 89 03/11/2022   BILITOT 0.8 03/11/2022   Lab Results  Component Value Date   HGBA1C 5.4 03/11/2022   HGBA1C 5.0 11/19/2021   HGBA1C 5.6 06/25/2021   HGBA1C 6.2 (H) 01/28/2021   HGBA1C 6.0 10/30/2020   Lab Results  Component Value Date   INSULIN 13.6 03/11/2022   INSULIN 8.4 06/25/2021   INSULIN 17.7 01/28/2021   Lab Results  Component Value Date   TSH 0.875 01/28/2021   Lab Results  Component Value Date   CHOL 183 03/11/2022   HDL 57 03/11/2022   LDLCALC 112 (H) 03/11/2022  TRIG 73 03/11/2022   CHOLHDL 4.0 CALC 06/14/2008   Lab Results  Component Value Date   VD25OH 65.3 03/11/2022   VD25OH 55.6 06/25/2021   VD25OH 11.0 (L) 01/28/2021   Lab Results  Component Value Date   WBC 5.4 09/15/2022   HGB 13.3 09/15/2022   HCT 43.0 09/15/2022   MCV 89 09/15/2022   PLT 197 09/15/2022   No results found for: "IRON", "TIBC", "FERRITIN"  Attestation Statements:   Reviewed by clinician on day of visit: allergies, medications, problem list, medical history,  surgical history, family history, social history, and previous encounter notes.  I, Elnora Morrison, RMA am acting as transcriptionist for Coralie Common, MD.  I have reviewed the above documentation for accuracy and completeness, and I agree with the above. - Coralie Common, MD

## 2022-11-10 ENCOUNTER — Encounter (INDEPENDENT_AMBULATORY_CARE_PROVIDER_SITE_OTHER): Payer: Self-pay | Admitting: Family Medicine

## 2022-11-10 ENCOUNTER — Ambulatory Visit (INDEPENDENT_AMBULATORY_CARE_PROVIDER_SITE_OTHER): Payer: Managed Care, Other (non HMO) | Admitting: Family Medicine

## 2022-11-10 VITALS — BP 154/100 | HR 86 | Temp 97.7°F | Ht 70.0 in | Wt 325.0 lb

## 2022-11-10 DIAGNOSIS — R7303 Prediabetes: Secondary | ICD-10-CM

## 2022-11-10 DIAGNOSIS — Z6841 Body Mass Index (BMI) 40.0 and over, adult: Secondary | ICD-10-CM

## 2022-11-10 DIAGNOSIS — I10 Essential (primary) hypertension: Secondary | ICD-10-CM

## 2022-11-10 DIAGNOSIS — E669 Obesity, unspecified: Secondary | ICD-10-CM

## 2022-11-10 DIAGNOSIS — E7849 Other hyperlipidemia: Secondary | ICD-10-CM | POA: Diagnosis not present

## 2022-11-10 DIAGNOSIS — E559 Vitamin D deficiency, unspecified: Secondary | ICD-10-CM | POA: Diagnosis not present

## 2022-11-10 DIAGNOSIS — E538 Deficiency of other specified B group vitamins: Secondary | ICD-10-CM

## 2022-11-10 NOTE — Progress Notes (Deleted)
Patient says dinner is normally on plan.  Lunch is still eating out and he recognizes that he still may not be getting the most nutritious options.  Breakfast is still a struggle as well and he is eating out at [place].  He realizes that he needs to get up earlier in the a in order to have breakfast or lunch prepared and on plan.  His new job is going well. He has the eating out options handout.  [employer] hot dogs are creeping back into his dietary routine. Today is his day off.  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  Denies any upcoming activities, events or travel.

## 2022-11-11 LAB — COMPREHENSIVE METABOLIC PANEL
ALT: 11 IU/L (ref 0–44)
AST: 15 IU/L (ref 0–40)
Albumin/Globulin Ratio: 1.8 (ref 1.2–2.2)
Albumin: 4 g/dL (ref 3.8–4.9)
Alkaline Phosphatase: 82 IU/L (ref 44–121)
BUN/Creatinine Ratio: 16 (ref 9–20)
BUN: 18 mg/dL (ref 6–24)
Bilirubin Total: 0.7 mg/dL (ref 0.0–1.2)
CO2: 23 mmol/L (ref 20–29)
Calcium: 8.7 mg/dL (ref 8.7–10.2)
Chloride: 109 mmol/L — ABNORMAL HIGH (ref 96–106)
Creatinine, Ser: 1.11 mg/dL (ref 0.76–1.27)
Globulin, Total: 2.2 g/dL (ref 1.5–4.5)
Glucose: 95 mg/dL (ref 70–99)
Potassium: 4.3 mmol/L (ref 3.5–5.2)
Sodium: 144 mmol/L (ref 134–144)
Total Protein: 6.2 g/dL (ref 6.0–8.5)
eGFR: 78 mL/min/{1.73_m2} (ref >59)

## 2022-11-11 LAB — LIPID PANEL WITH LDL/HDL RATIO
Cholesterol, Total: 168 mg/dL (ref 100–199)
HDL: 49 mg/dL (ref >39)
LDL Chol Calc (NIH): 107 mg/dL — ABNORMAL HIGH (ref 0–99)
LDL/HDL Ratio: 2.2 ratio (ref 0.0–3.6)
Triglycerides: 61 mg/dL (ref 0–149)
VLDL Cholesterol Cal: 12 mg/dL (ref 5–40)

## 2022-11-11 LAB — INSULIN, RANDOM: INSULIN: 10.3 u[IU]/mL (ref 2.6–24.9)

## 2022-11-11 LAB — HEMOGLOBIN A1C
Est. average glucose Bld gHb Est-mCnc: 123 mg/dL
Hgb A1c MFr Bld: 5.9 % — ABNORMAL HIGH (ref 4.8–5.6)

## 2022-11-11 LAB — VITAMIN D 25 HYDROXY (VIT D DEFICIENCY, FRACTURES): Vit D, 25-Hydroxy: 64.3 ng/mL (ref 30.0–100.0)

## 2022-11-11 LAB — VITAMIN B12: Vitamin B-12: 1259 pg/mL — ABNORMAL HIGH (ref 232–1245)

## 2022-11-18 NOTE — Progress Notes (Signed)
Chief Complaint:   OBESITY Dakota Hernandez is here to discuss his progress with his obesity treatment plan along with follow-up of his obesity related diagnoses. Dakota Hernandez is on the Category 4 Plan and states he is following his eating plan approximately 60% of the time. Dakota Hernandez states he is exercising 0 minutes 0 times per week.  Today's visit was #: 24 Starting weight: 382 lbs Starting date: 01/28/2021 Today's weight: 325 lbs Today's date: 11/09/2022 Total lbs lost to date: 57 lbs Total lbs lost since last in-office visit: 0  Interim History: Patient says dinner is normally on plan. Lunch is still eating out and he recognizes that he still may not be getting the most nutritious options. Breakfast is still a struggle as well and he is eating out at [employer]. He realizes that he needs to get up earlier in the a in order to have breakfast or lunch prepared and on plan. His new job is going well. He has the eating out options handout. Mentions hot dogs are creeping back into his dietary routine. Today is his day off. 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 Denies any upcoming activities, events or travel.   Subjective:   1. Essential hypertension Blood pressure elevated today.  Denies chest pain, chest pressure and headache.  On Cardizem and Metroprolol.  Dakota Hernandez voices he just took medication prior to appointment.  2. Prediabetes Last A1c better controlled.  Insulin still elevated.  Not on medications.  3. Other hyperlipidemia Not on medications.  Last LDL elevated.  4. Vitamin D deficiency On 1k IU daily.  Notes fatigue.  5. Vitamin B12 deficiency On B12 by mouth.  Last level within normal limits.  Notes fatigue.  Assessment/Plan:   1. Essential hypertension We will obtain labs today.  2. Prediabetes We will obtain labs today.  - Comprehensive metabolic panel - Hemoglobin A1c - Insulin, random  3. Other hyperlipidemia We will obtain labs today.  - Lipid Panel  With LDL/HDL Ratio  4. Vitamin D deficiency We will obtain labs today.  - VITAMIN D 25 Hydroxy (Vit-D Deficiency, Fractures)  5. Vitamin B12 deficiency We will obtain labs today.  - Vitamin B12  6. BMI 45.0-49.9, adult ([place])  7. Obesity with current BMI of 56.4 Dakota Hernandez is currently in the action stage of change. As such, his goal is to continue with weight loss efforts. He has agreed to the Category 4 Plan.   Exercise goals: No exercise has been prescribed at this time.  Behavioral modification strategies: increasing lean protein intake, decreasing eating out, meal planning and cooking strategies, and planning for success.  Dakota Hernandez has agreed to follow-up with our clinic in 4 weeks. He was informed of the importance of frequent follow-up visits to maximize his success with intensive lifestyle modifications for his multiple health conditions.   Dakota Hernandez was informed we would discuss his lab results at his next visit unless there is a critical issue that needs to be addressed sooner. Dakota Hernandez agreed to keep his next visit at the agreed upon time to discuss these results.  Objective:   Blood pressure (!) 154/100, pulse 86, temperature 97.7 F (36.5 C), height '5\' 10"'$  (1.778 m), weight (!) 325 lb (147.4 kg), SpO2 98 %. Body mass index is 46.63 kg/m.  General: Cooperative, alert, well developed, in no acute distress. HEENT: Conjunctivae and lids unremarkable. Cardiovascular: Regular rhythm.  Lungs: Normal work of breathing. Neurologic: No focal deficits.   Lab Results  Component  Value Date   CREATININE 1.11 11/10/2022   BUN 18 11/10/2022   NA 144 11/10/2022   K 4.3 11/10/2022   CL 109 (H) 11/10/2022   CO2 23 11/10/2022   Lab Results  Component Value Date   ALT 11 11/10/2022   AST 15 11/10/2022   ALKPHOS 82 11/10/2022   BILITOT 0.7 11/10/2022   Lab Results  Component Value Date   HGBA1C 5.9 (H) 11/10/2022   HGBA1C 5.4 03/11/2022   HGBA1C 5.0 11/19/2021   HGBA1C 5.6  06/25/2021   HGBA1C 6.2 (H) 01/28/2021   Lab Results  Component Value Date   INSULIN 10.3 11/10/2022   INSULIN 13.6 03/11/2022   INSULIN 8.4 06/25/2021   INSULIN 17.7 01/28/2021   Lab Results  Component Value Date   TSH 0.875 01/28/2021   Lab Results  Component Value Date   CHOL 168 11/10/2022   HDL 49 11/10/2022   LDLCALC 107 (H) 11/10/2022   TRIG 61 11/10/2022   CHOLHDL 4.0 CALC 06/14/2008   Lab Results  Component Value Date   VD25OH 64.3 11/10/2022   VD25OH 65.3 03/11/2022   VD25OH 55.6 06/25/2021   Lab Results  Component Value Date   WBC 5.4 09/15/2022   HGB 13.3 09/15/2022   HCT 43.0 09/15/2022   MCV 89 09/15/2022   PLT 197 09/15/2022   No results found for: "IRON", "TIBC", "FERRITIN"  Attestation Statements:   Reviewed by clinician on day of visit: allergies, medications, problem list, medical history, surgical history, family history, social history, and previous encounter notes.  I, Elnora Morrison, RMA am acting as transcriptionist for Coralie Common, MD. I have reviewed the above documentation for accuracy and completeness, and I agree with the above. - Coralie Common, MD

## 2022-11-19 ENCOUNTER — Other Ambulatory Visit: Payer: Self-pay | Admitting: *Deleted

## 2022-11-19 DIAGNOSIS — I4891 Unspecified atrial fibrillation: Secondary | ICD-10-CM

## 2022-11-19 MED ORDER — APIXABAN 5 MG PO TABS: 5.0000 mg | ORAL_TABLET | Freq: Two times a day (BID) | ORAL | 5 refills | Status: DC

## 2022-11-19 NOTE — Telephone Encounter (Signed)
Eliquis 82m refill request received. Patient is 57years old, weight-147.4kg, Crea-1.11 on 11/10/22, Diagnosis-Afib, and last seen by Dr. CStanford Breedon 09/15/22. Dose is appropriate based on dosing criteria. Will send in refill to requested pharmacy.

## 2022-11-28 NOTE — Progress Notes (Deleted)
HPI Never smoker followed for OSA, complicated by Chronic Venous Insufficiency, Permanent AFib/ Eliquis, HTN, CM/CHF, DM, Morbid Obesity NPSG 05/06/20- AHI 22.4/ hr, desaturation to 67%,  CPAP to 12, body weight 366 lbs  ======================================================   05/28/22- 54 yoM never smoker followed for OSA, complicated by Chronic Venous Insufficiency, Permanent AFib/ Eliquis, HTN, CM/CHF, DM, Morbid Obesity, Cellulitis R leg,  CPAP 5-15/ [facility]                                        -new order 07/23/20    AirSense 11 AutoSet Download- compliance 53%, AHI 0.4/ hr Body weight today- 336 lbs Covid vax- He lost his job when [employer] closed and has been substantially stressed seeking work.  This is clearly impacting his sleep. Download reviewed.  He sleeps better when he uses his machine.  Comfort issues reviewed.  226/24- 65 yoM never smoker followed for OSA, complicated by Chronic Venous Insufficiency, Permanent AFib/ Eliquis, HTN, CM/CHF, DM, Morbid Obesity, Cellulitis R leg,  CPAP 5-15/ [facility]                                        -new order 07/23/20    AirSense 11 AutoSet Download- compliance  Body weight today-  Covid vax-   ROS-see HPI   + = positive Constitutional:    weight loss, night sweats, fevers, chills, fatigue, lassitude. HEENT:    headaches, difficulty swallowing, tooth/dental problems, sore throat,       sneezing, itching, ear ache, nasal congestion, post nasal drip, snoring CV:    chest pain, orthopnea, PND, +swelling in lower extremities, anasarca,                                  dizziness, +palpitations Resp:   +shortness of breath with exertion or at rest.                productive cough,   non-productive cough, coughing up of blood.              change in color of mucus.  wheezing.   Skin:    rash or lesions. GI:  No-   heartburn, indigestion, abdominal pain, nausea, vomiting, diarrhea,                 change in bowel habits, loss of appetite GU: dysuria,  change in color of urine, no urgency or frequency.   flank pain. MS:   joint pain, stiffness, decreased range of motion, back pain. Neuro-     nothing unusual Psych:  change in mood or affect.  depression or anxiety.   memory loss.  OBJ- Physical Exam General- Alert, Oriented, Affect-appropriate, Distress- none acute,  + morbidly obese Skin- rash-none, lesions- none, excoriation- none Lymphadenopathy- none Head- atraumatic            Eyes- Gross vision intact, PERRLA, conjunctivae and secretions clear            Ears- Hearing, canals-normal            Nose- Clear, no-Septal dev, mucus, polyps, erosion, perforation             Throat- Mallampati III_IV , mucosa clear , drainage- none, tonsils- atrophic,  + teeth  Neck- flexible , trachea midline, no stridor , thyroid nl, carotid no bruit Chest - symmetrical excursion , unlabored           Heart/CV- +IRR/ AFib , no murmur , no gallop  , no rub, nl s1 s2                           - JVD+1 , edema+tight 4+, stasis changes- none, varices- none           Lung- clear to P&A, wheeze- none, cough- none , dullness-none, rub- none           Chest wall-  Abd-  Br/ Gen/ Rectal- Not done, not indicated Extrem- cyanosis- none, clubbing, none, atrophy- none, strength- nl Neuro- grossly intact to observation

## 2022-11-30 ENCOUNTER — Ambulatory Visit: Payer: BC Managed Care – PPO | Admitting: Internal Medicine

## 2022-12-08 ENCOUNTER — Encounter (INDEPENDENT_AMBULATORY_CARE_PROVIDER_SITE_OTHER): Payer: Self-pay | Admitting: Family Medicine

## 2022-12-08 ENCOUNTER — Ambulatory Visit (INDEPENDENT_AMBULATORY_CARE_PROVIDER_SITE_OTHER): Payer: Managed Care, Other (non HMO) | Admitting: Family Medicine

## 2022-12-08 VITALS — BP 138/94 | HR 85 | Temp 97.5°F | Ht 70.0 in | Wt 323.0 lb

## 2022-12-08 DIAGNOSIS — E669 Obesity, unspecified: Secondary | ICD-10-CM

## 2022-12-08 DIAGNOSIS — F3289 Other specified depressive episodes: Secondary | ICD-10-CM

## 2022-12-08 DIAGNOSIS — Z6841 Body Mass Index (BMI) 40.0 and over, adult: Secondary | ICD-10-CM | POA: Diagnosis not present

## 2022-12-08 NOTE — Progress Notes (Deleted)
Patient has been working and been more on plan for lunch and dinner.  Breakfast is still an issue.  He is snacking more in the evening while watching tv. Snacks typically include a pack of crackers, potato chips.  He has gotten away from having fruit on hand.  For breakfast he is doing something on the way to work like chick fil a or [employer].  He wants to use his next two days off to refocus on getting snacks that are more nutritious in the house.  No upcoming plans for events or travel.

## 2022-12-16 NOTE — Progress Notes (Signed)
Chief Complaint:   OBESITY Dakota Hernandez is here to discuss his progress with his obesity treatment plan along with follow-up of his obesity related diagnoses. Dakota Hernandez is on the Category 4 Plan and states he is following his eating plan approximately 65-70% of the time. Dakota Hernandez states he is walking at work.  Today's visit was #: 25 Starting weight: 382 LBS Starting date: 01/28/2021 Today's weight: 323 LBS Today's date: 12/08/2022 Total lbs lost to date: 61 LBS Total lbs lost since last in-office visit: 2 LBS  Interim History:  Patient has been working and been more on plan for lunch and dinner.  Breakfast is still an issue.  He is snacking more in the evening while watching tv. Snacks typically include a pack of crackers, potato chips.  He has gotten away from having fruit on hand.  For breakfast he is doing something on the way to work like chick fil a or [employer].  He wants to use his next two days off to refocus on getting snacks that are more nutritious in the house.  No upcoming plans for events or travel.   Subjective:   1. Other depression with emotional eating Patient is on Wellbutrin daily.  Patient denies suicidal or homicidal ideations.  Assessment/Plan:   1. Other depression with emotional eating Continue Wellbutrin, no change in treatment or dose.  2. BMI 45.0-49.9, adult (Dakota Hernandez)  3. Obesity with starting BMI of 56.4 Dakota Hernandez is currently in the action stage of change. As such, his goal is to continue with weight loss efforts. He has agreed to the Category 4 Plan.  Look into nutrition for breakfast eating out options and look for alternative snacks that are low in calories and are increased in quantity.  Exercise goals: All adults should avoid inactivity. Some physical activity is better than none, and adults who participate in any amount of physical activity gain some health benefits.  Behavioral modification strategies: increasing lean protein intake, increasing water  intake, decreasing eating out, meal planning and cooking strategies, and planning for success.  Dakota Hernandez has agreed to follow-up with our clinic in 4-5 weeks. He was informed of the importance of frequent follow-up visits to maximize his success with intensive lifestyle modifications for his multiple health conditions.   Objective:   Blood pressure (!) 138/94, pulse 85, temperature (!) 97.5 F (36.4 C), height '5\' 10"'$  (1.778 m), weight (!) 323 lb (146.5 kg), SpO2 97 %. Body mass index is 46.35 kg/m.  General: Cooperative, alert, well developed, in no acute distress. HEENT: Conjunctivae and lids unremarkable. Cardiovascular: Regular rhythm.  Lungs: Normal work of breathing. Neurologic: No focal deficits.   Lab Results  Component Value Date   CREATININE 1.11 11/10/2022   BUN 18 11/10/2022   NA 144 11/10/2022   K 4.3 11/10/2022   CL 109 (H) 11/10/2022   CO2 23 11/10/2022   Lab Results  Component Value Date   ALT 11 11/10/2022   AST 15 11/10/2022   ALKPHOS 82 11/10/2022   BILITOT 0.7 11/10/2022   Lab Results  Component Value Date   HGBA1C 5.9 (H) 11/10/2022   HGBA1C 5.4 03/11/2022   HGBA1C 5.0 11/19/2021   HGBA1C 5.6 06/25/2021   HGBA1C 6.2 (H) 01/28/2021   Lab Results  Component Value Date   INSULIN 10.3 11/10/2022   INSULIN 13.6 03/11/2022   INSULIN 8.4 06/25/2021   INSULIN 17.7 01/28/2021   Lab Results  Component Value Date   TSH 0.875 01/28/2021   Lab Results  Component Value Date   CHOL 168 11/10/2022   HDL 49 11/10/2022   LDLCALC 107 (H) 11/10/2022   TRIG 61 11/10/2022   CHOLHDL 4.0 CALC 06/14/2008   Lab Results  Component Value Date   VD25OH 64.3 11/10/2022   VD25OH 65.3 03/11/2022   VD25OH 55.6 06/25/2021   Lab Results  Component Value Date   WBC 5.4 09/15/2022   HGB 13.3 09/15/2022   HCT 43.0 09/15/2022   MCV 89 09/15/2022   PLT 197 09/15/2022   No results found for: "IRON", "TIBC", "FERRITIN"  Attestation Statements:   Reviewed by  clinician on day of visit: allergies, medications, problem list, medical history, surgical history, family history, social history, and previous encounter notes.  I, Davy Pique, RMA, am acting as transcriptionist for Coralie Common, MD.  I have reviewed the above documentation for accuracy and completeness, and I agree with the above. - Coralie Common, MD

## 2023-01-02 ENCOUNTER — Other Ambulatory Visit (INDEPENDENT_AMBULATORY_CARE_PROVIDER_SITE_OTHER): Payer: Self-pay | Admitting: Family Medicine

## 2023-01-02 DIAGNOSIS — F3289 Other specified depressive episodes: Secondary | ICD-10-CM

## 2023-01-09 NOTE — Progress Notes (Deleted)
HPI Never smoker followed for OSA, complicated by Chronic Venous Insufficiency, Permanent AFib/ Eliquis, HTN, CM/CHF, DM, Morbid Obesity NPSG 05/06/20- AHI 22.4/ hr, desaturation to 67%,  CPAP to 12, body weight 366 lbs  ======================================================    05/28/22- 55 yoM never smoker followed for OSA, complicated by Chronic Venous Insufficiency, Permanent AFib/ Eliquis, HTN, CM/CHF, DM, Morbid Obesity, Cellulitis R leg,  CPAP 5-15/ [facility]                                        -new order 07/23/20    AirSense 11 AutoSet Download- compliance 53%, AHI 0.4/ hr Body weight today- 336 lbs Covid vax- He lost his job when [employer] closed and has been substantially stressed seeking work.  This is clearly impacting his sleep. Download reviewed.  He sleeps better when he uses his machine.  Comfort issues reviewed.  01/11/23- 56 yoM never smoker followed for OSA, complicated by Chronic Venous Insufficiency, Permanent AFib/ Eliquis, HTN, CM/CHF, DM, Morbid Obesity, Cellulitis R leg,  CPAP 5-15/ [facility]                                        -new order 07/23/20    AirSense 11 AutoSet Download- compliance Body weight today-     ROS-see HPI   + = positive Constitutional:    weight loss, night sweats, fevers, chills, fatigue, lassitude. HEENT:    headaches, difficulty swallowing, tooth/dental problems, sore throat,       sneezing, itching, ear ache, nasal congestion, post nasal drip, snoring CV:    chest pain, orthopnea, PND, +swelling in lower extremities, anasarca,                                   dizziness, +palpitations Resp:   +shortness of breath with exertion or at rest.                productive cough,   non-productive cough, coughing up of blood.              change in color of mucus.  wheezing.   Skin:    rash or lesions. GI:  No-   heartburn, indigestion, abdominal pain, nausea, vomiting, diarrhea,                 change in bowel habits, loss of appetite GU: dysuria, change  in color of urine, no urgency or frequency.   flank pain. MS:   joint pain, stiffness, decreased range of motion, back pain. Neuro-     nothing unusual Psych:  change in mood or affect.  depression or anxiety.   memory loss.  OBJ- Physical Exam General- Alert, Oriented, Affect-appropriate, Distress- none acute,  + morbidly obese Skin- rash-none, lesions- none, excoriation- none Lymphadenopathy- none Head- atraumatic            Eyes- Gross vision intact, PERRLA, conjunctivae and secretions clear            Ears- Hearing, canals-normal            Nose- Clear, no-Septal dev, mucus, polyps, erosion, perforation             Throat- Mallampati III_IV , mucosa clear , drainage- none, tonsils- atrophic,  + teeth  Neck- flexible , trachea midline, no stridor , thyroid nl, carotid no bruit Chest - symmetrical excursion , unlabored           Heart/CV- +IRR/ AFib , no murmur , no gallop  , no rub, nl s1 s2                           - JVD+1 , edema+tight 4+, stasis changes- none, varices- none           Lung- clear to P&A, wheeze- none, cough- none , dullness-none, rub- none           Chest wall-  Abd-  Br/ Gen/ Rectal- Not done, not indicated Extrem- cyanosis- none, clubbing, none, atrophy- none, strength- nl Neuro- grossly intact to observation

## 2023-01-11 ENCOUNTER — Ambulatory Visit: Payer: BC Managed Care – PPO | Admitting: Internal Medicine

## 2023-01-20 ENCOUNTER — Encounter (INDEPENDENT_AMBULATORY_CARE_PROVIDER_SITE_OTHER): Payer: Self-pay | Admitting: Family Medicine

## 2023-01-20 ENCOUNTER — Ambulatory Visit (INDEPENDENT_AMBULATORY_CARE_PROVIDER_SITE_OTHER): Payer: Managed Care, Other (non HMO) | Admitting: Family Medicine

## 2023-01-20 VITALS — BP 111/74 | HR 79 | Temp 98.0°F | Ht 70.0 in | Wt 327.0 lb

## 2023-01-20 DIAGNOSIS — F3289 Other specified depressive episodes: Secondary | ICD-10-CM

## 2023-01-20 DIAGNOSIS — E559 Vitamin D deficiency, unspecified: Secondary | ICD-10-CM | POA: Diagnosis not present

## 2023-01-20 DIAGNOSIS — Z6841 Body Mass Index (BMI) 40.0 and over, adult: Secondary | ICD-10-CM

## 2023-01-20 DIAGNOSIS — E669 Obesity, unspecified: Secondary | ICD-10-CM

## 2023-01-20 MED ORDER — BUPROPION HCL ER (XL) 150 MG PO TB24: 150.0000 mg | ORAL_TABLET | Freq: Every day | ORAL | 0 refills | Status: DC

## 2023-01-20 NOTE — Progress Notes (Signed)
Chief Complaint:   OBESITY Dakota Hernandez is here to discuss his progress with his obesity treatment plan along with follow-up of his obesity related diagnoses. Dakota Hernandez is on the Category 4 Plan and states he is following his eating plan approximately 50% of the time. Dakota Hernandez states he is walking 1-3 miles 7 times per week.  Today's visit was #: 26 Starting weight: 382 LBS Starting date: 01/28/2021 Today's weight: 327 LBS Today's date: 01/20/2023 Total lbs lost to date: 55 LBS Total lbs lost since last in-office visit: +4 LBS  Interim History: Patient's job has been more stressful recently due to significant under staffing; there is minimal planning on when open vacant positions will be filled.  He is still doing relatively well eating dinner on plan.  Lunch is hit or miss because sometimes he takes lunch with him and then will eat out and save his lunch for the next day.  Breakfast is still a struggle- most mornings he is eating at chick fil a and getting chicken bow (420 calories and 30g of protein).  Next few weeks he only has an upcoming visit at work by [employer]. He is looking to take vacation time at some point.  Subjective:   1. Vitamin D deficiency Patient is taking vitamin D 1000 IU daily.  Patient denies nausea, vomiting, muscle weakness but is positive for fatigue.  2. Other depression, emotional eating behaviors Patient is on Wellbutrin, not taking currently due to running out.  Patient denies suicidal or homicidal ideations.  Assessment/Plan:   1. Vitamin D deficiency Continue OTC vitamin D.  No change in medication.   2. Other depression, emotional eating behaviors Refill- buPROPion (WELLBUTRIN XL) 150 MG 24 hr tablet; Take 1 tablet (150 mg total) by mouth daily.  Dispense: 90 tablet; Refill: 0  3. BMI 45.0-49.9, adult  4. Obesity with starting BMI of 56.4 Dakota Hernandez is currently in the action stage of change. As such, his goal is to continue with weight loss efforts. He has  agreed to the Category 4 Plan.   Exercise goals:  As is.  Behavioral modification strategies: increasing lean protein intake, meal planning and cooking strategies, keeping healthy foods in the home, and planning for success.  Dakota Hernandez has agreed to follow-up with our clinic in 4-5 weeks. He was informed of the importance of frequent follow-up visits to maximize his success with intensive lifestyle modifications for his multiple health conditions.   Objective:   Blood pressure 111/74, pulse 79, temperature 98 F (36.7 C), height 5\' 10"  (1.778 m), weight (!) 327 lb (148.3 kg), SpO2 99 %. Body mass index is 46.92 kg/m.  General: Cooperative, alert, well developed, in no acute distress. HEENT: Conjunctivae and lids unremarkable. Cardiovascular: Regular rhythm.  Lungs: Normal work of breathing. Neurologic: No focal deficits.   Lab Results  Component Value Date   CREATININE 1.11 11/10/2022   BUN 18 11/10/2022   NA 144 11/10/2022   K 4.3 11/10/2022   CL 109 (H) 11/10/2022   CO2 23 11/10/2022   Lab Results  Component Value Date   ALT 11 11/10/2022   AST 15 11/10/2022   ALKPHOS 82 11/10/2022   BILITOT 0.7 11/10/2022   Lab Results  Component Value Date   HGBA1C 5.9 (H) 11/10/2022   HGBA1C 5.4 03/11/2022   HGBA1C 5.0 11/19/2021   HGBA1C 5.6 06/25/2021   HGBA1C 6.2 (H) 01/28/2021   Lab Results  Component Value Date   INSULIN 10.3 11/10/2022   INSULIN 13.6 03/11/2022  INSULIN 8.4 06/25/2021   INSULIN 17.7 01/28/2021   Lab Results  Component Value Date   TSH 0.875 01/28/2021   Lab Results  Component Value Date   CHOL 168 11/10/2022   HDL 49 11/10/2022   LDLCALC 107 (H) 11/10/2022   TRIG 61 11/10/2022   CHOLHDL 4.0 CALC 06/14/2008   Lab Results  Component Value Date   VD25OH 64.3 11/10/2022   VD25OH 65.3 03/11/2022   VD25OH 55.6 06/25/2021   Lab Results  Component Value Date   WBC 5.4 09/15/2022   HGB 13.3 09/15/2022   HCT 43.0 09/15/2022   MCV 89 09/15/2022    PLT 197 09/15/2022   No results found for: "IRON", "TIBC", "FERRITIN"  Attestation Statements:   Reviewed by clinician on day of visit: allergies, medications, problem list, medical history, surgical history, family history, social history, and previous encounter notes.  I, Malcolm Metro, RMA, am acting as transcriptionist for Reuben Likes, MD.  I have reviewed the above documentation for accuracy and completeness, and I agree with the above. - Reuben Likes, MD

## 2023-02-17 ENCOUNTER — Ambulatory Visit (INDEPENDENT_AMBULATORY_CARE_PROVIDER_SITE_OTHER): Payer: Managed Care, Other (non HMO) | Admitting: Family Medicine

## 2023-02-17 ENCOUNTER — Encounter (INDEPENDENT_AMBULATORY_CARE_PROVIDER_SITE_OTHER): Payer: Self-pay | Admitting: Family Medicine

## 2023-02-17 VITALS — BP 137/86 | HR 74 | Temp 97.6°F | Ht 70.0 in | Wt 333.0 lb

## 2023-02-17 DIAGNOSIS — Z6841 Body Mass Index (BMI) 40.0 and over, adult: Secondary | ICD-10-CM

## 2023-02-17 DIAGNOSIS — E669 Obesity, unspecified: Secondary | ICD-10-CM

## 2023-02-17 DIAGNOSIS — R7303 Prediabetes: Secondary | ICD-10-CM | POA: Diagnosis not present

## 2023-02-17 NOTE — Progress Notes (Signed)
Chief Complaint:   OBESITY Dakota Hernandez is here to discuss his progress with his obesity treatment plan along with follow-up of his obesity related diagnoses. Dakota Hernandez is on the Category 4 Plan and states he is following his eating plan approximately 50% of the time. Dakota Hernandez states he is walking 1 mile 2 times per week, and walking at work 5 times per week.  Today's visit was #: 27 Starting weight: 382 lbs Starting date: 01/28/2021 Today's weight: 333 lbs Today's date: 02/17/2023 Total lbs lost to date: 49 Total lbs lost since last in-office visit: 0  Interim History: Patient has been mostly working over the last few weeks. Breakfast is still a struggle and sometimes he will still stop and get food even though he has food at home.  Recognizes he is doing still a significant amount of stress eating.  Feeling less motivated. He is quite frequently missing breakfast.  Not sure what he is doing for [facility] yet as he isn't sure if he is doing anything.  He realizes he has stuff for lunch and dinner at home but still occasionally would rather eat out than make food at home.  May want to start planning a vacation.   Subjective:   1. Prediabetes Dakota Hernandez's last A1c was 5.9, and his last insulin level was 10.3.  He is not on medications.  Assessment/Plan:   1. Prediabetes We discussed we will discuss medications at his next appointment if he is still struggling with his meal plan.  2. BMI 45.0-49.9, adult (HCC)  3. Obesity with starting BMI of 56.4 Dakota Hernandez is currently in the action stage of change. As such, his goal is to continue with weight loss efforts. He has agreed to the Category 4 Plan.   Exercise goals: As is.   Behavioral modification strategies: increasing lean protein intake, meal planning and cooking strategies, keeping healthy foods in the home, and planning for success.  Dakota Hernandez has agreed to follow-up with our clinic in 5 to 6 weeks. He was informed of the importance of  frequent follow-up visits to maximize his success with intensive lifestyle modifications for his multiple health conditions.   Objective:   Blood pressure 137/86, pulse 74, temperature 97.6 F (36.4 C), height 5\' 10"  (1.778 m), weight (!) 333 lb (151 kg), SpO2 97 %. Body mass index is 47.78 kg/m.  General: Cooperative, alert, well developed, in no acute distress. HEENT: Conjunctivae and lids unremarkable. Cardiovascular: Regular rhythm.  Lungs: Normal work of breathing. Neurologic: No focal deficits.   Lab Results  Component Value Date   CREATININE 1.11 11/10/2022   BUN 18 11/10/2022   NA 144 11/10/2022   K 4.3 11/10/2022   CL 109 (H) 11/10/2022   CO2 23 11/10/2022   Lab Results  Component Value Date   ALT 11 11/10/2022   AST 15 11/10/2022   ALKPHOS 82 11/10/2022   BILITOT 0.7 11/10/2022   Lab Results  Component Value Date   HGBA1C 5.9 (H) 11/10/2022   HGBA1C 5.4 03/11/2022   HGBA1C 5.0 11/19/2021   HGBA1C 5.6 06/25/2021   HGBA1C 6.2 (H) 01/28/2021   Lab Results  Component Value Date   INSULIN 10.3 11/10/2022   INSULIN 13.6 03/11/2022   INSULIN 8.4 06/25/2021   INSULIN 17.7 01/28/2021   Lab Results  Component Value Date   TSH 0.875 01/28/2021   Lab Results  Component Value Date   CHOL 168 11/10/2022   HDL 49 11/10/2022   LDLCALC 107 (H) 11/10/2022  TRIG 61 11/10/2022   CHOLHDL 4.0 CALC 06/14/2008   Lab Results  Component Value Date   VD25OH 64.3 11/10/2022   VD25OH 65.3 03/11/2022   VD25OH 55.6 06/25/2021   Lab Results  Component Value Date   WBC 5.4 09/15/2022   HGB 13.3 09/15/2022   HCT 43.0 09/15/2022   MCV 89 09/15/2022   PLT 197 09/15/2022   No results found for: "IRON", "TIBC", "FERRITIN"  Attestation Statements:   Reviewed by clinician on day of visit: allergies, medications, problem list, medical history, surgical history, family history, social history, and previous encounter notes.  Time spent on visit including pre-visit chart  review and post-visit care and charting was 25 minutes.   I, Burt Knack, am acting as transcriptionist for Reuben Likes, MD.  I have reviewed the above documentation for accuracy and completeness, and I agree with the above. - Reuben Likes, MD

## 2023-02-18 ENCOUNTER — Ambulatory Visit (INDEPENDENT_AMBULATORY_CARE_PROVIDER_SITE_OTHER): Payer: Managed Care, Other (non HMO) | Admitting: Family Medicine

## 2023-03-22 ENCOUNTER — Other Ambulatory Visit: Payer: Self-pay

## 2023-03-22 ENCOUNTER — Emergency Department (HOSPITAL_COMMUNITY)
Admission: EM | Admit: 2023-03-22 | Discharge: 2023-03-22 | Disposition: A | Discharge status: Home / Self Care | Payer: Managed Care, Other (non HMO) | Attending: Emergency Medicine | Admitting: Emergency Medicine

## 2023-03-22 ENCOUNTER — Emergency Department (HOSPITAL_COMMUNITY): Payer: Managed Care, Other (non HMO)

## 2023-03-22 ENCOUNTER — Encounter (HOSPITAL_COMMUNITY): Payer: Self-pay

## 2023-03-22 DIAGNOSIS — E119 Type 2 diabetes mellitus without complications: Secondary | ICD-10-CM | POA: Insufficient documentation

## 2023-03-22 DIAGNOSIS — Z79899 Other long term (current) drug therapy: Secondary | ICD-10-CM | POA: Insufficient documentation

## 2023-03-22 DIAGNOSIS — R059 Cough, unspecified: Secondary | ICD-10-CM | POA: Diagnosis not present

## 2023-03-22 DIAGNOSIS — J181 Lobar pneumonia, unspecified organism: Secondary | ICD-10-CM | POA: Insufficient documentation

## 2023-03-22 DIAGNOSIS — J189 Pneumonia, unspecified organism: Secondary | ICD-10-CM

## 2023-03-22 DIAGNOSIS — I10 Essential (primary) hypertension: Secondary | ICD-10-CM | POA: Diagnosis not present

## 2023-03-22 DIAGNOSIS — Z1152 Encounter for screening for COVID-19: Secondary | ICD-10-CM | POA: Insufficient documentation

## 2023-03-22 DIAGNOSIS — Z7901 Long term (current) use of anticoagulants: Secondary | ICD-10-CM | POA: Insufficient documentation

## 2023-03-22 DIAGNOSIS — R051 Acute cough: Secondary | ICD-10-CM

## 2023-03-22 LAB — RESP PANEL BY RT-PCR (RSV, FLU A&B, COVID)  RVPGX2
Influenza A by PCR: NEGATIVE
Influenza B by PCR: NEGATIVE
Resp Syncytial Virus by PCR: NEGATIVE
SARS Coronavirus 2 by RT PCR: NEGATIVE

## 2023-03-22 MED ORDER — AMOXICILLIN-POT CLAVULANATE 875-125 MG PO TABS: 1.0000 | ORAL_TABLET | Freq: Two times a day (BID) | ORAL | 0 refills | Status: DC

## 2023-03-22 MED ORDER — BENZONATATE 100 MG PO CAPS: 100.0000 mg | ORAL_CAPSULE | Freq: Three times a day (TID) | ORAL | 0 refills | Status: DC

## 2023-03-22 NOTE — ED Triage Notes (Signed)
Pt. Arrives POV c/o cough and congestion x3 days. Pt. States that he has been coughing up red phlegm. He has also had some loss of appetite. Pt. States that he feels like he is dehydrated. Because he hasn't been drinking fluids like he is supposed to. Pt. Denies NVD.

## 2023-03-22 NOTE — Discharge Instructions (Addendum)
You were seen in the emergency department for cough.  As we discussed, your chest x-ray has some evidence of possible pneumonia in your left lower lobe. We will treat with antibiotics and a prescription cough medicine.  I suspect the blood in your phlegm is likely from heavy coughing and your blood thinner.  Continue to monitor how you're doing and return to the ER for new or worsening symptoms such as fever, difficulty breathing, persistent bloody sputum.

## 2023-03-22 NOTE — ED Provider Notes (Signed)
[place] EMERGENCY DEPARTMENT AT [facility] Provider Note   CSN: [id number] Arrival date & time: 03/22/23  4098     History  Chief Complaint  Patient presents with   Cough    Dakota Hernandez is a 57 y.o. male with history of HTN, atrial fibrillation on eliquis, OSA on CPAP, cardiomyopathy, diabetes, who presents to the ER complaining of cough and congestion x 3 days. States he has been coughing up red phlegm. Feels dehydrated and reports loss of appetite. Denies fever, nausea, vomiting, diarrhea.    Cough Associated symptoms: wheezing   Associated symptoms: no chest pain and no fever        Home Medications Prior to Admission medications   Medication Sig Start Date End Date Taking? Authorizing Provider  amoxicillin-clavulanate (AUGMENTIN) 875-125 MG tablet Take 1 tablet by mouth every 12 (twelve) hours. 03/22/23  Yes Pierson Vantol T, PA-C  benzonatate (TESSALON) 100 MG capsule Take 1 capsule (100 mg total) by mouth every 8 (eight) hours. 03/22/23  Yes Mariea Mcmartin T, PA-C  apixaban (ELIQUIS) 5 MG TABS tablet Take 1 tablet (5 mg total) by mouth 2 (two) times daily. 11/19/22   Lewayne Bunting, MD  buPROPion (WELLBUTRIN XL) 150 MG 24 hr tablet Take 1 tablet (150 mg total) by mouth daily. 01/20/23   Langston Reusing, MD  cholecalciferol (VITAMIN D3) 25 MCG (1000 UNIT) tablet Take 1,000 Units by mouth daily.    [provider]  diltiazem (CARDIZEM CD) 120 MG 24 hr capsule Take 1 capsule (120 mg total) by mouth daily. 09/23/22   Lewayne Bunting, MD  furosemide (LASIX) 20 MG tablet Take 1 tablet (20 mg total) by mouth daily. 09/23/22   Lewayne Bunting, MD  metoprolol (TOPROL-XL) 200 MG 24 hr tablet Take 1 tablet (200 mg total) by mouth daily. 09/23/22   Lewayne Bunting, MD  omeprazole (PRILOSEC OTC) 20 MG tablet Take 20 mg by mouth daily before breakfast.     [provider]  vitamin B-12 (CYANOCOBALAMIN) 1000 MCG tablet Take 1 tablet  (1,000 mcg total) by mouth daily. 03/27/21   Langston Reusing, MD      Allergies    Hydrochlorothiazide    Review of Systems   Review of Systems  Constitutional:  Negative for fever.  HENT:  Positive for congestion.   Respiratory:  Positive for cough and wheezing.   Cardiovascular:  Negative for chest pain.  All other systems reviewed and are negative.   Physical Exam Updated Vital Signs BP (!) 132/97   Pulse 88   Temp 98.5 F (36.9 C)   Resp 15   SpO2 99%  Physical Exam Vitals and nursing note reviewed.  Constitutional:      Appearance: Normal appearance.  HENT:     Head: Normocephalic and atraumatic.  Eyes:     Conjunctiva/sclera: Conjunctivae normal.  Cardiovascular:     Rate and Rhythm: Normal rate and regular rhythm.  Pulmonary:     Effort: Pulmonary effort is normal. No respiratory distress.     Breath sounds: Wheezing present.     Comments: Very mild expiratory wheezing in all lung fields Abdominal:     General: There is no distension.     Palpations: Abdomen is soft.     Tenderness: There is no abdominal tenderness.  Skin:    General: Skin is warm and dry.  Neurological:     General: No focal deficit present.     Mental Status: He  is alert.     ED Results / Procedures / Treatments   Labs (all labs ordered are listed, but only abnormal results are displayed) Labs Reviewed  RESP PANEL BY RT-PCR (RSV, FLU A&B, COVID)  [id number]    EKG None  Radiology DG Chest 2 View  Result Date: 03/22/2023 CLINICAL DATA:  Productive Cough EXAM: CHEST - 2 VIEW COMPARISON:  CXR 04/14/20 FINDINGS: The heart size and mediastinal contours are within normal limits. No pleural effusion. No pneumothorax. Hazy opacity at the left lung base could represent atelectasis or infection. The visualized skeletal structures are unremarkable. IMPRESSION: Hazy opacity at the left lung base could represent atelectasis or infection. Electronically Signed   By: Lorenza Cambridge M.D.   On:  03/22/2023 07:24    Procedures Procedures    Medications Ordered in ED Medications - No data to display  ED Course/ Medical Decision Making/ A&P                             Medical Decision Making Amount and/or Complexity of Data Reviewed Radiology: ordered.  Risk Prescription drug management.   This patient is a 57 y.o. male  who presents to the ED for concern of cough and congestion x 3 days.   Differential diagnoses prior to evaluation: The emergent differential diagnosis includes, but is not limited to,  Upper respiratory infection, lower respiratory infection, allergies, asthma, irritants, foreign body, medications (ACE inhibitors), reflux, CHF, lung cancer, interstitial lung disease, psychiatric causes, postnasal drip. This is not an exhaustive differential.   Past Medical History / Co-morbidities / Social History: HTN, atrial fibrillation on eliquis, OSA on CPAP, cardiomyopathy, diabetes  Physical Exam: Physical exam performed. The pertinent findings include: Mildly hypertensive, otherwise normal vital signs, no acute distress. Normal respiratory effort, normal O2 on room air. Very mild expiratory wheezing.   Lab Tests/Imaging studies: I personally interpreted labs/imaging and the pertinent results include:  negative respiratory panel. CXR with haziness concern for left lower lobe pneumonia vs atelectasis. I agree with the radiologist interpretation.  Disposition: After consideration of the diagnostic results and the patients response to treatment, I feel that emergency department workup does not suggest an emergent condition requiring admission or immediate intervention beyond what has been performed at this time. The plan is: discharge to home with treatment for possible community acquired pneumonia vs viral bronchial infection. Will prescribe antibiotics and cough medication. Overall patient is clinically well appearing, does not meet SIRS criteria. Suspect blood tinged  sputum from heavy coughing while on eliquis. No frank hematemesis. The patient is safe for discharge and has been instructed to return immediately for worsening symptoms, change in symptoms or any other concerns.  Final Clinical Impression(s) / ED Diagnoses Final diagnoses:  Community acquired pneumonia of left lower lobe of lung  Acute cough    Rx / DC Orders ED Discharge Orders          Ordered    amoxicillin-clavulanate (AUGMENTIN) 875-125 MG tablet  Every 12 hours        03/22/23 0823    benzonatate (TESSALON) 100 MG capsule  Every 8 hours        03/22/23 0823           Portions of this report may have been transcribed using voice recognition software. Every effort was made to ensure accuracy; however, inadvertent computerized transcription errors may be present.    Ajax Schroll T, PA-C 03/22/23 0827  Gerhard Munch, MD 03/22/23 1003

## 2023-03-23 ENCOUNTER — Ambulatory Visit (INDEPENDENT_AMBULATORY_CARE_PROVIDER_SITE_OTHER): Payer: Managed Care, Other (non HMO) | Admitting: Family Medicine

## 2023-03-23 ENCOUNTER — Telehealth: Payer: Self-pay

## 2023-03-23 NOTE — Transitions of Care (Post Inpatient/ED Visit) (Signed)
   03/23/2023  Name: Dakota Hernandez MRN: [id number] DOB: 1966/01/24  Today's TOC FU Call Status: Today's TOC FU Call Status:: Successful TOC FU Call Competed TOC FU Call Complete Date: 03/23/23  Transition Care Management Follow-up Telephone Call Date of Discharge: 03/22/23 Discharge Facility: [facility]) Type of Discharge: Emergency Department Reason for ED Visit: Respiratory Respiratory Diagnosis: Pnuemonia How have you been since you were released from the hospital?: Better Any questions or concerns?: No  Items Reviewed: Did you receive and understand the discharge instructions provided?: Yes Medications obtained,verified, and reconciled?: Yes (Medications Reviewed) Any new allergies since your discharge?: No Dietary orders reviewed?: Yes Do you have support at home?: No  Medications Reviewed Today: Medications Reviewed Today     Reviewed by Karena Addison, LPN (Licensed Practical Nurse) on 03/23/23 at 1331  Med List Status: <None>   Medication Order Taking? Sig Documenting Provider Last Dose Status Informant  amoxicillin-clavulanate (AUGMENTIN) 875-125 MG tablet [id number]  Take 1 tablet by mouth every 12 (twelve) hours. Roemhildt, Lorin T, PA-C  Active   apixaban (ELIQUIS) 5 MG TABS tablet [id number] No Take 1 tablet (5 mg total) by mouth 2 (two) times daily. Lewayne Bunting, MD Taking Active   benzonatate (TESSALON) 100 MG capsule [id number]  Take 1 capsule (100 mg total) by mouth every 8 (eight) hours. Roemhildt, Lorin T, PA-C  Active   buPROPion (WELLBUTRIN XL) 150 MG 24 hr tablet [id number] No Take 1 tablet (150 mg total) by mouth daily. Langston Reusing, MD Taking Active   cholecalciferol (VITAMIN D3) 25 MCG (1000 UNIT) tablet [id number] No Take 1,000 Units by mouth daily. [provider] Taking Active Self  diltiazem (CARDIZEM CD) 120 MG 24 hr capsule [id number] No Take 1 capsule (120 mg total) by mouth daily. Lewayne Bunting, MD Taking Active    furosemide (LASIX) 20 MG tablet [id number] No Take 1 tablet (20 mg total) by mouth daily. Lewayne Bunting, MD Taking Active   metoprolol (TOPROL-XL) 200 MG 24 hr tablet [id number] No Take 1 tablet (200 mg total) by mouth daily. Lewayne Bunting, MD Taking Active   omeprazole (PRILOSEC OTC) 20 MG tablet [id number] No Take 20 mg by mouth daily before breakfast.  [provider] Taking Active Self  vitamin B-12 (CYANOCOBALAMIN) 1000 MCG tablet [id number] No Take 1 tablet (1,000 mcg total) by mouth daily. Langston Reusing, MD Taking Active Self            Home Care and Equipment/Supplies: Were Home Health Services Ordered?: NA Any new equipment or medical supplies ordered?: NA  Functional Questionnaire: Do you need assistance with bathing/showering or dressing?: No Do you need assistance with meal preparation?: No Do you need assistance with eating?: No Do you have difficulty maintaining continence: No Do you need assistance with getting out of bed/getting out of a chair/moving?: No Do you have difficulty managing or taking your medications?: No  Follow up appointments reviewed: PCP Follow-up appointment confirmed?: No (declined) MD Provider Line [phone] Given: No Specialist Hospital Follow-up appointment confirmed?: NA Do you need transportation to your follow-up appointment?: No Do you understand care options if your condition(s) worsen?: Yes-patient verbalized understanding    SIGNATURE Karena Addison, LPN [facility] Nurse Health Advisor Direct Dial [phone]

## 2023-04-18 ENCOUNTER — Other Ambulatory Visit (INDEPENDENT_AMBULATORY_CARE_PROVIDER_SITE_OTHER): Payer: Self-pay | Admitting: Family Medicine

## 2023-04-18 DIAGNOSIS — F3289 Other specified depressive episodes: Secondary | ICD-10-CM

## 2023-04-21 ENCOUNTER — Encounter (INDEPENDENT_AMBULATORY_CARE_PROVIDER_SITE_OTHER): Payer: Self-pay | Admitting: Family Medicine

## 2023-04-21 ENCOUNTER — Ambulatory Visit (INDEPENDENT_AMBULATORY_CARE_PROVIDER_SITE_OTHER): Payer: Managed Care, Other (non HMO) | Admitting: Family Medicine

## 2023-04-21 VITALS — BP 142/94 | HR 76 | Temp 98.1°F | Ht 70.0 in | Wt 333.0 lb

## 2023-04-21 DIAGNOSIS — E669 Obesity, unspecified: Secondary | ICD-10-CM | POA: Diagnosis not present

## 2023-04-21 DIAGNOSIS — F3289 Other specified depressive episodes: Secondary | ICD-10-CM | POA: Diagnosis not present

## 2023-04-21 DIAGNOSIS — I10 Essential (primary) hypertension: Secondary | ICD-10-CM | POA: Diagnosis not present

## 2023-04-21 DIAGNOSIS — Z6841 Body Mass Index (BMI) 40.0 and over, adult: Secondary | ICD-10-CM

## 2023-04-21 MED ORDER — BUPROPION HCL ER (XL) 150 MG PO TB24
150.0000 mg | ORAL_TABLET | Freq: Every day | ORAL | 0 refills | Status: DC
Start: 2023-04-21 — End: 2023-05-20

## 2023-04-21 NOTE — Progress Notes (Signed)
Chief Complaint:   OBESITY Dakota Hernandez is here to discuss his progress with his obesity treatment plan along with follow-up of his obesity related diagnoses. Dakota Hernandez is on the Category 4 Plan and states he is following his eating plan approximately 50% of the time. Dakota Hernandez states he is walking at work.    Today's visit was #: 28 Starting weight: 382 lbs Starting date: 01/28/2021 Today's weight: 333 lbs Today's date: 04/21/2023 Total lbs lost to date: 49 Total lbs lost since last in-office visit: 0  Interim History: Patient stayed at home for July 4th due to heat.  He was also seen in the emergency department for pneumonia since last appointment. He is feeling better from that.  He realizes he needs to get back to the compliance to meal plan to what he was in previously.  He is going to work at [employer] daily and not able to get any activity in.  He put in for vacation for second week of September but not sure what he wants to do yet.  He realizes he needs to get back to packing his lunch (was previously doing this 4x a week).  Stuff for lunch is at home and he can start packing lunch as early as tomorrow.  Subjective:   1. Essential hypertension Patient's blood pressure is slightly elevated today.  He denies chest pain, chest pressure, or headache.  2. Other depression, emotional eating behaviors Patient is on Wellbutrin with some control of food indulgences.  No side effects were noted.  Assessment/Plan:   1. Essential hypertension Patient will continue Cardizem and Toprol, with no change in medication or dose.  2. Other depression, emotional eating behaviors Patient will continue Wellbutrin XL 150 mg, and we will refill for 90 days.  - buPROPion (WELLBUTRIN XL) 150 MG 24 hr tablet; Take 1 tablet (150 mg total) by mouth daily.  Dispense: 90 tablet; Refill: 0  3. BMI 45.0-49.9, adult (HCC)  4. Obesity with starting BMI of 56.4 Dakota Hernandez is currently in the action stage of change. As such,  his goal is to continue with weight loss efforts. He has agreed to the Category 4 Plan.   Exercise goals: All adults should avoid inactivity. Some physical activity is better than none, and adults who participate in any amount of physical activity gain some health benefits.  Behavioral modification strategies: increasing lean protein intake, meal planning and cooking strategies, keeping healthy foods in the home, and planning for success.  Dakota Hernandez has agreed to follow-up with our clinic in 4 weeks. He was informed of the importance of frequent follow-up visits to maximize his success with intensive lifestyle modifications for his multiple health conditions.   Objective:   Blood pressure (!) 142/94, pulse 76, temperature 98.1 F (36.7 C), height 5\' 10"  (1.778 m), weight (!) 333 lb (151 kg), SpO2 96%. Body mass index is 47.78 kg/m.  General: Cooperative, alert, well developed, in no acute distress. HEENT: Conjunctivae and lids unremarkable. Cardiovascular: Regular rhythm.  Lungs: Normal work of breathing. Neurologic: No focal deficits.   Lab Results  Component Value Date   CREATININE 1.11 11/10/2022   BUN 18 11/10/2022   NA 144 11/10/2022   K 4.3 11/10/2022   CL 109 (H) 11/10/2022   CO2 23 11/10/2022   Lab Results  Component Value Date   ALT 11 11/10/2022   AST 15 11/10/2022   ALKPHOS 82 11/10/2022   BILITOT 0.7 11/10/2022   Lab Results  Component Value Date  HGBA1C 5.9 (H) 11/10/2022   HGBA1C 5.4 03/11/2022   HGBA1C 5.0 11/19/2021   HGBA1C 5.6 06/25/2021   HGBA1C 6.2 (H) 01/28/2021   Lab Results  Component Value Date   INSULIN 10.3 11/10/2022   INSULIN 13.6 03/11/2022   INSULIN 8.4 06/25/2021   INSULIN 17.7 01/28/2021   Lab Results  Component Value Date   TSH 0.875 01/28/2021   Lab Results  Component Value Date   CHOL 168 11/10/2022   HDL 49 11/10/2022   LDLCALC 107 (H) 11/10/2022   TRIG 61 11/10/2022   CHOLHDL 4.0 CALC 06/14/2008   Lab Results   Component Value Date   VD25OH 64.3 11/10/2022   VD25OH 65.3 03/11/2022   VD25OH 55.6 06/25/2021   Lab Results  Component Value Date   WBC 5.4 09/15/2022   HGB 13.3 09/15/2022   HCT 43.0 09/15/2022   MCV 89 09/15/2022   PLT 197 09/15/2022   No results found for: "IRON", "TIBC", "FERRITIN"  Attestation Statements:   Reviewed by clinician on day of visit: allergies, medications, problem list, medical history, surgical history, family history, social history, and previous encounter notes.   I, Burt Knack, am acting as transcriptionist for Reuben Likes, MD.  I have reviewed the above documentation for accuracy and completeness, and I agree with the above. - Reuben Likes, MD

## 2023-05-09 ENCOUNTER — Other Ambulatory Visit: Payer: Self-pay | Admitting: Cardiology

## 2023-05-09 DIAGNOSIS — I4891 Unspecified atrial fibrillation: Secondary | ICD-10-CM

## 2023-05-10 NOTE — Telephone Encounter (Signed)
Prescription refill request for Eliquis received. Indication:afib Last office visit:12/23 Scr:1.11  2/24 Age: 57 Weight:151  kg  Prescription refilled

## 2023-05-20 ENCOUNTER — Encounter (INDEPENDENT_AMBULATORY_CARE_PROVIDER_SITE_OTHER): Payer: Self-pay | Admitting: Family Medicine

## 2023-05-20 ENCOUNTER — Ambulatory Visit (INDEPENDENT_AMBULATORY_CARE_PROVIDER_SITE_OTHER): Payer: Managed Care, Other (non HMO) | Admitting: Family Medicine

## 2023-05-20 VITALS — BP 136/86 | HR 76 | Temp 97.8°F | Ht 70.0 in | Wt 330.0 lb

## 2023-05-20 DIAGNOSIS — E669 Obesity, unspecified: Secondary | ICD-10-CM | POA: Diagnosis not present

## 2023-05-20 DIAGNOSIS — F3289 Other specified depressive episodes: Secondary | ICD-10-CM | POA: Diagnosis not present

## 2023-05-20 DIAGNOSIS — R7303 Prediabetes: Secondary | ICD-10-CM

## 2023-05-20 DIAGNOSIS — Z6841 Body Mass Index (BMI) 40.0 and over, adult: Secondary | ICD-10-CM

## 2023-05-20 NOTE — Progress Notes (Signed)
Chief Complaint:   OBESITY Dakota Hernandez is here to discuss his progress with his obesity treatment plan along with follow-up of his obesity related diagnoses. Dakota Hernandez is on the Category 4 Plan and states he is following his eating plan approximately 50-55% of the time. Dakota Hernandez states he is doing 0 minutes 0 times per week.  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  Lunch has been better in terms of packing and getting lunch on plan in.  Dinner is 50/50 for following plan and sometimes he gets food that isn't the healthiest option.  Breakfast he is making sure he is getting something in and it may not be enough protein but it is more than he was previously.  He is still doing quite a bit of snacking and it is mostly out of habit or boredom. Feels like he wants to get more on plan of cooking at home and staying more on plan with food options. He is on vacation the second week of September but is hesitant to "pull the trigger" on going on a cruise.   Subjective:   1. Prediabetes Patient's last A1c was 5.9 and insulin 10.3.  He is not on medications.  2. Other depression, emotional eating behaviors Patient is on Wellbutrin daily but he is not noticing much change in drive or desire to snack.  Assessment/Plan:   1. Prediabetes Patient will have repeat labs done with his PCP at his next scheduled appointment.  2. Other depression, emotional eating behaviors Patient is to start Wellbutrin as it is unlikely that an increased dose will lead to much control of snacking, as he is not noticing any change.  3. Obesity, Current BMI is 47.4  4. Obesity with starting BMI of 56.4 Dakota Hernandez is  currently in the action stage of change. As such, his goal is to continue with weight loss efforts. He has agreed to the Category 4 Plan.   Exercise goals: All adults should avoid inactivity. Some physical activity is better than none, and adults who participate in any amount of physical activity gain some health benefits.  Behavioral modification strategies: increasing lean protein intake, meal planning and cooking strategies, keeping healthy foods in the home, and planning for success.  Dakota Hernandez has agreed to follow-up with our clinic in 6 weeks. He was informed of the importance of frequent follow-up visits to maximize his success with intensive lifestyle modifications for his multiple health conditions.   Objective:   Blood pressure 136/86, pulse 76, temperature 97.8 F (36.6 C), height 5\' 10"  (1.778 m), weight (!) 330 lb (149.7 kg), SpO2 96%. Body mass index is 47.35 kg/m.  General: Cooperative, alert, well developed, in no acute distress. HEENT: Conjunctivae and lids unremarkable. Cardiovascular: Regular rhythm.  Lungs: Normal work of breathing. Neurologic: No focal deficits.   Lab Results  Component Value Date   CREATININE 1.11 11/10/2022   BUN 18 11/10/2022   NA 144 11/10/2022   K 4.3 11/10/2022   CL 109 (H) 11/10/2022   CO2 23 11/10/2022   Lab Results  Component Value Date   ALT 11 11/10/2022   AST 15 11/10/2022   ALKPHOS 82 11/10/2022   BILITOT 0.7 11/10/2022  Lab Results  Component Value Date   HGBA1C 5.9 (H) 11/10/2022   HGBA1C 5.4 03/11/2022   HGBA1C 5.0 11/19/2021   HGBA1C 5.6 06/25/2021   HGBA1C 6.2 (H) 01/28/2021   Lab Results  Component Value Date   INSULIN 10.3 11/10/2022   INSULIN 13.6 03/11/2022   INSULIN 8.4 06/25/2021   INSULIN 17.7 01/28/2021   Lab Results  Component Value Date   TSH 0.875 01/28/2021   Lab Results  Component Value Date   CHOL 168 11/10/2022   HDL 49 11/10/2022   LDLCALC 107 (H) 11/10/2022   TRIG 61 11/10/2022    CHOLHDL 4.0 CALC 06/14/2008   Lab Results  Component Value Date   VD25OH 64.3 11/10/2022   VD25OH 65.3 03/11/2022   VD25OH 55.6 06/25/2021   Lab Results  Component Value Date   WBC 5.4 09/15/2022   HGB 13.3 09/15/2022   HCT 43.0 09/15/2022   MCV 89 09/15/2022   PLT 197 09/15/2022   No results found for: "IRON", "TIBC", "FERRITIN"  Attestation Statements:   Reviewed by clinician on day of visit: allergies, medications, problem list, medical history, surgical history, family history, social history, and previous encounter notes.   I, Burt Knack, am acting as transcriptionist for Reuben Likes, MD.  I have reviewed the above documentation for accuracy and completeness, and I agree with the above. - Reuben Likes, MD

## 2023-07-05 ENCOUNTER — Encounter (INDEPENDENT_AMBULATORY_CARE_PROVIDER_SITE_OTHER): Payer: Self-pay | Admitting: Family Medicine

## 2023-07-05 ENCOUNTER — Ambulatory Visit (INDEPENDENT_AMBULATORY_CARE_PROVIDER_SITE_OTHER): Payer: Managed Care, Other (non HMO) | Admitting: Family Medicine

## 2023-07-05 VITALS — BP 117/82 | HR 83 | Temp 97.6°F | Ht 70.0 in | Wt 337.0 lb

## 2023-07-05 DIAGNOSIS — I4891 Unspecified atrial fibrillation: Secondary | ICD-10-CM | POA: Diagnosis not present

## 2023-07-05 DIAGNOSIS — R7303 Prediabetes: Secondary | ICD-10-CM | POA: Diagnosis not present

## 2023-07-05 DIAGNOSIS — Z6841 Body Mass Index (BMI) 40.0 and over, adult: Secondary | ICD-10-CM

## 2023-07-05 DIAGNOSIS — E669 Obesity, unspecified: Secondary | ICD-10-CM | POA: Diagnosis not present

## 2023-07-05 MED ORDER — METFORMIN HCL 500 MG PO TABS
500.0000 mg | ORAL_TABLET | Freq: Every day | ORAL | 0 refills | Status: DC
Start: 2023-07-05 — End: 2023-08-02

## 2023-07-05 NOTE — Progress Notes (Signed)
Chief Complaint:   OBESITY Dakota Hernandez is here to discuss his progress with his obesity treatment plan along with follow-up of his obesity related diagnoses. Dakota Hernandez is on the Category 4 Plan and states he is following his eating plan approximately 50% of the time. Dakota Hernandez states he is walking at work 5,000-10,000 steps 5 times per week.    Today's visit was #: 30 Starting weight: 382 lbs Starting date: 01/28/2021 Today's weight: 337 lbs Today's date: 07/05/2023 Total lbs lost to date: 45 Total lbs lost since last in-office visit: 0  Interim History: Patient voices that this am he felt good and as the day progressed he has felt worse and now his throat hurts and he feels not his normal self.  Since last appointment he took time off but didn't go anywhere.  He stayed at home.  He feels he may have been over indulgent with food choices.  He did have some stress last week due to his nephew having surgery. He is also getting used to his new boss at work. He feels frustrated he isn't where he needs to be.   Subjective:   1. Prediabetes Patient's last A1c was 5.9.  He was previously on metformin, but he cannot remember if he had any side effects with it.  2. Atrial fibrillation, unspecified type [facility]) Patient is on Eliquis, diltiazem, and Toprol.  He sees Cardiology, just received a letter to schedule an appointment.  Assessment/Plan:   1. Prediabetes Patient agreed to start metformin 500 mg once daily with meals, with no refills.  We will follow-up at his next appointment.  - metFORMIN (GLUCOPHAGE) 500 MG tablet; Take 1 tablet (500 mg total) by mouth daily with breakfast.  Dispense: 30 tablet; Refill: 0  2. Atrial fibrillation, unspecified type (HCC) We will follow-up at patient's next appointment to ensure he called and scheduled a follow-up appointment with Cardiology.  3. BMI 45.0-49.9, adult (HCC)  4. Obesity with starting BMI of 56.4 Dakota Hernandez is currently in the action stage of  change. As such, his goal is to continue with weight loss efforts. He has agreed to the Category 4 Plan.   Exercise goals: All adults should avoid inactivity. Some physical activity is better than none, and adults who participate in any amount of physical activity gain some health benefits.  Behavioral modification strategies: increasing lean protein intake, meal planning and cooking strategies, keeping healthy foods in the home, and planning for success.  Dakota Hernandez has agreed to follow-up with our clinic in 4 weeks. He was informed of the importance of frequent follow-up visits to maximize his success with intensive lifestyle modifications for his multiple health conditions.   Objective:   Blood pressure 117/82, pulse 83, temperature 97.6 F (36.4 C), height 5\' 10"  (1.778 m), weight (!) 337 lb (152.9 kg), SpO2 97%. Body mass index is 48.35 kg/m.  General: Cooperative, alert, well developed, in no acute distress. HEENT: Conjunctivae and lids unremarkable. Cardiovascular: Regular rhythm.  Lungs: Normal work of breathing. Neurologic: No focal deficits.   Lab Results  Component Value Date   CREATININE 1.11 11/10/2022   BUN 18 11/10/2022   NA 144 11/10/2022   K 4.3 11/10/2022   CL 109 (H) 11/10/2022   CO2 23 11/10/2022   Lab Results  Component Value Date   ALT 11 11/10/2022   AST 15 11/10/2022   ALKPHOS 82 11/10/2022   BILITOT 0.7 11/10/2022   Lab Results  Component Value Date   HGBA1C 5.9 (H) 11/10/2022  HGBA1C 5.4 03/11/2022   HGBA1C 5.0 11/19/2021   HGBA1C 5.6 06/25/2021   HGBA1C 6.2 (H) 01/28/2021   Lab Results  Component Value Date   INSULIN 10.3 11/10/2022   INSULIN 13.6 03/11/2022   INSULIN 8.4 06/25/2021   INSULIN 17.7 01/28/2021   Lab Results  Component Value Date   TSH 0.875 01/28/2021   Lab Results  Component Value Date   CHOL 168 11/10/2022   HDL 49 11/10/2022   LDLCALC 107 (H) 11/10/2022   TRIG 61 11/10/2022   CHOLHDL 4.0 CALC 06/14/2008   Lab  Results  Component Value Date   VD25OH 64.3 11/10/2022   VD25OH 65.3 03/11/2022   VD25OH 55.6 06/25/2021   Lab Results  Component Value Date   WBC 5.4 09/15/2022   HGB 13.3 09/15/2022   HCT 43.0 09/15/2022   MCV 89 09/15/2022   PLT 197 09/15/2022   No results found for: "IRON", "TIBC", "FERRITIN"  Attestation Statements:   Reviewed by clinician on day of visit: allergies, medications, problem list, medical history, surgical history, family history, social history, and previous encounter notes.   I, Burt Knack, am acting as transcriptionist for Reuben Likes, MD.  I have reviewed the above documentation for accuracy and completeness, and I agree with the above. - Reuben Likes, MD

## 2023-07-26 ENCOUNTER — Other Ambulatory Visit (INDEPENDENT_AMBULATORY_CARE_PROVIDER_SITE_OTHER): Payer: Self-pay | Admitting: Family Medicine

## 2023-07-26 DIAGNOSIS — F3289 Other specified depressive episodes: Secondary | ICD-10-CM

## 2023-07-28 ENCOUNTER — Ambulatory Visit: Payer: BC Managed Care – PPO | Admitting: Internal Medicine

## 2023-08-02 ENCOUNTER — Ambulatory Visit (INDEPENDENT_AMBULATORY_CARE_PROVIDER_SITE_OTHER): Payer: Managed Care, Other (non HMO) | Admitting: Family Medicine

## 2023-08-02 ENCOUNTER — Encounter (INDEPENDENT_AMBULATORY_CARE_PROVIDER_SITE_OTHER): Payer: Self-pay | Admitting: Family Medicine

## 2023-08-02 VITALS — BP 138/82 | HR 95 | Temp 98.2°F | Ht 70.0 in | Wt 327.0 lb

## 2023-08-02 DIAGNOSIS — Z6841 Body Mass Index (BMI) 40.0 and over, adult: Secondary | ICD-10-CM

## 2023-08-02 DIAGNOSIS — R7303 Prediabetes: Secondary | ICD-10-CM | POA: Diagnosis not present

## 2023-08-02 DIAGNOSIS — E669 Obesity, unspecified: Secondary | ICD-10-CM

## 2023-08-02 DIAGNOSIS — E7849 Other hyperlipidemia: Secondary | ICD-10-CM | POA: Diagnosis not present

## 2023-08-02 DIAGNOSIS — E66813 Obesity, class 3: Secondary | ICD-10-CM

## 2023-08-02 NOTE — Progress Notes (Signed)
Chief Complaint:   OBESITY Dakota Hernandez is here to discuss his progress with his obesity treatment plan along with follow-up of his obesity related diagnoses. Dakota Hernandez is on the Category 4 Plan and states he is following his eating plan approximately 80% of the time. Dakota Hernandez states he is doing 0 minutes 0 times per week.  Today's visit was #: 31 Starting weight: 382 lbs Starting date: 01/28/2021 Today's weight: 327 lbs Today's date: 08/02/2023 Total lbs lost to date: 55 Total lbs lost since last in-office visit: 10  Interim History: Patient has been working and staying consistent with meal plan.  He didn't start metformin because he wanted to see what he could do with lifestyle changes alone  He cut some of the foods out that snuck back in.  He isn't hungry but is still overeating snacks; believes this is boredom eating.  He has incorporated some small cups of cereal instead of doing packs of crackers.  Biggest obstacles in the next few weeks are eating at home over the next few weeks and ensuring the calories and protein ratio is where it needs to be to ensure he is getting enough nutrition in.   Subjective:   1. Prediabetes Patient has not started metformin yet.  He still notes an increase in dry for carbohydrates.  2. Other hyperlipidemia Patient's last LDL was 107, HDL 49, and triglycerides 61.  He is not on medications.  Assessment/Plan:   1. Prediabetes Patient is to discontinue metformin (removed from medication list).  2. Other hyperlipidemia Patient will have labs repeated per his PCP.  3. BMI 45.0-49.9, adult (HCC)  4. Obesity, Current BMI is 47.4 Dakota Hernandez is currently in the action stage of change. As such, his goal is to continue with weight loss efforts. He has agreed to the Category 4 Plan.   Exercise goals: All adults should avoid inactivity. Some physical activity is better than none, and adults who participate in any amount of physical activity gain some health  benefits.  Behavioral modification strategies: increasing lean protein intake, meal planning and cooking strategies, keeping healthy foods in the home, and planning for success.  Dakota Hernandez has agreed to follow-up with our clinic in 4 weeks. He was informed of the importance of frequent follow-up visits to maximize his success with intensive lifestyle modifications for his multiple health conditions.   Objective:   Blood pressure 138/82, pulse 95, temperature 98.2 F (36.8 C), height 5\' 10"  (1.778 m), weight (!) 327 lb (148.3 kg), SpO2 97%. Body mass index is 46.92 kg/m.  General: Cooperative, alert, well developed, in no acute distress. HEENT: Conjunctivae and lids unremarkable. Cardiovascular: Regular rhythm.  Lungs: Normal work of breathing. Neurologic: No focal deficits.   Lab Results  Component Value Date   CREATININE 1.11 11/10/2022   BUN 18 11/10/2022   NA 144 11/10/2022   K 4.3 11/10/2022   CL 109 (H) 11/10/2022   CO2 23 11/10/2022   Lab Results  Component Value Date   ALT 11 11/10/2022   AST 15 11/10/2022   ALKPHOS 82 11/10/2022   BILITOT 0.7 11/10/2022   Lab Results  Component Value Date   HGBA1C 5.9 (H) 11/10/2022   HGBA1C 5.4 03/11/2022   HGBA1C 5.0 11/19/2021   HGBA1C 5.6 06/25/2021   HGBA1C 6.2 (H) 01/28/2021   Lab Results  Component Value Date   INSULIN 10.3 11/10/2022   INSULIN 13.6 03/11/2022   INSULIN 8.4 06/25/2021   INSULIN 17.7 01/28/2021   Lab Results  Component Value Date   TSH 0.875 01/28/2021   Lab Results  Component Value Date   CHOL 168 11/10/2022   HDL 49 11/10/2022   LDLCALC 107 (H) 11/10/2022   TRIG 61 11/10/2022   CHOLHDL 4.0 CALC 06/14/2008   Lab Results  Component Value Date   VD25OH 64.3 11/10/2022   VD25OH 65.3 03/11/2022   VD25OH 55.6 06/25/2021   Lab Results  Component Value Date   WBC 5.4 09/15/2022   HGB 13.3 09/15/2022   HCT 43.0 09/15/2022   MCV 89 09/15/2022   PLT 197 09/15/2022   No results found for:  "IRON", "TIBC", "FERRITIN"  Attestation Statements:   Reviewed by clinician on day of visit: allergies, medications, problem list, medical history, surgical history, family history, social history, and previous encounter notes.   I, Burt Knack, am acting as transcriptionist for Reuben Likes, MD.  I have reviewed the above documentation for accuracy and completeness, and I agree with the above. - Reuben Likes, MD

## 2023-08-05 ENCOUNTER — Ambulatory Visit (INDEPENDENT_AMBULATORY_CARE_PROVIDER_SITE_OTHER): Payer: Managed Care, Other (non HMO) | Admitting: Internal Medicine

## 2023-08-05 ENCOUNTER — Encounter: Payer: Self-pay | Admitting: Internal Medicine

## 2023-08-05 ENCOUNTER — Other Ambulatory Visit (INDEPENDENT_AMBULATORY_CARE_PROVIDER_SITE_OTHER): Payer: Self-pay | Admitting: Family Medicine

## 2023-08-05 VITALS — BP 106/80 | HR 80 | Temp 98.3°F | Ht 70.0 in | Wt 332.0 lb

## 2023-08-05 DIAGNOSIS — Z1211 Encounter for screening for malignant neoplasm of colon: Secondary | ICD-10-CM

## 2023-08-05 DIAGNOSIS — R5383 Other fatigue: Secondary | ICD-10-CM | POA: Diagnosis not present

## 2023-08-05 DIAGNOSIS — I420 Dilated cardiomyopathy: Secondary | ICD-10-CM | POA: Diagnosis not present

## 2023-08-05 DIAGNOSIS — I10 Essential (primary) hypertension: Secondary | ICD-10-CM | POA: Diagnosis not present

## 2023-08-05 DIAGNOSIS — R7303 Prediabetes: Secondary | ICD-10-CM

## 2023-08-05 NOTE — Assessment & Plan Note (Signed)
Not using a CPAP Needs to re-start

## 2023-08-05 NOTE — Assessment & Plan Note (Signed)
Better w/wt lossCont w/Diltiazem, Toprol, Lasix

## 2023-08-05 NOTE — Progress Notes (Signed)
Subjective:  Patient ID: Dakota Hernandez, male    DOB: 03-May-1966  Age: 57 y.o. MRN: [id number]  CC: Follow-up   HPI Dakota Hernandez presents for fatigue F/u DVT, CHF, depression  Not using a CPAP Needs to re-start using CPAP  Outpatient Medications Prior to Visit  Medication Sig Dispense Refill   buPROPion (WELLBUTRIN XL) 150 MG 24 hr tablet Take 150 mg by mouth daily.     cholecalciferol (VITAMIN D3) 25 MCG (1000 UNIT) tablet Take 1,000 Units by mouth daily.     diltiazem (CARDIZEM CD) 120 MG 24 hr capsule Take 1 capsule (120 mg total) by mouth daily. 90 capsule 3   ELIQUIS 5 MG TABS tablet TAKE 1 TABLET(5 MG) BY MOUTH TWICE DAILY 60 tablet 5   furosemide (LASIX) 20 MG tablet Take 1 tablet (20 mg total) by mouth daily. 90 tablet 3   metoprolol (TOPROL-XL) 200 MG 24 hr tablet Take 1 tablet (200 mg total) by mouth daily. 90 tablet 3   omeprazole (PRILOSEC OTC) 20 MG tablet Take 20 mg by mouth daily before breakfast.      vitamin B-12 (CYANOCOBALAMIN) 1000 MCG tablet Take 1 tablet (1,000 mcg total) by mouth daily. 90 tablet 0   No facility-administered medications prior to visit.    ROS: Review of Systems  Constitutional:  Positive for fatigue. Negative for appetite change and unexpected weight change.  HENT:  Negative for congestion, nosebleeds, sneezing, sore throat, trouble swallowing and voice change.   Eyes:  Negative for itching and visual disturbance.  Respiratory:  Negative for cough.   Cardiovascular:  Negative for chest pain, palpitations and leg swelling.  Gastrointestinal:  Negative for abdominal distention, blood in stool, diarrhea and nausea.  Genitourinary:  Negative for frequency and hematuria.  Musculoskeletal:  Negative for back pain, gait problem, joint swelling and neck pain.  Skin:  Negative for rash.  Neurological:  Negative for dizziness, tremors, speech difficulty and weakness.  Psychiatric/Behavioral:  Negative for agitation, dysphoric mood,  sleep disturbance and suicidal ideas. The patient is not nervous/anxious.     Objective:  BP 106/80 (BP Location: Left Arm, Patient Position: Sitting, Cuff Size: Normal)   Pulse 80   Temp 98.3 F (36.8 C) (Oral)   Ht 5\' 10"  (1.778 m)   Wt (!) 332 lb (150.6 kg)   SpO2 94%   BMI 47.64 kg/m   BP Readings from Last 3 Encounters:  08/05/23 106/80  08/02/23 138/82  07/05/23 117/82    Wt Readings from Last 3 Encounters:  08/05/23 (!) 332 lb (150.6 kg)  08/02/23 (!) 327 lb (148.3 kg)  07/05/23 (!) 337 lb (152.9 kg)    Physical Exam Constitutional:      General: He is not in acute distress.    Appearance: He is well-developed. He is obese.     Comments: NAD  Eyes:     Conjunctiva/sclera: Conjunctivae normal.     Pupils: Pupils are equal, round, and reactive to light.  Neck:     Thyroid: No thyromegaly.     Vascular: No JVD.  Cardiovascular:     Rate and Rhythm: Normal rate and regular rhythm.     Heart sounds: Normal heart sounds. No murmur heard.    No friction rub. No gallop.  Pulmonary:     Effort: Pulmonary effort is normal. No respiratory distress.     Breath sounds: Normal breath sounds. No wheezing or rales.  Chest:     Chest wall: No tenderness.  Abdominal:     General: Bowel sounds are normal. There is no distension.     Palpations: Abdomen is soft. There is no mass.     Tenderness: There is no abdominal tenderness. There is no guarding or rebound.  Musculoskeletal:        General: No tenderness. Normal range of motion.     Cervical back: Normal range of motion.  Lymphadenopathy:     Cervical: No cervical adenopathy.  Skin:    General: Skin is warm and dry.     Findings: No rash.  Neurological:     Mental Status: He is alert and oriented to person, place, and time.     Cranial Nerves: No cranial nerve deficit.     Motor: No abnormal muscle tone.     Coordination: Coordination normal.     Gait: Gait normal.     Deep Tendon Reflexes: Reflexes are normal  and symmetric.  Psychiatric:        Behavior: Behavior normal.        Thought Content: Thought content normal.        Judgment: Judgment normal.     Lab Results  Component Value Date   WBC 5.4 09/15/2022   HGB 13.3 09/15/2022   HCT 43.0 09/15/2022   PLT 197 09/15/2022   GLUCOSE 95 11/10/2022   CHOL 168 11/10/2022   TRIG 61 11/10/2022   HDL 49 11/10/2022   LDLCALC 107 (H) 11/10/2022   ALT 11 11/10/2022   AST 15 11/10/2022   NA 144 11/10/2022   K 4.3 11/10/2022   CL 109 (H) 11/10/2022   CREATININE 1.11 11/10/2022   BUN 18 11/10/2022   CO2 23 11/10/2022   TSH 0.875 01/28/2021   INR 1.2 04/14/2020   HGBA1C 5.9 (H) 11/10/2022    DG Chest 2 View  Result Date: 03/22/2023 CLINICAL DATA:  Productive Cough EXAM: CHEST - 2 VIEW COMPARISON:  CXR 04/14/20 FINDINGS: The heart size and mediastinal contours are within normal limits. No pleural effusion. No pneumothorax. Hazy opacity at the left lung base could represent atelectasis or infection. The visualized skeletal structures are unremarkable. IMPRESSION: Hazy opacity at the left lung base could represent atelectasis or infection. Electronically Signed   By: Lorenza Cambridge M.D.   On: 03/22/2023 07:24    Assessment & Plan:   Problem List Items Addressed This Visit     OBESITY, MORBID   Relevant Orders   TSH   Urinalysis   CBC with Differential/Platelet   PSA   Comprehensive metabolic panel   Lipid panel   Hemoglobin A1c   Essential hypertension - Primary    Better w/wt lossCont w/Diltiazem, Toprol, Lasix       Relevant Orders   TSH   Urinalysis   CBC with Differential/Platelet   PSA   Comprehensive metabolic panel   Lipid panel   Hemoglobin A1c   Fatigue    Not using a CPAP Needs to re-start      Relevant Orders   Testosterone   Congestive dilated cardiomyopathy (HCC)   Relevant Orders   TSH   Urinalysis   CBC with Differential/Platelet   PSA   Comprehensive metabolic panel   Lipid panel   Hemoglobin A1c    Other Visit Diagnoses     Screening for colon cancer       Relevant Orders   Cologuard         No orders of the defined types were placed in this encounter.  Follow-up: Return in about 6 months (around 02/02/2024) for a follow-up visit.  Sonda Primes, MD

## 2023-08-06 LAB — CBC WITH DIFFERENTIAL/PLATELET
Basophils Absolute: 0.1 10*3/uL (ref 0.0–0.1)
Basophils Relative: 1 % (ref 0.0–3.0)
Eosinophils Absolute: 0.1 10*3/uL (ref 0.0–0.7)
Eosinophils Relative: 1.9 % (ref 0.0–5.0)
HCT: 41.8 % (ref 39.0–52.0)
Hemoglobin: 13.5 g/dL (ref 13.0–17.0)
Lymphocytes Relative: 20.6 % (ref 12.0–46.0)
Lymphs Abs: 1.1 10*3/uL (ref 0.7–4.0)
MCHC: 32.2 g/dL (ref 30.0–36.0)
MCV: 86 fL (ref 78.0–100.0)
Monocytes Absolute: 0.6 10*3/uL (ref 0.1–1.0)
Monocytes Relative: 10.3 % (ref 3.0–12.0)
Neutro Abs: 3.5 10*3/uL (ref 1.4–7.7)
Neutrophils Relative %: 66.2 % (ref 43.0–77.0)
Platelets: 203 10*3/uL (ref 150.0–400.0)
RBC: 4.86 Mil/uL (ref 4.22–5.81)
RDW: 14.9 % (ref 11.5–15.5)
WBC: 5.3 10*3/uL (ref 4.0–10.5)

## 2023-08-06 LAB — URINALYSIS
Hgb urine dipstick: NEGATIVE
Ketones, ur: NEGATIVE
Leukocytes,Ua: NEGATIVE
Nitrite: NEGATIVE
Specific Gravity, Urine: 1.025 (ref 1.000–1.030)
Total Protein, Urine: NEGATIVE
Urine Glucose: NEGATIVE
Urobilinogen, UA: 1 (ref 0.0–1.0)
pH: 6 (ref 5.0–8.0)

## 2023-08-06 LAB — COMPREHENSIVE METABOLIC PANEL
ALT: 12 U/L (ref 0–53)
AST: 16 U/L (ref 0–37)
Albumin: 3.9 g/dL (ref 3.5–5.2)
Alkaline Phosphatase: 69 U/L (ref 39–117)
BUN: 21 mg/dL (ref 6–23)
CO2: 29 meq/L (ref 19–32)
Calcium: 9 mg/dL (ref 8.4–10.5)
Chloride: 107 meq/L (ref 96–112)
Creatinine, Ser: 1.32 mg/dL (ref 0.40–1.50)
GFR: 60.45 mL/min (ref >60.00)
Glucose, Bld: 95 mg/dL (ref 70–99)
Potassium: 4.3 meq/L (ref 3.5–5.1)
Sodium: 146 meq/L — ABNORMAL HIGH (ref 135–145)
Total Bilirubin: 0.7 mg/dL (ref 0.2–1.2)
Total Protein: 6.7 g/dL (ref 6.0–8.3)

## 2023-08-06 LAB — LIPID PANEL
Cholesterol: 159 mg/dL (ref 0–200)
HDL: 45.6 mg/dL (ref >39.00)
LDL Cholesterol: 97 mg/dL (ref 0–99)
NonHDL: 112.92
Total CHOL/HDL Ratio: 3
Triglycerides: 82 mg/dL (ref 0.0–149.0)
VLDL: 16.4 mg/dL (ref 0.0–40.0)

## 2023-08-06 LAB — TSH: TSH: 0.74 u[IU]/mL (ref 0.35–5.50)

## 2023-08-06 LAB — TESTOSTERONE: Testosterone: 167.51 ng/dL — ABNORMAL LOW (ref 300.00–890.00)

## 2023-08-06 LAB — PSA: PSA: 1.63 ng/mL (ref 0.10–4.00)

## 2023-08-06 LAB — HEMOGLOBIN A1C: Hgb A1c MFr Bld: 6 % (ref 4.6–6.5)

## 2023-09-01 ENCOUNTER — Other Ambulatory Visit: Payer: Self-pay | Admitting: Cardiology

## 2023-09-01 ENCOUNTER — Ambulatory Visit (INDEPENDENT_AMBULATORY_CARE_PROVIDER_SITE_OTHER): Payer: Managed Care, Other (non HMO) | Admitting: Family Medicine

## 2023-09-01 ENCOUNTER — Encounter (INDEPENDENT_AMBULATORY_CARE_PROVIDER_SITE_OTHER): Payer: Self-pay | Admitting: Family Medicine

## 2023-09-01 VITALS — BP 113/66 | HR 90 | Temp 97.4°F | Ht 70.0 in | Wt 325.0 lb

## 2023-09-01 DIAGNOSIS — Z6841 Body Mass Index (BMI) 40.0 and over, adult: Secondary | ICD-10-CM | POA: Diagnosis not present

## 2023-09-01 DIAGNOSIS — E669 Obesity, unspecified: Secondary | ICD-10-CM | POA: Diagnosis not present

## 2023-09-01 DIAGNOSIS — I4891 Unspecified atrial fibrillation: Secondary | ICD-10-CM

## 2023-09-01 DIAGNOSIS — R7303 Prediabetes: Secondary | ICD-10-CM

## 2023-09-01 DIAGNOSIS — G4733 Obstructive sleep apnea (adult) (pediatric): Secondary | ICD-10-CM

## 2023-09-01 DIAGNOSIS — I4819 Other persistent atrial fibrillation: Secondary | ICD-10-CM

## 2023-09-01 DIAGNOSIS — R601 Generalized edema: Secondary | ICD-10-CM

## 2023-09-01 NOTE — Assessment & Plan Note (Signed)
Patient last A1c was 6.0 at the end of October.  He was previously really struggling with getting daily nutrition in and was eating out frequently.  We discussed importance of focusing on nutrition through the holiday season by meal prepping and keeping healthier more nutritious snack options at home to help with the nighttime snacking.

## 2023-09-01 NOTE — Assessment & Plan Note (Signed)
Patient has not been using CPAP as consistently as he was previously. He mentions that he doesn't have ability to get chair or table close enough to the bed to be able to more freely move around with CPAP tubing.  Patient encouraged to consider joining a free group like [employer] on facebook to find a free option to hold CPAP machine at night.  Follow up on compliance at next visit.

## 2023-09-01 NOTE — Progress Notes (Signed)
SUBJECTIVE:  Chief Complaint: Obesity  Interim History: Since last appointment patient has been working.  He is worried about his brother who was hospitalized over the weekend due to bile duct obstruction and remergence of his cancer. He is going to [place] for Thanksgiving holiday.  For the next month he is doing family Christmas celebration first weekend in December.  He recognizes he wasn't as consistent as he wanted to be over the last few weeks. Over the next few weeks he wants to get back to eating chicken for dinner and get away from some of the snacking he is doing.  He is recognizes that snacking has become more habitual and isn't related to hunger.   Dakota Hernandez is here to discuss his progress with his obesity treatment plan. He is on the Category 4 Plan and states he is following his eating plan approximately 75-80 % of the time. He states he is not excercising.   OBJECTIVE: Visit Diagnoses: Problem List Items Addressed This Visit       Respiratory   OSA (obstructive sleep apnea)    Patient has not been using CPAP as consistently as he was previously. He mentions that he doesn't have ability to get chair or table close enough to the bed to be able to more freely move around with CPAP tubing.  Patient encouraged to consider joining a free group like [employer] on facebook to find a free option to hold CPAP machine at night.  Follow up on compliance at next visit.        Other   Prediabetes - Primary    Patient last A1c was 6.0 at the end of October.  He was previously really struggling with getting daily nutrition in and was eating out frequently.  We discussed importance of focusing on nutrition through the holiday season by meal prepping and keeping healthier more nutritious snack options at home to help with the nighttime snacking.        Vitals Temp: (!) 97.4 F (36.3 C) BP: 113/66 Pulse Rate: 90 SpO2: 96 %   Anthropometric Measurements Height: 5\' 10"  (1.778  m) Weight: (!) 325 lb (147.4 kg) BMI (Calculated): 46.63 Weight at Last Visit: 327 lb Weight Lost Since Last Visit: 2 lb Weight Gained Since Last Visit: 0 Starting Weight: 382 lb Total Weight Loss (lbs): 56 lb (25.4 kg)   Body Composition  Body Fat %: 45.3 % Fat Mass (lbs): 147.4 lbs Muscle Mass (lbs): 169.4 lbs Visceral Fat Rating : 31   Other Clinical Data Fasting: no Labs: no Today's Visit #: 32 Starting Date: 01/28/21     ASSESSMENT AND PLAN:  Diet: Effrem is currently in the action stage of change. As such, his goal is to continue with weight loss efforts. He has agreed to Category 4 Plan.  Exercise: [name] has been instructed that some exercise is better than none for weight loss and overall health benefits.   Behavior Modification:  We discussed the following Behavioral Modification Strategies today: increasing lean protein intake, increasing vegetables, decreasing eating out, keeping healthy foods in the home, and planning for success.   No follow-ups on file.[name] He was informed of the importance of frequent follow up visits to maximize his success with intensive lifestyle modifications for his multiple health conditions.  Attestation Statements:   Reviewed by clinician on day of visit: allergies, medications, problem list, medical history, surgical history, family history, social history, and previous encounter notes.   Time spent on visit including pre-visit  chart review and post-visit care and charting was 25 minutes.    Rudean Hitt, MD

## 2023-09-30 NOTE — Progress Notes (Signed)
 HPI: FU atrial fibrillation.  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  He has had previous cardioversions but did not hold sinus rhythm.  Pt is now treated with rate control and anticoagulation as he is asymptomatic. CTA 7/21 showed no pulmonary embolus. Last echocardiogram December 2022 showed normal LV function, severe left atrial enlargement, mild mitral regurgitation.  Since last seen, the patient denies any dyspnea on exertion, orthopnea, PND, pedal edema, palpitations, syncope or chest pain.   Current Outpatient Medications  Medication Sig Dispense Refill   buPROPion  (WELLBUTRIN  XL) 150 MG 24 hr tablet Take 150 mg by mouth daily.     cholecalciferol (VITAMIN D3) 25 MCG (1000 UNIT) tablet Take 1,000 Units by mouth daily.     diltiazem  (CARDIZEM  CD) 120 MG 24 hr capsule TAKE 1 CAPSULE(120 MG) BY MOUTH DAILY 90 capsule 3   ELIQUIS  5 MG TABS tablet TAKE 1 TABLET(5 MG) BY MOUTH TWICE DAILY 60 tablet 5   furosemide  (LASIX ) 20 MG tablet TAKE 1 TABLET(20 MG) BY MOUTH DAILY 90 tablet 3   metoprolol  (TOPROL -XL) 200 MG 24 hr tablet TAKE 1 TABLET(200 MG) BY MOUTH DAILY 90 tablet 3   omeprazole (PRILOSEC OTC) 20 MG tablet Take 20 mg by mouth daily before breakfast.      vitamin B-12 (CYANOCOBALAMIN ) 1000 MCG tablet Take 1 tablet (1,000 mcg total) by mouth daily. 90 tablet 0   No current facility-administered medications for this visit.     Past Medical History:  Diagnosis Date   Arthritis    Atrial fibrillation (HCC)    a. s/p multiple DCCV's ---> now rate-controlled. On Eliquis  for anticoagulation   Cardiomyopathy (HCC)    a. EF previously at 25-30% (Tachycardia-mediated?) --> at 55-60% by echo in 03/2016   Cellulitis and abscess of right leg    Diabetes mellitus without complication (HCC)    Dysrhythmia    ED (erectile dysfunction)    Edema of knee    Hypertension    Hypogonadism male    Joint pain    Leg cramp     Muscle pain    Obesity    Obesity    OSA (obstructive sleep apnea)    a. on CPAP   Other fatigue    Pre-diabetes    SOB (shortness of breath)    SOB (shortness of breath) on exertion    Vitamin B12 deficiency    Vitamin D  deficiency     Past Surgical History:  Procedure Laterality Date   APPENDECTOMY  1981   CARDIOVERSION N/A 03/24/2013   Procedure: CARDIOVERSION;  Surgeon: Redell GORMAN Shallow, MD;  Location: [facility] ENDOSCOPY;  Service: Cardiovascular;  Laterality: N/A;   EYE SURGERY  in 1st grade   Left eye   LAPAROSCOPIC GASTRIC SLEEVE RESECTION  8/13   XI ROBOTIC ASSISTED INGUINAL HERNIA REPAIR WITH MESH Right 12/08/2021   Procedure: XI ROBOTIC REPAIR OF RIGHT INCARCERATED INGUINAL HERNIA WITH MESH;  Surgeon: Tanda Locus, MD;  Location: [facility] ORS;  Service: General;  Laterality: Right;    Social History   Socioeconomic History   Marital status: Divorced    Spouse name: [name]   Number of children: N   Years of education: Not on file   Highest education level: Not on file  Occupational History   Occupation: Customer Service Manager: [employer]    Employer: [employer]  Tobacco Use   Smoking status: Never   Smokeless tobacco: Never  Vaping  Use   Vaping status: Never Used  Substance and Sexual Activity   Alcohol use: No   Drug use: No   Sexual activity: Yes  Other Topics Concern   Not on file  Social History Narrative   Got married 08/2010      Regular Exercise- yes         Social Drivers of Corporate Investment Banker Strain: Not on file  Food Insecurity: Not on file  Transportation Needs: Not on file  Physical Activity: Not on file  Stress: Not on file  Social Connections: Not on file  Intimate Partner Violence: Not on file    Family History  Problem Relation Age of Onset   Heart attack Father 70   Heart disease Father 30   Hypertension Father    Heart failure Father    Sudden death Father    Heart attack Other        cousin   Heart disease Other     Stroke Mother    Obesity Mother     ROS: no fevers or chills, productive cough, hemoptysis, dysphasia, odynophagia, melena, hematochezia, dysuria, hematuria, rash, seizure activity, orthopnea, PND, pedal edema, claudication. Remaining systems are negative.  Physical Exam: Well-developed obese in no acute distress.  Skin is warm and dry.  HEENT is normal.  Neck is supple.  Chest is clear to auscultation with normal expansion.  Cardiovascular exam is irregular Abdominal exam nontender or distended. No masses palpated. Extremities show no edema. neuro grossly intact  EKG Interpretation Date/Time:  Tuesday October 12 2023 11:18:04 EST Ventricular Rate:  102 PR Interval:    QRS Duration:  76 QT Interval:  328 QTC Calculation: 427 R Axis:   -14  Text Interpretation: Atrial fibrillation with rapid ventricular response When compared with ECG of 14-Apr-2020 08:05, Questionable change in QRS axis Nonspecific T wave abnormality has replaced inverted T waves in Inferior leads Nonspecific T wave abnormality no longer evident in Lateral leads Confirmed by Pietro Rogue ([id number]) on 10/12/2023 11:20:33 AM    A/P  1 permanent atrial fibrillation-will continue present dose of Cardizem  and Toprol  for rate control.  Continue apixaban .  2 hypertension-blood pressure controlled.  Continue present medical regimen.  3 prior cardiomyopathy-felt to be tachycardia mediated.  LV function normal on most recent study. Will repeat echo.  4 obstructive sleep apnea-continue CPAP.  5 morbid obesity-we again discussed the importance of weight loss.  5 lower extremity edema-continue Lasix .  Rogue Pietro, MD

## 2023-10-12 ENCOUNTER — Ambulatory Visit: Payer: Managed Care, Other (non HMO) | Attending: Cardiology | Admitting: Cardiology

## 2023-10-12 ENCOUNTER — Encounter: Payer: Self-pay | Admitting: Cardiology

## 2023-10-12 VITALS — BP 122/76 | HR 102 | Ht 70.0 in | Wt 331.0 lb

## 2023-10-12 DIAGNOSIS — I10 Essential (primary) hypertension: Secondary | ICD-10-CM | POA: Diagnosis not present

## 2023-10-12 DIAGNOSIS — I420 Dilated cardiomyopathy: Secondary | ICD-10-CM | POA: Diagnosis not present

## 2023-10-12 DIAGNOSIS — G4733 Obstructive sleep apnea (adult) (pediatric): Secondary | ICD-10-CM | POA: Diagnosis not present

## 2023-10-12 DIAGNOSIS — I4891 Unspecified atrial fibrillation: Secondary | ICD-10-CM | POA: Diagnosis not present

## 2023-10-12 NOTE — Patient Instructions (Signed)
    Testing/Procedures:  Your physician has requested that you have an echocardiogram. Echocardiography is a painless test that uses sound waves to create images of your heart. It provides your doctor with information about the size and shape of your heart and how well your heart's chambers and valves are working. This procedure takes approximately one hour. There are no restrictions for this procedure. Please do NOT wear cologne, perfume, aftershave, or lotions (deodorant is allowed). Please arrive 15 minutes prior to your appointment time.  Please note: We ask at that you not bring children with you during ultrasound (echo/ vascular) testing. Due to room size and safety concerns, children are not allowed in the ultrasound rooms during exams. Our front office staff cannot provide observation of children in our lobby area while testing is being conducted. An adult accompanying a patient to their appointment will only be allowed in the ultrasound room at the discretion of the ultrasound technician under special circumstances. We apologize for any inconvenience. [address]   Follow-Up: At [facility], you and your health needs are our priority.  As part of our continuing mission to provide you with exceptional heart care, we have created designated Provider Care Teams.  These Care Teams include your primary Cardiologist (physician) and Advanced Practice Providers (APPs -  Physician Assistants and Nurse Practitioners) who all work together to provide you with the care you need, when you need it.  We recommend signing up for the patient portal called "MyChart".  Sign up information is provided on this After Visit Summary.  MyChart is used to connect with patients for Virtual Visits (Telemedicine).  Patients are able to view lab/test results, encounter notes, upcoming appointments, etc.  Non-urgent messages can be sent to your provider as well.   To learn more about what you can do  with MyChart, go to [email or web address].    Your next appointment:   12 month(s)  Provider:   Olga Millers, MD

## 2023-10-18 ENCOUNTER — Encounter (INDEPENDENT_AMBULATORY_CARE_PROVIDER_SITE_OTHER): Payer: Self-pay | Admitting: Family Medicine

## 2023-10-18 ENCOUNTER — Ambulatory Visit (INDEPENDENT_AMBULATORY_CARE_PROVIDER_SITE_OTHER): Payer: Managed Care, Other (non HMO) | Admitting: Family Medicine

## 2023-10-18 VITALS — BP 115/75 | HR 95 | Temp 98.3°F | Ht 70.0 in | Wt 326.0 lb

## 2023-10-18 DIAGNOSIS — E669 Obesity, unspecified: Secondary | ICD-10-CM

## 2023-10-18 DIAGNOSIS — R7303 Prediabetes: Secondary | ICD-10-CM | POA: Diagnosis not present

## 2023-10-18 DIAGNOSIS — G4733 Obstructive sleep apnea (adult) (pediatric): Secondary | ICD-10-CM | POA: Diagnosis not present

## 2023-10-18 DIAGNOSIS — Z6841 Body Mass Index (BMI) 40.0 and over, adult: Secondary | ICD-10-CM | POA: Diagnosis not present

## 2023-10-18 NOTE — Assessment & Plan Note (Signed)
 NPSG 2012:  AHI 38/hr with desat to 74% NPSG 05/06/20- AHI 22.4/ hr, desaturation to 67%,  CPAP to 12, body weight 366 lbs  Discussed usage of GLP-1/GIP for further treatment.  We discussed possible side effects and risk and benefits of medication  Patient to think about medication over the next 4 weeks.

## 2023-10-18 NOTE — Progress Notes (Signed)
   SUBJECTIVE:  Chief Complaint: Obesity  Interim History: Patient's brother died a month ago.  Service has been done and his niece is in charge of his brother's estate.  He wants to recommit to plan.  He realizes he lapsed back into drinking tea daily when he got home from work.  He wants to get back to the meal plan at night and cooking instead of ordering out or picking up.  Food is back in the house for the most part, he just needs to pick up turkey and cheese for lunches.  He is planning to be off work in a couple of weeks.   Dakota Hernandez is here to discuss his progress with his obesity treatment plan. He is on the Category 4 Plan and states he is following his eating plan approximately 50 % of the time. He states he is not exercising, but was working a lot.   OBJECTIVE: Visit Diagnoses: Problem List Items Addressed This Visit       Respiratory   OSA (obstructive sleep apnea) - Primary   NPSG 2012:  AHI 38/hr with desat to 74% NPSG 05/06/20- AHI 22.4/ hr, desaturation to 67%,  CPAP to 12, body weight 366 lbs  Discussed usage of GLP-1/GIP for further treatment.  We discussed possible side effects and risk and benefits of medication  Patient to think about medication over the next 4 weeks.        Other   Prediabetes   Last A1c of 6.0 at the end of October 2024.  Patient has been working on lifestyle changes that include nutrition and physical activity changes.  He has struggled a bit over the holidays and staying consistent with his food intake choices.  We will need to repeat labs at next appointment.  Given his history of OSA he may benefit from incretin therapy for both OSA and prediabetes.       No data recorded  No data recorded  No data recorded  No data recorded    ASSESSMENT AND PLAN:  Diet: Dakota Hernandez is currently in the action stage of change. As such, his goal is to continue with weight loss efforts. He has agreed to Category 4 Plan.  Exercise: Dakota Hernandez has been  instructed that some exercise is better than none for weight loss and overall health benefits.   Behavior Modification:  We discussed the following Behavioral Modification Strategies today: increasing lean protein intake, increasing vegetables, no skipping meals, meal planning and cooking strategies, keeping healthy foods in the home, and planning for success.   Return in about 4 weeks (around 11/15/2023) for fasting labs and IC.SABRA He was informed of the importance of frequent follow up visits to maximize his success with intensive lifestyle modifications for his multiple health conditions.  Attestation Statements:   Reviewed by clinician on day of visit: allergies, medications, problem list, medical history, surgical history, family history, social history, and previous encounter notes.    Adelita Cho, MD

## 2023-10-22 NOTE — Assessment & Plan Note (Signed)
Last A1c of 6.0 at the end of October 2024.  Patient has been working on lifestyle changes that include nutrition and physical activity changes.  He has struggled a bit over the holidays and staying consistent with his food intake choices.  We will need to repeat labs at next appointment.  Given his history of OSA he may benefit from incretin therapy for both OSA and prediabetes.

## 2023-10-28 ENCOUNTER — Other Ambulatory Visit: Payer: Self-pay | Admitting: Cardiology

## 2023-10-28 ENCOUNTER — Ambulatory Visit (HOSPITAL_COMMUNITY): Payer: Managed Care, Other (non HMO) | Attending: Cardiology

## 2023-10-28 DIAGNOSIS — I4891 Unspecified atrial fibrillation: Secondary | ICD-10-CM

## 2023-10-28 LAB — ECHOCARDIOGRAM COMPLETE: S' Lateral: 4.4 cm

## 2023-10-28 NOTE — Telephone Encounter (Signed)
Eliquis 5mg  refill request received. Patient is 58 years old, weight-147.9kg, Crea-1.32 on 08/05/23, Diagnosis-Afib, and last seen by Dr. Jens Som on 10/12/23. Dose is appropriate based on dosing criteria. Will send in refill to requested pharmacy.

## 2023-10-29 ENCOUNTER — Encounter: Payer: Self-pay | Admitting: *Deleted

## 2023-11-15 ENCOUNTER — Encounter (INDEPENDENT_AMBULATORY_CARE_PROVIDER_SITE_OTHER): Payer: Self-pay | Admitting: Family Medicine

## 2023-11-15 ENCOUNTER — Ambulatory Visit (INDEPENDENT_AMBULATORY_CARE_PROVIDER_SITE_OTHER): Payer: Managed Care, Other (non HMO) | Admitting: Family Medicine

## 2023-11-15 VITALS — BP 150/104 | HR 78 | Temp 97.6°F | Ht 70.0 in | Wt 330.0 lb

## 2023-11-15 DIAGNOSIS — R7303 Prediabetes: Secondary | ICD-10-CM

## 2023-11-15 DIAGNOSIS — I10 Essential (primary) hypertension: Secondary | ICD-10-CM | POA: Diagnosis not present

## 2023-11-15 DIAGNOSIS — G4733 Obstructive sleep apnea (adult) (pediatric): Secondary | ICD-10-CM

## 2023-11-15 DIAGNOSIS — Z6841 Body Mass Index (BMI) 40.0 and over, adult: Secondary | ICD-10-CM

## 2023-11-15 DIAGNOSIS — E785 Hyperlipidemia, unspecified: Secondary | ICD-10-CM | POA: Diagnosis not present

## 2023-11-15 DIAGNOSIS — E7849 Other hyperlipidemia: Secondary | ICD-10-CM

## 2023-11-15 MED ORDER — ZEPBOUND 2.5 MG/0.5ML ~~LOC~~ SOAJ: 2.5000 mg | SUBCUTANEOUS | 0 refills | Status: DC

## 2023-11-15 NOTE — Progress Notes (Signed)
SUBJECTIVE:  Chief Complaint: Obesity  Interim History: Patient reports that he has been following meal plan at lunch and dinner is hit or miss.  He did fall into ordering out at dinner more and that is a habit he previously had broken.  He finds that if he doesn't prep food he feels rushed and often opts to order out.  He also realizes he isn't sticking to calories and protein when he is ordering out.   Dakota Hernandez is here to discuss his progress with his obesity treatment plan. He is on the Category 4 Plan and states he is following his eating plan approximately 60-65 % of the time. He states he is not exercising, but is working.   OBJECTIVE: Visit Diagnoses: Problem List Items Addressed This Visit       Cardiovascular and Mediastinum   Essential hypertension   Patient is on combination therapy with diltiazem, Toprol, Lasix.  He has not had any dose adjustment since last appointment.  Will repeat electrolyte panel today to assess electrolytes, kidney function.      Relevant Orders   Comprehensive metabolic panel (Completed)     Respiratory   OSA (obstructive sleep apnea) - Primary   NPSG 2012:  AHI 38/hr with desat to 74% NPSG 05/06/20- AHI 22.4/ hr, desaturation to 67%,  CPAP to 12, body weight 366 lbs Patient is agreeable to treatment with GLP-1/GIP (Zepbound).  Will send in prescription at starting dose and follow up on Prior Authorization to see if covered by insurance.      Relevant Medications   tirzepatide (ZEPBOUND) 2.5 MG/0.5ML Pen     Other   OBESITY, MORBID   Starting weight: 382 on 01/28/21 BMR: 2973 on 01/28/21; 2662 on 11/15/23 Previous obesity management: s/p gastric sleeve in 2013 Body Fat %: 44.2% Starting Meal Plan: Category 4 with journaling for supper Meal Plan needs: easy option for breakfast/ eating out option for breakfast  Patient was able to lose 2-96 and program.  He has struggled with consistency and compliance to meal plan since that time.  He is  agreeable to trying to see if Zepbound is covered for his OSA.      Relevant Medications   tirzepatide (ZEPBOUND) 2.5 MG/0.5ML Pen   Prediabetes   Last A1c was 6.0 in October 2024.  Patient's low A1c was over a year ago at 5.0.  Since that time he has been slightly less adherent to the meal plan.  We need a repeat A1c today as well as fasting insulin level.  Will plan to discuss labs at next appointment.      Relevant Orders   Hemoglobin A1c (Completed)   Insulin, random (Completed)   HLD (hyperlipidemia)   Previous HDL low, triglycerides and LDL within normal limits.  However patient has struggled with staying consistent on his meal plan over the last few months.  He voices he has been eating out more than he previously did.  Need a repeat fasting lipid panel at this time.      Relevant Orders   Lipid Panel With LDL/HDL Ratio (Completed)      11/15/2023    8:00 AM 11/15/2023    7:00 AM 10/18/2023    3:00 PM  Vitals with BMI  Height  5\' 10"  5\' 10"   Weight  330 lbs 326 lbs  BMI  47.35 46.78  Systolic 150 145 161  Diastolic 104 100 75  Pulse  78 95    Vitals Temp: 97.6 F (36.4 C)  BP: (!) 150/104 Pulse Rate: 78 SpO2: 98 %   Anthropometric Measurements Height: 5\' 10"  (1.778 m) Weight: (!) 330 lb (149.7 kg) BMI (Calculated): 47.35 Weight at Last Visit: 326 lb Weight Lost Since Last Visit: 0 Weight Gained Since Last Visit: 4 lb Starting Weight: 382 lb Total Weight Loss (lbs): 52 lb (23.6 kg)   Body Composition  Body Fat %: 44.2 % Fat Mass (lbs): 146.2 lbs Muscle Mass (lbs): 175.6 lbs Total Body Water (lbs): 146.8 lbs Visceral Fat Rating : 31   Other Clinical Data RMR: [id number] Today's Visit #: 34 Starting Date: 01/28/21     ASSESSMENT AND PLAN:  Diet: Dakota Hernandez is currently in the action stage of change. As such, his goal is to continue with weight loss efforts. He has agreed to Category 4 Plan.   Exercise: Dakota Hernandez has been instructed that some exercise is  better than none for weight loss and overall health benefits.   Behavior Modification:  We discussed the following Behavioral Modification Strategies today: increasing lean protein intake, increasing vegetables, increase H2O intake, and meal planning and cooking strategies.  Wants to prep and prepare 3 days worth of meals to set himself up to not fall into habit of ordering out.  He wants to stop nighttime snacking.   No follow-ups on file.[name] He was informed of the importance of frequent follow up visits to maximize his success with intensive lifestyle modifications for his multiple health conditions.  Attestation Statements:   Reviewed by clinician on day of visit: allergies, medications, problem list, medical history, surgical history, family history, social history, and previous encounter notes.    Reuben Likes, MD

## 2023-11-15 NOTE — Assessment & Plan Note (Signed)
 NPSG 2012:  AHI 38/hr with desat to 74% NPSG 05/06/20- AHI 22.4/ hr, desaturation to 67%,  CPAP to 12, body weight 366 lbs Patient is agreeable to treatment with GLP-1/GIP (Zepbound ).  Will send in prescription at starting dose and follow up on Prior Authorization to see if covered by insurance.

## 2023-11-16 ENCOUNTER — Encounter (INDEPENDENT_AMBULATORY_CARE_PROVIDER_SITE_OTHER): Payer: Self-pay

## 2023-11-16 DIAGNOSIS — E785 Hyperlipidemia, unspecified: Secondary | ICD-10-CM | POA: Insufficient documentation

## 2023-11-16 LAB — LIPID PANEL WITH LDL/HDL RATIO
Cholesterol, Total: 169 mg/dL (ref 100–199)
HDL: 55 mg/dL (ref >39)
LDL Chol Calc (NIH): 103 mg/dL — ABNORMAL HIGH (ref 0–99)
LDL/HDL Ratio: 1.9 {ratio} (ref 0.0–3.6)
Triglycerides: 57 mg/dL (ref 0–149)
VLDL Cholesterol Cal: 11 mg/dL (ref 5–40)

## 2023-11-16 LAB — INSULIN, RANDOM: INSULIN: 13.9 u[IU]/mL (ref 2.6–24.9)

## 2023-11-16 LAB — COMPREHENSIVE METABOLIC PANEL
ALT: 10 [IU]/L (ref 0–44)
AST: 15 [IU]/L (ref 0–40)
Albumin: 3.9 g/dL (ref 3.8–4.9)
Alkaline Phosphatase: 83 [IU]/L (ref 44–121)
BUN/Creatinine Ratio: 18 (ref 9–20)
BUN: 19 mg/dL (ref 6–24)
Bilirubin Total: 0.8 mg/dL (ref 0.0–1.2)
CO2: 20 mmol/L (ref 20–29)
Calcium: 8.8 mg/dL (ref 8.7–10.2)
Chloride: 107 mmol/L — ABNORMAL HIGH (ref 96–106)
Creatinine, Ser: 1.06 mg/dL (ref 0.76–1.27)
Globulin, Total: 2.6 g/dL (ref 1.5–4.5)
Glucose: 91 mg/dL (ref 70–99)
Potassium: 4.4 mmol/L (ref 3.5–5.2)
Sodium: 145 mmol/L — ABNORMAL HIGH (ref 134–144)
Total Protein: 6.5 g/dL (ref 6.0–8.5)
eGFR: 82 mL/min/{1.73_m2} (ref >59)

## 2023-11-16 LAB — HEMOGLOBIN A1C
Est. average glucose Bld gHb Est-mCnc: 126 mg/dL
Hgb A1c MFr Bld: 6 % — ABNORMAL HIGH (ref 4.8–5.6)

## 2023-11-16 NOTE — Assessment & Plan Note (Signed)
Previous HDL low, triglycerides and LDL within normal limits.  However patient has struggled with staying consistent on his meal plan over the last few months.  He voices he has been eating out more than he previously did.  Need a repeat fasting lipid panel at this time.

## 2023-11-16 NOTE — Assessment & Plan Note (Signed)
Last A1c was 6.0 in October 2024.  Patient's low A1c was over a year ago at 5.0.  Since that time he has been slightly less adherent to the meal plan.  We need a repeat A1c today as well as fasting insulin level.  Will plan to discuss labs at next appointment.

## 2023-11-16 NOTE — Assessment & Plan Note (Signed)
Starting weight: 382 on 01/28/21 BMR: 2973 on 01/28/21; 2662 on 11/15/23 Previous obesity management: s/p gastric sleeve in 2013 Body Fat %: 44.2% Starting Meal Plan: Category 4 with journaling for supper Meal Plan needs: easy option for breakfast/ eating out option for breakfast  Patient was able to lose 2-96 and program.  He has struggled with consistency and compliance to meal plan since that time.  He is agreeable to trying to see if Zepbound is covered for his OSA.

## 2023-11-16 NOTE — Assessment & Plan Note (Signed)
Patient is on combination therapy with diltiazem, Toprol, Lasix.  He has not had any dose adjustment since last appointment.  Will repeat electrolyte panel today to assess electrolytes, kidney function.

## 2023-11-23 ENCOUNTER — Encounter (INDEPENDENT_AMBULATORY_CARE_PROVIDER_SITE_OTHER): Payer: Self-pay | Admitting: Family Medicine

## 2023-11-23 NOTE — Telephone Encounter (Signed)
 Please advise

## 2023-12-14 ENCOUNTER — Ambulatory Visit (INDEPENDENT_AMBULATORY_CARE_PROVIDER_SITE_OTHER): Payer: Managed Care, Other (non HMO) | Admitting: Family Medicine

## 2023-12-14 ENCOUNTER — Encounter (INDEPENDENT_AMBULATORY_CARE_PROVIDER_SITE_OTHER): Payer: Self-pay | Admitting: Family Medicine

## 2023-12-14 VITALS — BP 107/77 | HR 99 | Temp 98.7°F | Ht 70.0 in | Wt 316.0 lb

## 2023-12-14 DIAGNOSIS — I10 Essential (primary) hypertension: Secondary | ICD-10-CM

## 2023-12-14 DIAGNOSIS — G4733 Obstructive sleep apnea (adult) (pediatric): Secondary | ICD-10-CM

## 2023-12-14 DIAGNOSIS — Z6841 Body Mass Index (BMI) 40.0 and over, adult: Secondary | ICD-10-CM

## 2023-12-14 MED ORDER — ZEPBOUND 2.5 MG/0.5ML ~~LOC~~ SOAJ: 2.5000 mg | SUBCUTANEOUS | 0 refills | Status: DC

## 2023-12-14 NOTE — Assessment & Plan Note (Signed)
 See anthropometric measurements.   Continue Category 4.
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
## 2023-12-14 NOTE — Assessment & Plan Note (Signed)
 Blood pressure very well controlled today- contributing factor of recent loss of 14lbs. Continue current meds as patient is not having any symptoms of chest pain, chest pressure or headache.  Follow up on BP at next appointment.

## 2023-12-14 NOTE — Assessment & Plan Note (Addendum)
 Doing very well on Zepbound current dose of 2.5mg .  Minimal side effects.  Will need to increase to 5mg  dose per insurance coverage dose coverage limitations

## 2023-12-20 MED ORDER — ZEPBOUND 5 MG/0.5ML ~~LOC~~ SOAJ
5.0000 mg | SUBCUTANEOUS | 0 refills | Status: DC
Start: 2023-12-20 — End: 2024-01-12

## 2023-12-20 NOTE — Addendum Note (Signed)
 Addended by: Langston Reusing on: 12/20/2023 12:11 PM   Modules accepted: Orders

## 2024-01-12 ENCOUNTER — Encounter (INDEPENDENT_AMBULATORY_CARE_PROVIDER_SITE_OTHER): Payer: Self-pay | Admitting: Family Medicine

## 2024-01-12 ENCOUNTER — Ambulatory Visit (INDEPENDENT_AMBULATORY_CARE_PROVIDER_SITE_OTHER): Admitting: Family Medicine

## 2024-01-12 VITALS — BP 138/84 | HR 87 | Temp 97.6°F | Ht 70.0 in | Wt 301.0 lb

## 2024-01-12 DIAGNOSIS — Z6841 Body Mass Index (BMI) 40.0 and over, adult: Secondary | ICD-10-CM | POA: Diagnosis not present

## 2024-01-12 DIAGNOSIS — G4733 Obstructive sleep apnea (adult) (pediatric): Secondary | ICD-10-CM

## 2024-01-12 DIAGNOSIS — E349 Endocrine disorder, unspecified: Secondary | ICD-10-CM | POA: Insufficient documentation

## 2024-01-12 MED ORDER — ZEPBOUND 5 MG/0.5ML ~~LOC~~ SOAJ: 5.0000 mg | SUBCUTANEOUS | 0 refills | Status: DC

## 2024-01-12 NOTE — Assessment & Plan Note (Signed)
 Testosterone level is low on test done in October by PCP.  Patient voices he has not thought much about further investigation or treatment for this.  He was encouraged to reach out to PCP for further management as this could increase his energy and improve mood.

## 2024-01-12 NOTE — Assessment & Plan Note (Signed)
 Patient has noticed improved sleep with weight loss on Zepbound and CPAP usage.  Needs refill of Zepbound today and is agreeable to staying on the 5mg  dose.  No significant side effects mentioned.

## 2024-01-12 NOTE — Progress Notes (Signed)
 SUBJECTIVE:  Chief Complaint: Obesity  Interim History: Patient has been mostly working since last appointment.  He is taking the week off next week and doing a staycation.  He is looking to go somewhere in September and is planning to meet with a travel agent to make a payment plan.  Foodwise he has been following the plan 75-80% of the time.  He has been limiting his [employer] ordering and has been eating at home more consistently.  He thinks he can implement some activity while home next week.  Decreased drive to snack and feeling satiated for longer periods of time.  Dakota Hernandez is here to discuss his progress with his obesity treatment plan. He is on the Category 4 Plan and states he is following his eating plan approximately 75-80 % of the time. He states he is not exercising, but is working.    OBJECTIVE: Visit Diagnoses: Problem List Items Addressed This Visit       Respiratory   OSA (obstructive sleep apnea) - Primary   Patient has noticed improved sleep with weight loss on Zepbound and CPAP usage.  Needs refill of Zepbound today and is agreeable to staying on the 5mg  dose.  No significant side effects mentioned.      Relevant Medications   tirzepatide (ZEPBOUND) 5 MG/0.5ML Pen     Other   OBESITY, MORBID   Relevant Medications   tirzepatide (ZEPBOUND) 5 MG/0.5ML Pen   Testosterone deficiency   Testosterone level is low on test done in October by PCP.  Patient voices he has not thought much about further investigation or treatment for this.  He was encouraged to reach out to PCP for further management as this could increase his energy and improve mood.      Other Visit Diagnoses       BMI 40.0-44.9, adult (HCC)       Relevant Medications   tirzepatide (ZEPBOUND) 5 MG/0.5ML Pen       Vitals Temp: 97.6 F (36.4 C) BP: 138/84 Pulse Rate: 87 SpO2: 98 %   Anthropometric Measurements Height: 5\' 10"  (1.778 m) Weight: (!) 301 lb (136.5 kg) BMI (Calculated):  43.19 Weight at Last Visit: 316 lb Weight Lost Since Last Visit: 15 lb Weight Gained Since Last Visit: 0 Starting Weight: 382 lb Total Weight Loss (lbs): 81 lb (36.7 kg)   Body Composition  Body Fat %: 41.4 % Fat Mass (lbs): 124.8 lbs Muscle Mass (lbs): 168.2 lbs Total Body Water (lbs): 132.6 lbs Visceral Fat Rating : 27   Other Clinical Data Today's Visit #: 36 Starting Date: 01/28/21 Comments: Cat 4     ASSESSMENT AND PLAN:  Diet: Dakota Hernandez is currently in the action stage of change. As such, his goal is to continue with weight loss efforts and has agreed to the Category 4 Plan.   Exercise:  For substantial health benefits, adults should do at least 150 minutes (2 hours and 30 minutes) a week of moderate-intensity, or 75 minutes (1 hour and 15 minutes) a week of vigorous-intensity aerobic physical activity, or an equivalent combination of moderate- and vigorous-intensity aerobic activity. Aerobic activity should be performed in episodes of at least 10 minutes, and preferably, it should be spread throughout the week.  Behavior Modification:  We discussed the following Behavioral Modification Strategies today: increasing lean protein intake, decreasing simple carbohydrates, decreasing eating out, meal planning and cooking strategies, and planning for success.   Return in about 4 weeks (around 02/09/2024).[name] He was informed of  the importance of frequent follow up visits to maximize his success with intensive lifestyle modifications for his multiple health conditions.  Attestation Statements:   Reviewed by clinician on day of visit: allergies, medications, problem list, medical history, surgical history, family history, social history, and previous encounter notes.     Reuben Likes, MD

## 2024-01-17 ENCOUNTER — Other Ambulatory Visit (INDEPENDENT_AMBULATORY_CARE_PROVIDER_SITE_OTHER): Payer: Self-pay | Admitting: Family Medicine

## 2024-01-17 DIAGNOSIS — G4733 Obstructive sleep apnea (adult) (pediatric): Secondary | ICD-10-CM

## 2024-02-12 ENCOUNTER — Encounter (INDEPENDENT_AMBULATORY_CARE_PROVIDER_SITE_OTHER): Payer: Self-pay | Admitting: Family Medicine

## 2024-02-14 ENCOUNTER — Other Ambulatory Visit (INDEPENDENT_AMBULATORY_CARE_PROVIDER_SITE_OTHER): Payer: Self-pay

## 2024-02-14 DIAGNOSIS — G4733 Obstructive sleep apnea (adult) (pediatric): Secondary | ICD-10-CM

## 2024-02-14 MED ORDER — ZEPBOUND 5 MG/0.5ML ~~LOC~~ SOAJ: 5.0000 mg | SUBCUTANEOUS | 0 refills | Status: DC

## 2024-02-23 ENCOUNTER — Ambulatory Visit (INDEPENDENT_AMBULATORY_CARE_PROVIDER_SITE_OTHER): Admitting: Family Medicine

## 2024-02-23 ENCOUNTER — Encounter (INDEPENDENT_AMBULATORY_CARE_PROVIDER_SITE_OTHER): Payer: Self-pay | Admitting: Family Medicine

## 2024-02-23 DIAGNOSIS — G4733 Obstructive sleep apnea (adult) (pediatric): Secondary | ICD-10-CM | POA: Diagnosis not present

## 2024-02-23 DIAGNOSIS — Z6841 Body Mass Index (BMI) 40.0 and over, adult: Secondary | ICD-10-CM

## 2024-02-23 DIAGNOSIS — E349 Endocrine disorder, unspecified: Secondary | ICD-10-CM

## 2024-02-23 MED ORDER — ZEPBOUND 5 MG/0.5ML ~~LOC~~ SOAJ: 5.0000 mg | SUBCUTANEOUS | 0 refills | Status: DC

## 2024-02-23 NOTE — Progress Notes (Signed)
 SUBJECTIVE:  Chief Complaint: Obesity  Interim History: Patient mentions he found himself eating more hot dogs recently and didn't do as well as he could have on the plan.  He is still occasionally eating out and some days he isn't eating a full meal for dinner and then snacks before bed.  Over the next few weeks doesn't have much in terms of events or activities except for work.  Realistic changes that can be made over the next few weeks is cutting out hot dogs and eating less take out.  He needs to figure out breakfast as he doesn't feel like he is getting enough protein in the am.    Dakota Hernandez is here to discuss his progress with his obesity treatment plan. He is on the Category 4 Plan and states he is following his eating plan approximately 55-60 % of the time. He states he is walking 1 mile  2-7 times per week while on vacation.   OBJECTIVE: Visit Diagnoses: Problem List Items Addressed This Visit       Respiratory   OSA (obstructive sleep apnea)   Patient is using CPAP nightly with improvement in symptoms even more so now that he is on zepbound .  Needs a refill of zepbound  today.  Will refill and reassess dosage at next appointment.      Relevant Medications   tirzepatide  (ZEPBOUND ) 5 MG/0.5ML Pen     Other   OBESITY, MORBID - Primary   Relevant Medications   tirzepatide  (ZEPBOUND ) 5 MG/0.5ML Pen   Testosterone  deficiency   Patient had testosterone  tested by Dr. Georgia Kipper but did not follow up on what treatment would be for deficiency.  He mentions he will call his doctor's office to schedule an appointment prior to next appointment here.      Other Visit Diagnoses       BMI 40.0-44.9, adult (HCC)       Relevant Medications   tirzepatide  (ZEPBOUND ) 5 MG/0.5ML Pen       Vitals Temp: 97.9 F (36.6 C) BP: 93/63 Pulse Rate: 61 SpO2: 98 %   Anthropometric Measurements Height: 5\' 10"  (1.778 m) Weight: 293 lb (132.9 kg) BMI (Calculated): 42.04 Weight at Last Visit:  301 lb Weight Lost Since Last Visit: 8 lb Starting Weight: 382 lb Total Weight Loss (lbs): 89 lb (40.4 kg)   Body Composition  Body Fat %: 41.6 % Fat Mass (lbs): 122.2 lbs Muscle Mass (lbs): 163 lbs Total Body Water (lbs): 139.2 lbs Visceral Fat Rating : 26   Other Clinical Data Today's Visit #: 37 Starting Date: 01/28/21 Comments: Cat 4     ASSESSMENT AND PLAN:  Diet: Dakota Hernandez is currently in the action stage of change. As such, his goal is to continue with weight loss efforts and has agreed to the Category 4 Plan.   Exercise:  For substantial health benefits, adults should do at least 150 minutes (2 hours and 30 minutes) a week of moderate-intensity, or 75 minutes (1 hour and 15 minutes) a week of vigorous-intensity aerobic physical activity, or an equivalent combination of moderate- and vigorous-intensity aerobic activity. Aerobic activity should be performed in episodes of at least 10 minutes, and preferably, it should be spread throughout the week.  Behavior Modification:  We discussed the following Behavioral Modification Strategies today: increasing lean protein intake, decreasing eating out, meal planning and cooking strategies, and keeping healthy foods in the home.   Return in about 6 weeks (around 04/05/2024).   He was informed of the  importance of frequent follow up visits to maximize his success with intensive lifestyle modifications for his multiple health conditions.  Attestation Statements:   Reviewed by clinician on day of visit: allergies, medications, problem list, medical history, surgical history, family history, social history, and previous encounter notes.     Dakota Frizzle, MD

## 2024-02-23 NOTE — Assessment & Plan Note (Signed)
 Patient had testosterone  tested by Dr. Georgia Kipper but did not follow up on what treatment would be for deficiency.  He mentions he will call his doctor's office to schedule an appointment prior to next appointment here.

## 2024-02-23 NOTE — Assessment & Plan Note (Signed)
 Patient is using CPAP nightly with improvement in symptoms even more so now that he is on zepbound .  Needs a refill of zepbound  today.  Will refill and reassess dosage at next appointment.

## 2024-03-08 ENCOUNTER — Encounter: Payer: Self-pay | Admitting: Internal Medicine

## 2024-03-08 ENCOUNTER — Ambulatory Visit: Admitting: Internal Medicine

## 2024-03-08 VITALS — BP 112/72 | HR 62 | Temp 98.3°F | Ht 70.0 in | Wt 293.0 lb

## 2024-03-08 DIAGNOSIS — E291 Testicular hypofunction: Secondary | ICD-10-CM

## 2024-03-08 DIAGNOSIS — N320 Bladder-neck obstruction: Secondary | ICD-10-CM

## 2024-03-08 DIAGNOSIS — E349 Endocrine disorder, unspecified: Secondary | ICD-10-CM

## 2024-03-08 DIAGNOSIS — I10 Essential (primary) hypertension: Secondary | ICD-10-CM

## 2024-03-08 DIAGNOSIS — R5383 Other fatigue: Secondary | ICD-10-CM | POA: Diagnosis not present

## 2024-03-08 DIAGNOSIS — Z6841 Body Mass Index (BMI) 40.0 and over, adult: Secondary | ICD-10-CM

## 2024-03-08 DIAGNOSIS — E559 Vitamin D deficiency, unspecified: Secondary | ICD-10-CM

## 2024-03-08 DIAGNOSIS — I420 Dilated cardiomyopathy: Secondary | ICD-10-CM

## 2024-03-08 DIAGNOSIS — E538 Deficiency of other specified B group vitamins: Secondary | ICD-10-CM

## 2024-03-08 DIAGNOSIS — K029 Dental caries, unspecified: Secondary | ICD-10-CM

## 2024-03-08 MED ORDER — TESTOSTERONE 50 MG/5GM (1%) TD GEL: 5.0000 g | Freq: Every day | TRANSDERMAL | 5 refills | Status: DC

## 2024-03-08 NOTE — Progress Notes (Signed)
 Subjective:  Patient ID: Dakota Hernandez, male    DOB: 18-Oct-1965  Age: 58 y.o. MRN: [id number]  CC: Medical Management of Chronic Issues (Discuss labs and possible treatment, Discuss referrals)   HPI Manfred Seed Beller presents for Hypogonadism, GERD, obesity, anticoagulation   Outpatient Medications Prior to Visit  Medication Sig Dispense Refill   albuterol (VENTOLIN HFA) 108 (90 Base) MCG/ACT inhaler Inhale 2 puffs into the lungs.     cholecalciferol (VITAMIN D3) 25 MCG (1000 UNIT) tablet Take 1,000 Units by mouth daily.     diltiazem  (CARDIZEM  CD) 120 MG 24 hr capsule TAKE 1 CAPSULE(120 MG) BY MOUTH DAILY 90 capsule 3   ELIQUIS  5 MG TABS tablet TAKE 1 TABLET(5 MG) BY MOUTH TWICE DAILY 60 tablet 5   furosemide  (LASIX ) 20 MG tablet TAKE 1 TABLET(20 MG) BY MOUTH DAILY 90 tablet 3   metoprolol  (TOPROL -XL) 200 MG 24 hr tablet TAKE 1 TABLET(200 MG) BY MOUTH DAILY 90 tablet 3   omeprazole (PRILOSEC OTC) 20 MG tablet Take 20 mg by mouth daily before breakfast.      tirzepatide  (ZEPBOUND ) 5 MG/0.5ML Pen Inject 5 mg into the skin once a week. 2 mL 0   vitamin B-12 (CYANOCOBALAMIN ) 1000 MCG tablet Take 1 tablet (1,000 mcg total) by mouth daily. 90 tablet 0   No facility-administered medications prior to visit.    ROS: Review of Systems  Constitutional:  Positive for fatigue. Negative for appetite change and unexpected weight change.  HENT:  Negative for congestion, nosebleeds, sneezing, sore throat and trouble swallowing.   Eyes:  Negative for itching and visual disturbance.  Respiratory:  Negative for cough.   Cardiovascular:  Negative for chest pain, palpitations and leg swelling.  Gastrointestinal:  Negative for abdominal distention, blood in stool, diarrhea and nausea.  Genitourinary:  Negative for frequency and hematuria.  Musculoskeletal:  Negative for back pain, gait problem, joint swelling and neck pain.  Skin:  Negative for rash.  Neurological:  Negative for dizziness,  tremors, speech difficulty and weakness.  Psychiatric/Behavioral:  Negative for agitation, dysphoric mood, sleep disturbance and suicidal ideas. The patient is nervous/anxious.     Objective:  BP 112/72   Pulse 62   Temp 98.3 F (36.8 C) (Oral)   Ht 5' 10 (1.778 m)   Wt 293 lb (132.9 kg)   SpO2 96%   BMI 42.04 kg/m   BP Readings from Last 3 Encounters:  03/08/24 112/72  02/23/24 93/63  01/12/24 138/84    Wt Readings from Last 3 Encounters:  03/08/24 293 lb (132.9 kg)  02/23/24 293 lb (132.9 kg)  01/12/24 (!) 301 lb (136.5 kg)    Physical Exam Constitutional:      General: He is not in acute distress.    Appearance: He is well-developed. He is obese.     Comments: NAD   Eyes:     Conjunctiva/sclera: Conjunctivae normal.     Pupils: Pupils are equal, round, and reactive to light.   Neck:     Thyroid : No thyromegaly.     Vascular: No JVD.   Cardiovascular:     Rate and Rhythm: Normal rate and regular rhythm.     Heart sounds: Normal heart sounds. No murmur heard.    No friction rub. No gallop.  Pulmonary:     Effort: Pulmonary effort is normal. No respiratory distress.     Breath sounds: Normal breath sounds. No wheezing or rales.  Chest:     Chest wall: No tenderness.  Abdominal:     General: Bowel sounds are normal. There is no distension.     Palpations: Abdomen is soft. There is no mass.     Tenderness: There is no abdominal tenderness. There is no guarding or rebound.   Musculoskeletal:        General: No tenderness. Normal range of motion.     Cervical back: Normal range of motion.     Right lower leg: No edema.     Left lower leg: No edema.  Lymphadenopathy:     Cervical: No cervical adenopathy.   Skin:    General: Skin is warm and dry.     Findings: No rash.   Neurological:     Mental Status: He is alert and oriented to person, place, and time.     Cranial Nerves: No cranial nerve deficit.     Motor: No abnormal muscle tone.      Coordination: Coordination normal.     Gait: Gait normal.     Deep Tendon Reflexes: Reflexes are normal and symmetric.   Psychiatric:        Behavior: Behavior normal.        Thought Content: Thought content normal.        Judgment: Judgment normal.   Teeth with caries  Lab Results  Component Value Date   WBC 5.3 08/05/2023   HGB 13.5 08/05/2023   HCT 41.8 08/05/2023   PLT 203.0 08/05/2023   GLUCOSE 91 11/15/2023   CHOL 169 11/15/2023   TRIG 57 11/15/2023   HDL 55 11/15/2023   LDLCALC 103 (H) 11/15/2023   ALT 10 11/15/2023   AST 15 11/15/2023   NA 145 (H) 11/15/2023   K 4.4 11/15/2023   CL 107 (H) 11/15/2023   CREATININE 1.06 11/15/2023   BUN 19 11/15/2023   CO2 20 11/15/2023   TSH 0.74 08/05/2023   PSA 1.63 08/05/2023   INR 1.2 04/14/2020   HGBA1C 6.0 (H) 11/15/2023    DG Chest 2 View Result Date: 03/22/2023 CLINICAL DATA:  Productive Cough EXAM: CHEST - 2 VIEW COMPARISON:  CXR 04/14/20 FINDINGS: The heart size and mediastinal contours are within normal limits. No pleural effusion. No pneumothorax. Hazy opacity at the left lung base could represent atelectasis or infection. The visualized skeletal structures are unremarkable. IMPRESSION: Hazy opacity at the left lung base could represent atelectasis or infection. Electronically Signed   By: Clora Dane M.D.   On: 03/22/2023 07:24    Assessment & Plan:   Problem List Items Addressed This Visit     OBESITY, MORBID   Follow-up with healthy weight management.  Currently on Zepbound  Testosterone  should help too      Essential hypertension   Better w/wt lossCont w/Diltiazem , Toprol , Lasix        Fatigue   Hopefully, testosterone  will help with fatigue      Vitamin D  deficiency   Take vitamin D       Bladder neck obstruction   Relevant Orders   Ambulatory referral to Urology   Vitamin B12 deficiency   Continue with vitamin B12      Congestive dilated cardiomyopathy (HCC)   Cont w/Diltiazem , Toprol ,  Lasix , Eliquis       Testosterone  deficiency   Options to treat were discussed with the patient Risks and benefits discussed Prescribed AndroGel       Caries   The patient was advised to see a dentist      Other Visit Diagnoses       Hypogonadism  in male    -  Primary   Relevant Medications   testosterone  (ANDROGEL ) 50 MG/5GM (1%) GEL   Other Relevant Orders   Testosterone    CBC with Differential/Platelet   Comprehensive metabolic panel with GFR         Meds ordered this encounter  Medications   testosterone  (ANDROGEL ) 50 MG/5GM (1%) GEL    Sig: Place 5 g onto the skin daily.    Dispense:  150 g    Refill:  5      Follow-up: Return in about 3 months (around 06/08/2024) for a follow-up visit.  Anitra Barn, MD

## 2024-03-09 ENCOUNTER — Other Ambulatory Visit (HOSPITAL_COMMUNITY): Payer: Self-pay

## 2024-03-09 ENCOUNTER — Telehealth: Payer: Self-pay

## 2024-03-09 DIAGNOSIS — E291 Testicular hypofunction: Secondary | ICD-10-CM

## 2024-03-09 NOTE — Telephone Encounter (Signed)
 Pharmacy Patient Advocate Encounter   Received notification from CoverMyMeds that prior authorization for Testosterone  50 MG/5GM(1%) gel is required/requested.   Insurance verification completed.   The patient is insured through [employer] .   Per insurance: Prior to treatment, did your patient have TWO pre-treatment serum testosterone  (total or free*) measurements, each taken in the early morning, on two separate days? *Free testosterone  levels are to be measured by equilibrium dialysis assay.*  - we will need to provide this information to insurance in order to get approval.

## 2024-03-14 NOTE — Telephone Encounter (Signed)
 Please ask the patient to come for another testosterone  test for his insurance requirement: The required to be done in the early morning.

## 2024-03-16 NOTE — Telephone Encounter (Signed)
 Called and spoke with patient. Informed him of labs needed from insurance to have medication approved.  Patient expressed understanding and stated he would reach out to his job to see when he would be able to have these drawn

## 2024-03-17 ENCOUNTER — Other Ambulatory Visit (HOSPITAL_COMMUNITY): Payer: Self-pay

## 2024-03-19 DIAGNOSIS — K029 Dental caries, unspecified: Secondary | ICD-10-CM | POA: Insufficient documentation

## 2024-03-19 NOTE — Assessment & Plan Note (Signed)
 The patient was advised to see a dentist.

## 2024-03-19 NOTE — Assessment & Plan Note (Signed)
 Options to treat were discussed with the patient Risks and benefits discussed Prescribed AndroGel 

## 2024-03-19 NOTE — Assessment & Plan Note (Signed)
Better w/wt lossCont w/Diltiazem, Toprol, Lasix

## 2024-03-19 NOTE — Assessment & Plan Note (Signed)
 Hopefully, testosterone  will help with fatigue

## 2024-03-19 NOTE — Assessment & Plan Note (Signed)
Take vitamin D

## 2024-03-19 NOTE — Assessment & Plan Note (Signed)
Cont w/Diltiazem, Toprol, Lasix, Eliquis

## 2024-03-19 NOTE — Assessment & Plan Note (Signed)
 Follow-up with healthy weight management.  Currently on Zepbound  Testosterone  should help too

## 2024-03-19 NOTE — Assessment & Plan Note (Signed)
Continue with vitamin B12 

## 2024-04-04 ENCOUNTER — Telehealth (INDEPENDENT_AMBULATORY_CARE_PROVIDER_SITE_OTHER): Payer: Self-pay

## 2024-04-04 ENCOUNTER — Encounter (INDEPENDENT_AMBULATORY_CARE_PROVIDER_SITE_OTHER): Payer: Self-pay | Admitting: Family Medicine

## 2024-04-04 ENCOUNTER — Ambulatory Visit (INDEPENDENT_AMBULATORY_CARE_PROVIDER_SITE_OTHER): Admitting: Family Medicine

## 2024-04-04 VITALS — BP 110/73 | HR 85 | Temp 97.8°F | Ht 70.0 in | Wt 288.0 lb

## 2024-04-04 DIAGNOSIS — Z6841 Body Mass Index (BMI) 40.0 and over, adult: Secondary | ICD-10-CM | POA: Diagnosis not present

## 2024-04-04 DIAGNOSIS — G4733 Obstructive sleep apnea (adult) (pediatric): Secondary | ICD-10-CM

## 2024-04-04 DIAGNOSIS — E291 Testicular hypofunction: Secondary | ICD-10-CM

## 2024-04-04 MED ORDER — ZEPBOUND 7.5 MG/0.5ML ~~LOC~~ SOAJ: 7.5000 mg | SUBCUTANEOUS | 0 refills | Status: DC

## 2024-04-04 MED ORDER — ZEPBOUND 7.5 MG/0.5ML ~~LOC~~ SOAJ: 7.5000 mg | SUBCUTANEOUS | 1 refills | Status: DC

## 2024-04-04 NOTE — Assessment & Plan Note (Signed)
 Patient has done well on CPAP and Zepbound  therapy for his OSA.  He is sleeping better with CPAP and his weight loss.  Will continue Zepbound  and increase dose up to 7.5mg  weekly.  Follow up on management at next appointment.

## 2024-04-04 NOTE — Telephone Encounter (Addendum)
 PA started, [employer] Zepbound  7.5mg 

## 2024-04-04 NOTE — Assessment & Plan Note (Signed)
 One testosterone  level below goal.  Needs 2 additional lab tests showing hypogonadism for insurance to pay for testosterone  supplementation.  He is scheduling appointment with PCP for this management.

## 2024-04-04 NOTE — Progress Notes (Signed)
 SUBJECTIVE:  Chief Complaint: Obesity  Interim History: Life has been busy with work demands.  He has been working more and has increased demands.  Lunches have been more on plan- he is packing his lunch.  He mentions that for breakfast he is still needing to get more consistently on plan.  Dinner tends to be relatively easy.  He is doing well on Zepbound  with no GI issues.    Dakota Hernandez is here to discuss his progress with his obesity treatment plan. He is on the Category 4 Plan and states he is following his eating plan approximately 60-65 % of the time. He states he is walking 1/2-1 mile on days off work.   OBJECTIVE: Visit Diagnoses: Problem List Items Addressed This Visit       Respiratory   OSA (obstructive sleep apnea) - Primary   Patient has done well on CPAP and Zepbound  therapy for his OSA.  He is sleeping better with CPAP and his weight loss.  Will continue Zepbound  and increase dose up to 7.5mg  weekly.  Follow up on management at next appointment.        Endocrine   Hypogonadism male   One testosterone  level below goal.  Needs 2 additional lab tests showing hypogonadism for insurance to pay for testosterone  supplementation.  He is scheduling appointment with PCP for this management.        Other   OBESITY, MORBID   Relevant Medications   tirzepatide  (ZEPBOUND ) 7.5 MG/0.5ML Pen   Other Visit Diagnoses       BMI 40.0-44.9, adult (HCC)       Relevant Medications   tirzepatide  (ZEPBOUND ) 7.5 MG/0.5ML Pen       Vitals Temp: 97.8 F (36.6 C) BP: 110/73 Pulse Rate: 85 SpO2: 97 %   Anthropometric Measurements Height: 5' 10 (1.778 m) Weight: 288 lb (130.6 kg) BMI (Calculated): 41.32 Weight at Last Visit: 293 lb Weight Lost Since Last Visit: 5 lb Starting Weight: 382 lb Total Weight Loss (lbs): 94 lb (42.6 kg)   Body Composition  Body Fat %: 41.5 % Fat Mass (lbs): 119.6 lbs Muscle Mass (lbs): 160.6 lbs Visceral Fat Rating : 26   Other Clinical  Data Today's Visit #: 62 Starting Date: 01/28/21 Comments: Cat 4     ASSESSMENT AND PLAN:  Diet: Dakota Hernandez is currently in the action stage of change. As such, his goal is to continue with weight loss efforts and has agreed to the Category 4 Plan.  Patient wants to decrease frequency of eating hot dogs and stop nighttime snacking.  He also wants to get more nutritious food for breakfast.   Exercise:  For substantial health benefits, adults should do at least 150 minutes (2 hours and 30 minutes) a week of moderate-intensity, or 75 minutes (1 hour and 15 minutes) a week of vigorous-intensity aerobic physical activity, or an equivalent combination of moderate- and vigorous-intensity aerobic activity. Aerobic activity should be performed in episodes of at least 10 minutes, and preferably, it should be spread throughout the week.  Behavior Modification:  We discussed the following Behavioral Modification Strategies today: increasing lean protein intake, decreasing simple carbohydrates, increasing vegetables, decreasing eating out, and planning for success.   Return in about 6 weeks (around 05/16/2024).   He was informed of the importance of frequent follow up visits to maximize his success with intensive lifestyle modifications for his multiple health conditions.  Attestation Statements:   Reviewed by clinician on day of visit: allergies, medications, problem list,  medical history, surgical history, family history, social history, and previous encounter notes.     Dakota Cho, MD

## 2024-04-12 NOTE — Telephone Encounter (Signed)
 PA for Zepbound  7.5MG  has been denied. PA is now complete.     Message from Plan PA Case: [id number], Status: Denied. Notification: Completed.

## 2024-04-13 ENCOUNTER — Encounter (INDEPENDENT_AMBULATORY_CARE_PROVIDER_SITE_OTHER): Payer: Self-pay | Admitting: Family Medicine

## 2024-04-18 ENCOUNTER — Other Ambulatory Visit: Payer: Self-pay | Admitting: Cardiology

## 2024-04-18 DIAGNOSIS — I4891 Unspecified atrial fibrillation: Secondary | ICD-10-CM

## 2024-04-18 NOTE — Telephone Encounter (Signed)
 Prescription refill request for Eliquis  received. Indication:afib Last office visit:1/25 Scr:1.06  2/25 Age: 58 Weight:130.6  kg  Prescription refilled

## 2024-05-02 NOTE — Progress Notes (Signed)
 This encounter was created in error - please disregard.  This encounter was created in error - please disregard.

## 2024-05-09 ENCOUNTER — Encounter (INDEPENDENT_AMBULATORY_CARE_PROVIDER_SITE_OTHER): Payer: Self-pay | Admitting: Family Medicine

## 2024-05-09 ENCOUNTER — Ambulatory Visit (INDEPENDENT_AMBULATORY_CARE_PROVIDER_SITE_OTHER): Admitting: Family Medicine

## 2024-05-09 VITALS — BP 124/84 | HR 68 | Temp 97.8°F | Ht 70.0 in | Wt 291.0 lb

## 2024-05-09 DIAGNOSIS — E349 Endocrine disorder, unspecified: Secondary | ICD-10-CM

## 2024-05-09 DIAGNOSIS — Z6841 Body Mass Index (BMI) 40.0 and over, adult: Secondary | ICD-10-CM

## 2024-05-09 NOTE — Progress Notes (Unsigned)
 SUBJECTIVE:  Chief Complaint: Obesity  Interim History: Since patient last appointment patient got letter stating that zepbound  would no longer be covered and he mentioned he went off the rails.  He has been more consistent now with his intake.  He voices that he can tell that he no longer has the drug in his system.  He has been mostly working otherwise and finally has a few days off coming up.  Breakfast has been a shake with a pack of crackers and lunch has been on plan.  Dinner has been baked chicken or [employer] nuggets with green beans.  Dakota Hernandez is here to discuss his progress with his obesity treatment plan. He is on the Category 4 Plan and states he is following his eating plan approximately 50 % of the time. He states he is walking at work and walking 1 mile 2 times per week.   OBJECTIVE: Visit Diagnoses: Problem List Items Addressed This Visit       Other   OBESITY, MORBID   Patient mentions Zepbound  no longer covered.  He started medication on 11/15/23 at weight of 330lbs.  Current weight is 291lbs with is a weight loss of 39lbs which is greater than the demanded 5% for approval (currently at 12% loss on zepbound ).   He is focusing on protein intake and mindful calorie consumption once daily protein goal is met.      Testosterone  deficiency - Primary   Other Visit Diagnoses       BMI 40.0-44.9, adult (HCC)           Vitals Temp: 97.8 F (36.6 C) BP: 124/84 Pulse Rate: 68 SpO2: 97 %   Anthropometric Measurements Height: 5' 10 (1.778 m) Weight: 291 lb (132 kg) BMI (Calculated): 41.75 Weight at Last Visit: 288 lb Weight Lost Since Last Visit: 0 Weight Gained Since Last Visit: 3 Starting Weight: 382 lb Total Weight Loss (lbs): 91 lb (41.3 kg)   Body Composition  Body Fat %: 41.9 % Fat Mass (lbs): 122.2 lbs Muscle Mass (lbs): 161 lbs Visceral Fat Rating : 26   Other Clinical Data Today's Visit #: 73 Starting Date: 01/28/21 Comments: Cat  4     ASSESSMENT AND PLAN: Assessment & Plan Testosterone  deficiency Patient to reach out to Dr. Alexandra office for further evaluation and treatment of his testosterone  deficiency.  He also will reach out to urology to see new patient paperwork that needs to be out prior to his appointment within the next few weeks. OBESITY, MORBID Patient mentions Zepbound  no longer covered.  He started medication on 11/15/23 at weight of 330lbs.  Current weight is 291lbs with is a weight loss of 39lbs which is greater than the demanded 5% for approval (currently at 12% loss on zepbound ).   He is focusing on protein intake and mindful calorie consumption once daily protein goal is met. BMI 40.0-44.9, adult (HCC)    Diet: [place] is currently in the action stage of change. As such, his goal is to continue with weight loss efforts and has agreed to the Category 4 Plan.   Exercise:  For substantial health benefits, adults should do at least 150 minutes (2 hours and 30 minutes) a week of moderate-intensity, or 75 minutes (1 hour and 15 minutes) a week of vigorous-intensity aerobic physical activity, or an equivalent combination of moderate- and vigorous-intensity aerobic activity. Aerobic activity should be performed in episodes of at least 10 minutes, and preferably, it should be spread throughout the week.  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  Behavior Modification:  We discussed the following Behavioral Modification Strategies today: increasing lean protein intake, decreasing simple carbohydrates, increasing vegetables, meal planning and cooking strategies, and planning for success. We discussed various medication options to help [place] with his weight loss efforts and we both agreed to appeal decision for Zepbound  coverage.  Return in about 4 weeks (around 06/06/2024).   He was informed of the importance of frequent follow up visits to maximize his success with  intensive lifestyle modifications for his multiple health conditions.  Attestation Statements:   Reviewed by clinician on day of visit: allergies, medications, problem list, medical history, surgical history, family history, social history, and previous encounter notes.   Adelita Cho, MD

## 2024-05-09 NOTE — Assessment & Plan Note (Addendum)
 Patient mentions Zepbound  no longer covered.  He started medication on 11/15/23 at weight of 330lbs.  Current weight is 291lbs with is a weight loss of 39lbs which is greater than the demanded 5% for approval (currently at 12% loss on zepbound ).   He is focusing on protein intake and mindful calorie consumption once daily protein goal is met.

## 2024-05-10 NOTE — Assessment & Plan Note (Signed)
 Patient to reach out to Dr. Alexandra office for further evaluation and treatment of his testosterone  deficiency.  He also will reach out to urology to see new patient paperwork that needs to be out prior to his appointment within the next few weeks.

## 2024-06-12 ENCOUNTER — Encounter (INDEPENDENT_AMBULATORY_CARE_PROVIDER_SITE_OTHER): Payer: Self-pay | Admitting: Family Medicine

## 2024-06-12 ENCOUNTER — Ambulatory Visit (INDEPENDENT_AMBULATORY_CARE_PROVIDER_SITE_OTHER): Admitting: Family Medicine

## 2024-06-12 VITALS — BP 120/81 | HR 77 | Temp 97.8°F | Ht 70.0 in | Wt 295.0 lb

## 2024-06-12 DIAGNOSIS — E291 Testicular hypofunction: Secondary | ICD-10-CM

## 2024-06-12 DIAGNOSIS — Z6841 Body Mass Index (BMI) 40.0 and over, adult: Secondary | ICD-10-CM

## 2024-06-12 DIAGNOSIS — G4733 Obstructive sleep apnea (adult) (pediatric): Secondary | ICD-10-CM

## 2024-06-12 NOTE — Progress Notes (Unsigned)
 SUBJECTIVE:  Chief Complaint: Obesity  Interim History: Patient is still struggling with getting nutritious food at breakfast.  He voices that he can eat on plan for lunch and dinner but breakfast and snacking remain a challenge.  Occasionally he will eat a piece of [place] in the am then a granola bar around 9:30/10.  Granola bar is oat and honey.  Patient spent time with his brother, sister in law and her mother over the last weekend for his birthday.  He is off work on Friday for a week and he may go somewhere but he isn't sure.   [place] is here to discuss his progress with his obesity treatment plan. He is on the Category 4 Plan and states he is following his eating plan approximately 65-70 % of the time. He states he is walking 1/2-1 mile 2-3 times per week.   OBJECTIVE: Visit Diagnoses: Problem List Items Addressed This Visit       Respiratory   OSA (obstructive sleep apnea) - Primary     Endocrine   Hypogonadism male     Other   OBESITY, MORBID   Other Visit Diagnoses       BMI 40.0-44.9, adult (HCC)           Vitals Temp: 97.8 F (36.6 C) BP: 120/81 Pulse Rate: 77 SpO2: 98 %   Anthropometric Measurements Height: 5' 10 (1.778 m) Weight: 295 lb (133.8 kg) BMI (Calculated): 42.33 Weight at Last Visit: 291 lb Weight Lost Since Last Visit: 0 Weight Gained Since Last Visit: 4 lb Starting Weight: 382 lb Total Weight Loss (lbs): 87 lb (39.5 kg)   Body Composition  Body Fat %: 42.3 % Fat Mass (lbs): 124.8 lbs Muscle Mass (lbs): 162 lbs Visceral Fat Rating : 27   Other Clinical Data Today's Visit #: 40 Starting Date: 01/28/21 Comments: Cat 4     ASSESSMENT AND PLAN: Assessment & Plan OSA (obstructive sleep apnea) Patient voices he was initially denied for Zepbound  coverage for OSA.  He filed an appeal and has not heard anything since that time.  Appeal file happened 1 to 2 days after last appointment which was on May 09, 2024.  Patient to look  into appeals and figure out whether or not he was truly denied.  No notification sent to our office on approval or denial.  Patient is still using CPAP nightly with improvement in sleep quality. Hypogonadism male Patient needs to follow up with his PCP for evaluation and management of his testosterone  deficiency.  He mentions that he knows he needs to make an appointment.  Will need to see Dr. Garald to get evaluated and back on hormonal supplementation if necessary.  OBESITY, MORBID  BMI 40.0-44.9, adult (HCC)    Diet: [place] is currently in the action stage of change. As such, his goal is to continue with weight loss efforts and has agreed to the Category 4 Plan.   Exercise:  For substantial health benefits, adults should do at least 150 minutes (2 hours and 30 minutes) a week of moderate-intensity, or 75 minutes (1 hour and 15 minutes) a week of vigorous-intensity aerobic physical activity, or an equivalent combination of moderate- and vigorous-intensity aerobic activity. Aerobic activity should be performed in episodes of at least 10 minutes, and preferably, it should be spread throughout the week.  Behavior Modification:  We discussed the following Behavioral Modification Strategies today: increasing lean protein intake, decreasing simple carbohydrates, increasing vegetables, meal planning and cooking strategies, keeping healthy  foods in the home, and planning for success.   Return in about 6 weeks (around 07/24/2024) for fasting labs.   He was informed of the importance of frequent follow up visits to maximize his success with intensive lifestyle modifications for his multiple health conditions.  Attestation Statements:   Reviewed by clinician on day of visit: allergies, medications, problem list, medical history, surgical history, family history, social history, and previous encounter notes.     Adelita Cho, MD

## 2024-06-12 NOTE — Assessment & Plan Note (Signed)
 Patient needs to follow up with his PCP for evaluation and management of his testosterone  deficiency.  He mentions that he knows he needs to make an appointment.  Will need to see Dr. Garald to get evaluated and back on hormonal supplementation if necessary.

## 2024-06-13 NOTE — Assessment & Plan Note (Signed)
 Patient voices he was initially denied for Zepbound  coverage for OSA.  He filed an appeal and has not heard anything since that time.  Appeal file happened 1 to 2 days after last appointment which was on May 09, 2024.  Patient to look into appeals and figure out whether or not he was truly denied.  No notification sent to our office on approval or denial.  Patient is still using CPAP nightly with improvement in sleep quality.

## 2024-06-26 ENCOUNTER — Other Ambulatory Visit: Payer: Self-pay | Admitting: Cardiology

## 2024-06-26 DIAGNOSIS — R351 Nocturia: Secondary | ICD-10-CM | POA: Diagnosis not present

## 2024-06-26 DIAGNOSIS — G4733 Obstructive sleep apnea (adult) (pediatric): Secondary | ICD-10-CM

## 2024-06-26 DIAGNOSIS — N401 Enlarged prostate with lower urinary tract symptoms: Secondary | ICD-10-CM | POA: Diagnosis not present

## 2024-06-26 DIAGNOSIS — I4891 Unspecified atrial fibrillation: Secondary | ICD-10-CM

## 2024-06-26 DIAGNOSIS — I4819 Other persistent atrial fibrillation: Secondary | ICD-10-CM

## 2024-06-26 DIAGNOSIS — R601 Generalized edema: Secondary | ICD-10-CM

## 2024-06-26 DIAGNOSIS — R3912 Poor urinary stream: Secondary | ICD-10-CM | POA: Diagnosis not present

## 2024-07-25 ENCOUNTER — Ambulatory Visit (INDEPENDENT_AMBULATORY_CARE_PROVIDER_SITE_OTHER): Admitting: Family Medicine

## 2024-07-25 ENCOUNTER — Encounter (INDEPENDENT_AMBULATORY_CARE_PROVIDER_SITE_OTHER): Payer: Self-pay | Admitting: Family Medicine

## 2024-07-25 VITALS — BP 137/86 | HR 72 | Temp 97.7°F | Ht 70.0 in | Wt 301.0 lb

## 2024-07-25 DIAGNOSIS — E559 Vitamin D deficiency, unspecified: Secondary | ICD-10-CM | POA: Diagnosis not present

## 2024-07-25 DIAGNOSIS — R7303 Prediabetes: Secondary | ICD-10-CM

## 2024-07-25 DIAGNOSIS — E291 Testicular hypofunction: Secondary | ICD-10-CM | POA: Diagnosis not present

## 2024-07-25 DIAGNOSIS — Z6841 Body Mass Index (BMI) 40.0 and over, adult: Secondary | ICD-10-CM

## 2024-07-25 DIAGNOSIS — E7849 Other hyperlipidemia: Secondary | ICD-10-CM

## 2024-07-25 NOTE — Progress Notes (Signed)
 SUBJECTIVE:  Chief Complaint: Obesity  Interim History: patient has been exhausted with work and demands at work.  He is feels he is in a funk.  Experiencing car insurance issues and health insurance challenges with medication coverage.  His zepbound  was denied by insurance. He is planning to go to the grocery store today and get more consistently on plan.  He has been eating out more than he would like.    Dakota Hernandez is here to discuss his progress with his obesity treatment plan. He is on the Category 4 Plan and states he is following his eating plan approximately 50 % of the time. He states he is working and walking 1 mile on days off.   OBJECTIVE: Visit Diagnoses: Problem List Items Addressed This Visit       Endocrine   Hypogonadism male   Relevant Orders   Testosterone ,Free and Total     Other   OBESITY, MORBID   Vitamin D  deficiency   Relevant Orders   VITAMIN D  25 Hydroxy (Vit-D Deficiency, Fractures)   Prediabetes - Primary   Relevant Orders   Comprehensive metabolic panel with GFR   Hemoglobin A1c   Insulin , random   HLD (hyperlipidemia)   Relevant Orders   Lipid Panel With LDL/HDL Ratio   Other Visit Diagnoses       BMI 40.0-44.9, adult (HCC)           Vitals Temp: 97.7 F (36.5 C) BP: 137/86 Pulse Rate: 72 SpO2: 97 %   Anthropometric Measurements Height: 5' 10 (1.778 m) Weight: (!) 301 lb (136.5 kg) BMI (Calculated): 43.19 Weight at Last Visit: 295 lb Weight Lost Since Last Visit: 0 Weight Gained Since Last Visit: 6 lb Starting Weight: 382 lb Total Weight Loss (lbs): 81 lb (36.7 kg)   Body Composition  Body Fat %: 42.7 % Fat Mass (lbs): 128.6 lbs Muscle Mass (lbs): 164.4 lbs Visceral Fat Rating : 28   Other Clinical Data Fasting: yes Labs: yes Today's Visit #: 41 Starting Date: 01/28/21 Comments: Cat 4     ASSESSMENT AND PLAN: Assessment & Plan Prediabetes Working on dietary changes by limiting simple carbohydrates.   Comprehensive metabolic panel, A1c, and insulin  level ordered today. Other hyperlipidemia Patient has cardiac history and follows along with cardiology and has an appointment that needs to be scheduled for his yearly follow-up.  Fasting lipid panel ordered today to assess response to cholesterol altering medications. Vitamin D  deficiency Previously on vitamin D  supplementation.  Patient endorses fatigue.  Will order vitamin D  level today. Hypogonadism male Patient to reach out to primary care physician for further treatment as well as evaluation of hypogonadism.  He reports his plan is to reach out to primary care for guidance as to treatment as his insurance had told him previously he needed to lab values showing testosterone  deficiency.  Total and free testosterone  levels ordered today. OBESITY, MORBID  BMI 40.0-44.9, adult (HCC)    Diet: Dakota Hernandez is currently in the action stage of change. As such, his goal is to continue with weight loss efforts and has agreed to the Category 4 Plan.   Exercise:  For substantial health benefits, adults should do at least 150 minutes (2 hours and 30 minutes) a week of moderate-intensity, or 75 minutes (1 hour and 15 minutes) a week of vigorous-intensity aerobic physical activity, or an equivalent combination of moderate- and vigorous-intensity aerobic activity. Aerobic activity should be performed in episodes of at least 10 minutes, and preferably, it should  be spread throughout the week. and For additional and more extensive health benefits, adults should increase their aerobic physical activity to 300 minutes (5 hours) a week of moderate-intensity, or 150 minutes a week of vigorous-intensity aerobic physical activity, or an equivalent combination of moderate- and vigorous-intensity activity. Additional health benefits are gained by engaging in physical activity beyond this amount.   Behavior Modification:  We discussed the following Behavioral Modification  Strategies today: increasing lean protein intake, decreasing simple carbohydrates, increasing vegetables, meal planning and cooking strategies, and planning for success.   Return in about 5 weeks (around 08/29/2024).   He was informed of the importance of frequent follow up visits to maximize his success with intensive lifestyle modifications for his multiple health conditions.  Attestation Statements:   Reviewed by clinician on day of visit: allergies, medications, problem list, medical history, surgical history, family history, social history, and previous encounter notes.    Adelita Cho, MD

## 2024-07-26 LAB — VITAMIN D 25 HYDROXY (VIT D DEFICIENCY, FRACTURES): Vit D, 25-Hydroxy: 76.5 ng/mL (ref 30.0–100.0)

## 2024-07-26 LAB — LIPID PANEL WITH LDL/HDL RATIO
Cholesterol, Total: 169 mg/dL (ref 100–199)
HDL: 55 mg/dL
LDL Chol Calc (NIH): 104 mg/dL — ABNORMAL HIGH (ref 0–99)
LDL/HDL Ratio: 1.9 ratio (ref 0.0–3.6)
Triglycerides: 47 mg/dL (ref 0–149)
VLDL Cholesterol Cal: 10 mg/dL (ref 5–40)

## 2024-07-26 LAB — COMPREHENSIVE METABOLIC PANEL WITH GFR
ALT: 8 IU/L (ref 0–44)
AST: 13 IU/L (ref 0–40)
Albumin: 4 g/dL (ref 3.8–4.9)
Alkaline Phosphatase: 80 IU/L (ref 47–123)
BUN/Creatinine Ratio: 14 (ref 9–20)
BUN: 14 mg/dL (ref 6–24)
Bilirubin Total: 1.1 mg/dL (ref 0.0–1.2)
CO2: 22 mmol/L (ref 20–29)
Calcium: 8.8 mg/dL (ref 8.7–10.2)
Chloride: 106 mmol/L (ref 96–106)
Creatinine, Ser: 0.99 mg/dL (ref 0.76–1.27)
Globulin, Total: 2.4 g/dL (ref 1.5–4.5)
Glucose: 87 mg/dL (ref 70–99)
Potassium: 4.6 mmol/L (ref 3.5–5.2)
Sodium: 142 mmol/L (ref 134–144)
Total Protein: 6.4 g/dL (ref 6.0–8.5)
eGFR: 89 mL/min/1.73

## 2024-07-26 LAB — TESTOSTERONE,FREE AND TOTAL
Testosterone, Free: 7.6 pg/mL (ref 7.2–24.0)
Testosterone: 335 ng/dL (ref 264–916)

## 2024-07-26 LAB — INSULIN, RANDOM: INSULIN: 6.7 u[IU]/mL (ref 2.6–24.9)

## 2024-07-26 LAB — HEMOGLOBIN A1C
Est. average glucose Bld gHb Est-mCnc: 114 mg/dL
Hgb A1c MFr Bld: 5.6 % (ref 4.8–5.6)

## 2024-08-02 NOTE — Assessment & Plan Note (Signed)
 Working on dietary changes by limiting simple carbohydrates.  Comprehensive metabolic panel, A1c, and insulin  level ordered today.

## 2024-08-02 NOTE — Assessment & Plan Note (Signed)
 Patient has cardiac history and follows along with cardiology and has an appointment that needs to be scheduled for his yearly follow-up.  Fasting lipid panel ordered today to assess response to cholesterol altering medications.

## 2024-08-02 NOTE — Assessment & Plan Note (Signed)
 Patient to reach out to primary care physician for further treatment as well as evaluation of hypogonadism.  He reports his plan is to reach out to primary care for guidance as to treatment as his insurance had told him previously he needed to lab values showing testosterone  deficiency.  Total and free testosterone  levels ordered today.

## 2024-08-02 NOTE — Assessment & Plan Note (Signed)
 Previously on vitamin D  supplementation.  Patient endorses fatigue.  Will order vitamin D  level today.

## 2024-08-22 ENCOUNTER — Ambulatory Visit (INDEPENDENT_AMBULATORY_CARE_PROVIDER_SITE_OTHER): Payer: Self-pay | Admitting: Family Medicine

## 2024-08-22 ENCOUNTER — Encounter (INDEPENDENT_AMBULATORY_CARE_PROVIDER_SITE_OTHER): Payer: Self-pay | Admitting: Family Medicine

## 2024-08-22 VITALS — BP 120/82 | HR 89 | Temp 98.2°F | Ht 70.0 in | Wt 300.0 lb

## 2024-08-22 DIAGNOSIS — E291 Testicular hypofunction: Secondary | ICD-10-CM | POA: Diagnosis not present

## 2024-08-22 DIAGNOSIS — Z6841 Body Mass Index (BMI) 40.0 and over, adult: Secondary | ICD-10-CM

## 2024-08-22 DIAGNOSIS — R7303 Prediabetes: Secondary | ICD-10-CM

## 2024-08-22 NOTE — Progress Notes (Signed)
 SUBJECTIVE:  Chief Complaint: Obesity  Interim History: Patient metions work has been fairly busy over the last few weeks due to holidays.  Feels that the past month has been better in terms of intake for dinner- ordering out less and eating more nutritious options at home.  Sometimes he is making chicken, zero sugar baked beans, rice and potatoes, but has green beans at home.  For Thanksgiving he will be going to his brother's house.  He has a couple days off coming up. Wants to do more chicken and less beef and hot dogs in the next few weeks. Not anticipating any obstacles in terms of consistency of cooking at home.  Dakota Hernandez is here to discuss his progress with his obesity treatment plan. He is on the Category 4 Plan and states he is following his eating plan approximately 70 % of the time. He states he is walking 1/2 mile 2 times per week.   OBJECTIVE: Visit Diagnoses: Problem List Items Addressed This Visit       Endocrine   Hypogonadism male     Other   OBESITY, MORBID   Prediabetes - Primary   Other Visit Diagnoses       BMI 40.0-44.9, adult (HCC)           Vitals Temp: 98.2 F (36.8 C) BP: 120/82 Pulse Rate: 89 SpO2: 98 %   Anthropometric Measurements Height: 5' 10 (1.778 m) Weight: 300 lb (136.1 kg) BMI (Calculated): 43.05 Weight at Last Visit: 301 lb Weight Lost Since Last Visit: 1 Weight Gained Since Last Visit: 0 Starting Weight: 382 lb Total Weight Loss (lbs): 82 lb (37.2 kg)   Body Composition  Body Fat %: 42.9 % Fat Mass (lbs): 129 lbs Muscle Mass (lbs): 163.4 lbs Visceral Fat Rating : 28   Other Clinical Data Today's Visit #: 80 Starting Date: 01/28/21 Comments: Cat 4     ASSESSMENT AND PLAN: Assessment & Plan Prediabetes Improved A1c from 6.0-5.6 on recent labs.  Patient had previously been on GLP-1 pharmacotherapy as insurance had covered it however insurance is no longer covering so patient is no longer on medication.  Will need  repeat labs in 4 to 6 months to evaluate A1c off pharmacotherapy. Hypogonadism male Recent testosterone  and free testosterone  labs drawn on October 21.  Both were within normal limits though free testosterone  was close to lower range of normal.  Patient to reach out to primary care for further guidance on treatment. BMI 40.0-44.9, adult (HCC)  OBESITY, MORBID    Diet: [place] is currently in the action stage of change. As such, his goal is to continue with weight loss efforts and has agreed to the Category 4 Plan.   Exercise:  For substantial health benefits, adults should do at least 150 minutes (2 hours and 30 minutes) a week of moderate-intensity, or 75 minutes (1 hour and 15 minutes) a week of vigorous-intensity aerobic physical activity, or an equivalent combination of moderate- and vigorous-intensity aerobic activity. Aerobic activity should be performed in episodes of at least 10 minutes, and preferably, it should be spread throughout the week.  Behavior Modification:  We discussed the following Behavioral Modification Strategies today: increasing lean protein intake, decreasing simple carbohydrates, increasing vegetables, meal planning and cooking strategies, and planning for success.   Return in about 7 weeks (around 10/10/2024).   He was informed of the importance of frequent follow up visits to maximize his success with intensive lifestyle modifications for his multiple health conditions.  Attestation  Statements:   Reviewed by clinician on day of visit: allergies, medications, problem list, medical history, surgical history, family history, social history, and previous encounter notes.     Adelita Cho, MD

## 2024-08-31 NOTE — Assessment & Plan Note (Signed)
 Recent testosterone  and free testosterone  labs drawn on October 21.  Both were within normal limits though free testosterone  was close to lower range of normal.  Patient to reach out to primary care for further guidance on treatment.

## 2024-08-31 NOTE — Assessment & Plan Note (Signed)
 Improved A1c from 6.0-5.6 on recent labs.  Patient had previously been on GLP-1 pharmacotherapy as insurance had covered it however insurance is no longer covering so patient is no longer on medication.  Will need repeat labs in 4 to 6 months to evaluate A1c off pharmacotherapy.

## 2024-09-08 ENCOUNTER — Encounter: Payer: Self-pay | Admitting: Cardiology

## 2024-10-04 ENCOUNTER — Other Ambulatory Visit: Payer: Self-pay | Admitting: Cardiology

## 2024-10-04 DIAGNOSIS — I4891 Unspecified atrial fibrillation: Secondary | ICD-10-CM

## 2024-10-04 MED ORDER — APIXABAN 5 MG PO TABS: 5.0000 mg | ORAL_TABLET | Freq: Two times a day (BID) | ORAL | 5 refills | Status: AC

## 2024-10-04 NOTE — Telephone Encounter (Signed)
 Prescription refill request for Eliquis  received. Indication:afib Last office visit:1/25 Scr: 0.99  10/25 Age:58 Weight:136.1  kg  Prescription refilled

## 2024-10-10 ENCOUNTER — Ambulatory Visit (INDEPENDENT_AMBULATORY_CARE_PROVIDER_SITE_OTHER): Admitting: Family Medicine

## 2024-10-10 ENCOUNTER — Encounter (INDEPENDENT_AMBULATORY_CARE_PROVIDER_SITE_OTHER): Payer: Self-pay | Admitting: Family Medicine

## 2024-10-10 VITALS — BP 106/69 | HR 68 | Temp 97.6°F | Ht 70.0 in | Wt 297.0 lb

## 2024-10-10 DIAGNOSIS — Z6841 Body Mass Index (BMI) 40.0 and over, adult: Secondary | ICD-10-CM

## 2024-10-10 DIAGNOSIS — R7303 Prediabetes: Secondary | ICD-10-CM | POA: Diagnosis not present

## 2024-10-10 DIAGNOSIS — G4733 Obstructive sleep apnea (adult) (pediatric): Secondary | ICD-10-CM | POA: Diagnosis not present

## 2024-10-10 NOTE — Progress Notes (Signed)
 "  SUBJECTIVE:  Chief Complaint: Obesity  Interim History: Patient had a good holiday season.  Work is increasing in demands and expectations. He has been trying to stay mindful of intake and mentions he does not think he did as well as he could on plan.  He was ordering out more or eating less nutritious stuff than he should be eating.  He mentions he is looking at prices and sales to determine some of the food he buys at the grocery store.  His insurance does not renew to middle of the year.  He is getting 2 days off weekly.    Dakota Hernandez is here to discuss his progress with his obesity treatment plan. He is on the Category 4 Plan and states he is following his eating plan approximately 50 % of the time. He states he is working a lot and walking when he can.   OBJECTIVE: Visit Diagnoses: Problem List Items Addressed This Visit       Respiratory   OSA (obstructive sleep apnea) - Primary     Other   OBESITY, MORBID   Prediabetes   Other Visit Diagnoses       BMI 40.0-44.9, adult (HCC)           Vitals Temp: 97.6 F (36.4 C) BP: 106/69 Pulse Rate: 68 SpO2: 98 %   Anthropometric Measurements Height: 5' 10 (1.778 m) Weight: 297 lb (134.7 kg) BMI (Calculated): 42.61 Weight at Last Visit: 300 lb Weight Lost Since Last Visit: 3 Weight Gained Since Last Visit: 0 Starting Weight: 382 lb Total Weight Loss (lbs): 85 lb (38.6 kg)   Body Composition  Body Fat %: 41.7 % Fat Mass (lbs): 124 lbs Muscle Mass (lbs): 165 lbs Visceral Fat Rating : 27   Other Clinical Data Today's Visit #: 66 Starting Date: 01/28/21 Comments: Cat 4     ASSESSMENT AND PLAN: Assessment & Plan Prediabetes Recent A1c improved to 5.6 from 6.0.  He was on GLP1 therapy at that time so will need repeat lab to be done in March to re-evaluate prediabetes management.  OSA (obstructive sleep apnea) Mentions he has not been as compliant with his CPAP as he was previously.  Voices he know he needs to  get this cleaned up to get back on consistent CPAP usage.  Will try to get authorization for Zepbound  when insurance renews mid year. OBESITY, MORBID  BMI 40.0-44.9, adult (HCC)    Diet: Dakota Hernandez is currently in the action stage of change. As such, his goal is to continue with weight loss efforts and has agreed to the Category 4 Plan.   Exercise:  For substantial health benefits, adults should do at least 150 minutes (2 hours and 30 minutes) a week of moderate-intensity, or 75 minutes (1 hour and 15 minutes) a week of vigorous-intensity aerobic physical activity, or an equivalent combination of moderate- and vigorous-intensity aerobic activity. Aerobic activity should be performed in episodes of at least 10 minutes, and preferably, it should be spread throughout the week.  Behavior Modification:  We discussed the following Behavioral Modification Strategies today: increasing lean protein intake, decreasing simple carbohydrates, meal planning and cooking strategies, keeping healthy foods in the home, and planning for success.   Return in about 7 weeks (around 11/28/2024).   He was informed of the importance of frequent follow up visits to maximize his success with intensive lifestyle modifications for his multiple health conditions.  Attestation Statements:   Reviewed by clinician on day of visit: allergies,  medications, problem list, medical history, surgical history, family history, social history, and previous encounter notes.    Dakota Cho, MD "

## 2024-10-10 NOTE — Assessment & Plan Note (Signed)
 Recent A1c improved to 5.6 from 6.0.  He was on GLP1 therapy at that time so will need repeat lab to be done in March to re-evaluate prediabetes management.

## 2024-10-10 NOTE — Assessment & Plan Note (Signed)
 Mentions he has not been as compliant with his CPAP as he was previously.  Voices he know he needs to get this cleaned up to get back on consistent CPAP usage.  Will try to get authorization for Zepbound  when insurance renews mid year.

## 2024-10-11 ENCOUNTER — Encounter: Payer: Self-pay | Admitting: Cardiology

## 2024-10-31 ENCOUNTER — Ambulatory Visit: Admitting: Cardiology

## 2024-10-31 ENCOUNTER — Encounter: Payer: Self-pay | Admitting: Cardiology

## 2024-10-31 VITALS — BP 122/86 | HR 81 | Ht 70.0 in | Wt 310.0 lb

## 2024-10-31 DIAGNOSIS — I4891 Unspecified atrial fibrillation: Secondary | ICD-10-CM | POA: Diagnosis not present

## 2024-10-31 DIAGNOSIS — I4819 Other persistent atrial fibrillation: Secondary | ICD-10-CM

## 2024-10-31 DIAGNOSIS — I10 Essential (primary) hypertension: Secondary | ICD-10-CM

## 2024-10-31 LAB — CBC
Hematocrit: 42.8 % (ref 37.5–51.0)
Hemoglobin: 14 g/dL (ref 13.0–17.7)
MCH: 28.7 pg (ref 26.6–33.0)
MCHC: 32.7 g/dL (ref 31.5–35.7)
MCV: 88 fL (ref 79–97)
Platelets: 193 10*3/uL (ref 150–450)
RBC: 4.87 x10E6/uL (ref 4.14–5.80)
RDW: 14.1 % (ref 11.6–15.4)
WBC: 6.4 10*3/uL (ref 3.4–10.8)

## 2024-10-31 NOTE — Progress Notes (Signed)
 "    HPI: FU atrial fibrillation.  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  He has had previous cardioversions but did not hold sinus rhythm. Pt is now treated with rate control and anticoagulation as he is asymptomatic. CTA 7/21 showed no pulmonary embolus. Echocardiogram January 2025 showed normal LV function, mild left ventricular hypertrophy, mild left atrial enlargement, mild mitral regurgitation.  Since last seen, patient denies dyspnea, chest pain, palpitations, syncope or bleeding.  Current Outpatient Medications  Medication Sig Dispense Refill   apixaban  (ELIQUIS ) 5 MG TABS tablet Take 1 tablet (5 mg total) by mouth 2 (two) times daily. 60 tablet 5   cholecalciferol (VITAMIN D3) 25 MCG (1000 UNIT) tablet Take 1,000 Units by mouth daily.     diltiazem  (CARDIZEM  CD) 120 MG 24 hr capsule TAKE 1 CAPSULE(120 MG) BY MOUTH DAILY 90 capsule 1   furosemide  (LASIX ) 20 MG tablet TAKE 1 TABLET(20 MG) BY MOUTH DAILY 90 tablet 1   metoprolol  (TOPROL -XL) 200 MG 24 hr tablet TAKE 1 TABLET(200 MG) BY MOUTH DAILY 90 tablet 1   omeprazole (PRILOSEC OTC) 20 MG tablet Take 20 mg by mouth daily before breakfast.      tamsulosin (FLOMAX) 0.4 MG CAPS capsule Take 0.4 mg by mouth at bedtime.     vitamin B-12 (CYANOCOBALAMIN ) 1000 MCG tablet Take 1 tablet (1,000 mcg total) by mouth daily. 90 tablet 0   No current facility-administered medications for this visit.     Past Medical History:  Diagnosis Date   Arthritis    Atrial fibrillation (HCC)    a. s/p multiple DCCV's ---> now rate-controlled. On Eliquis  for anticoagulation   Cardiomyopathy (HCC)    a. EF previously at 25-30% (Tachycardia-mediated?) --> at 55-60% by echo in 03/2016   Cellulitis and abscess of right leg    Diabetes mellitus without complication (HCC)    Dysrhythmia    ED (erectile dysfunction)    Edema of knee    Hypertension    Hypogonadism male    Joint pain     Leg cramp    Muscle pain    Obesity    Obesity    OSA (obstructive sleep apnea)    a. on CPAP   Other fatigue    Pre-diabetes    SOB (shortness of breath)    SOB (shortness of breath) on exertion    Vitamin B12 deficiency    Vitamin D  deficiency     Past Surgical History:  Procedure Laterality Date   APPENDECTOMY  1981   CARDIOVERSION N/A 03/24/2013   Procedure: CARDIOVERSION;  Surgeon: Redell GORMAN Shallow, MD;  Location: [facility] ENDOSCOPY;  Service: Cardiovascular;  Laterality: N/A;   EYE SURGERY  in 1st grade   Left eye   LAPAROSCOPIC GASTRIC SLEEVE RESECTION  8/13   XI ROBOTIC ASSISTED INGUINAL HERNIA REPAIR WITH MESH Right 12/08/2021   Procedure: XI ROBOTIC REPAIR OF RIGHT INCARCERATED INGUINAL HERNIA WITH MESH;  Surgeon: Tanda Locus, MD;  Location: [facility] ORS;  Service: General;  Laterality: Right;    Social History   Socioeconomic History   Marital status: Divorced    Spouse name: [name]   Number of children: N   Years of education: Not on file   Highest education level: Not on file  Occupational History   Occupation: Customer Service Manager: [employer]    Employer: [employer]  Tobacco Use   Smoking status: Never   Smokeless tobacco: Never  Vaping Use  Vaping status: Never Used  Substance and Sexual Activity   Alcohol use: No   Drug use: No   Sexual activity: Yes  Other Topics Concern   Not on file  Social History Narrative   Got married 08/2010      Regular Exercise- yes         Social Drivers of Health   Tobacco Use: Low Risk (10/31/2024)   Patient History    Smoking Tobacco Use: Never    Smokeless Tobacco Use: Never    Passive Exposure: Not on file  Financial Resource Strain: Not on file  Food Insecurity: Not on file  Transportation Needs: Not on file  Physical Activity: Not on file  Stress: Not on file  Social Connections: Not on file  Intimate Partner Violence: Not on file  Depression (PHQ2-9): Low Risk (08/05/2023)   Depression (PHQ2-9)     PHQ-2 Score: 0  Alcohol Screen: Not on file  Housing: Not on file  Utilities: Not on file  Health Literacy: Not on file    Family History  Problem Relation Age of Onset   Heart attack Father 73   Heart disease Father 37   Hypertension Father    Heart failure Father    Sudden death Father    Heart attack Other        cousin   Heart disease Other    Stroke Mother    Obesity Mother     ROS: no fevers or chills, productive cough, hemoptysis, dysphasia, odynophagia, melena, hematochezia, dysuria, hematuria, rash, seizure activity, orthopnea, PND, pedal edema, claudication. Remaining systems are negative.  Physical Exam: Well-developed obese in no acute distress.  Skin is warm and dry.  HEENT is normal.  Neck is supple.  Chest is clear to auscultation with normal expansion.  Cardiovascular exam is irregular Abdominal exam nontender or distended. No masses palpated. Extremities show no edema. neuro grossly intact  EKG Interpretation Date/Time:  Tuesday October 31 2024 15:01:36 EST Ventricular Rate:  81 PR Interval:    QRS Duration:  76 QT Interval:  384 QTC Calculation: 446 R Axis:   1  Text Interpretation: Atrial fibrillation Confirmed by Pietro Rogue ([id number]) on 10/31/2024 3:14:39 PM    A/P  1 permanent atrial fibrillation-continue Toprol  and Cardizem  for rate control.  Continue apixaban .  Check hemoglobin.  2 history of cardiomyopathy-felt likely to be tachycardia mediated.  LV function has normalized on most recent echocardiogram.  3 hypertension-patient's blood pressure is controlled.  Continue present medications.  4 morbid obesity-we discussed the importance of weight loss.  5 obstructive sleep apnea-continue CPAP.  6 history of lower extremity edema-reasonably well-controlled.  Continue diuretic at present dose.  Rogue Pietro, MD    "

## 2024-10-31 NOTE — Patient Instructions (Signed)
 Medication Instructions:  No changes *If you need a refill on your cardiac medications before your next appointment, please call your pharmacy*  Lab Work: CBC If you have labs (blood work) drawn today and your tests are completely normal, you will receive your results only by: MyChart Message (if you have MyChart) OR A paper copy in the mail If you have any lab test that is abnormal or we need to change your treatment, we will call you to review the results.  Testing/Procedures: None ordered  Follow-Up: At [facility], you and your health needs are our priority.  As part of our continuing mission to provide you with exceptional heart care, our providers are all part of one team.  This team includes your primary Cardiologist (physician) and Advanced Practice Providers or APPs (Physician Assistants and Nurse Practitioners) who all work together to provide you with the care you need, when you need it.  Your next appointment:   1 year(s)  Provider:   Redell Shallow, MD    We recommend signing up for the patient portal called MyChart.  Sign up information is provided on this After Visit Summary.  MyChart is used to connect with patients for Virtual Visits (Telemedicine).  Patients are able to view lab/test results, encounter notes, upcoming appointments, etc.  Non-urgent messages can be sent to your provider as well.   To learn more about what you can do with MyChart, go to [email or web address].

## 2024-11-01 ENCOUNTER — Ambulatory Visit: Payer: Self-pay | Admitting: Cardiology

## 2024-11-01 NOTE — Telephone Encounter (Signed)
 Letter of results sent to pt

## 2024-11-08 ENCOUNTER — Encounter: Payer: Self-pay | Admitting: Cardiology

## 2024-11-08 DIAGNOSIS — G4733 Obstructive sleep apnea (adult) (pediatric): Secondary | ICD-10-CM

## 2024-11-08 DIAGNOSIS — I4891 Unspecified atrial fibrillation: Secondary | ICD-10-CM

## 2024-11-08 DIAGNOSIS — I4819 Other persistent atrial fibrillation: Secondary | ICD-10-CM

## 2024-11-08 DIAGNOSIS — R601 Generalized edema: Secondary | ICD-10-CM

## 2024-11-08 MED ORDER — FUROSEMIDE 20 MG PO TABS: 20.0000 mg | ORAL_TABLET | Freq: Every day | ORAL | 3 refills | Status: AC

## 2024-11-08 MED ORDER — METOPROLOL SUCCINATE ER 200 MG PO TB24: 200.0000 mg | ORAL_TABLET | Freq: Every day | ORAL | 3 refills | Status: AC

## 2024-11-08 MED ORDER — DILTIAZEM HCL ER COATED BEADS 120 MG PO CP24: 120.0000 mg | ORAL_CAPSULE | Freq: Every day | ORAL | 3 refills | Status: AC

## 2024-12-07 ENCOUNTER — Ambulatory Visit (INDEPENDENT_AMBULATORY_CARE_PROVIDER_SITE_OTHER): Admitting: Family Medicine

## 2025-01-30 ENCOUNTER — Ambulatory Visit: Admitting: Cardiology
